# Patient Record
Sex: Female | Born: 1985 | Race: Black or African American | Hispanic: No | Marital: Single | Smoking: Former smoker
Health system: Southern US, Community
[De-identification: ages and names within clinical notes are randomized; demographics above are authoritative.]

## PROBLEM LIST (undated history)

## (undated) ENCOUNTER — Inpatient Hospital Stay (HOSPITAL_COMMUNITY): Payer: Self-pay

## (undated) DIAGNOSIS — I10 Essential (primary) hypertension: Secondary | ICD-10-CM

## (undated) DIAGNOSIS — N200 Calculus of kidney: Secondary | ICD-10-CM

## (undated) DIAGNOSIS — O4400 Placenta previa specified as without hemorrhage, unspecified trimester: Secondary | ICD-10-CM

---
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
## 2005-10-12 ENCOUNTER — Emergency Department (HOSPITAL_COMMUNITY): Admission: EM | Admit: 2005-10-12 | Discharge: 2005-10-12 | Payer: Self-pay | Admitting: Emergency Medicine

## 2006-02-02 ENCOUNTER — Ambulatory Visit (HOSPITAL_COMMUNITY): Admission: RE | Admit: 2006-02-02 | Discharge: 2006-02-02 | Payer: Self-pay | Admitting: Obstetrics & Gynecology

## 2006-02-21 ENCOUNTER — Ambulatory Visit (HOSPITAL_COMMUNITY): Admission: RE | Admit: 2006-02-21 | Discharge: 2006-02-21 | Payer: Self-pay | Admitting: Obstetrics & Gynecology

## 2006-03-19 ENCOUNTER — Emergency Department (HOSPITAL_COMMUNITY): Admission: EM | Admit: 2006-03-19 | Discharge: 2006-03-19 | Payer: Self-pay | Admitting: Emergency Medicine

## 2006-04-02 ENCOUNTER — Emergency Department (HOSPITAL_COMMUNITY): Admission: EM | Admit: 2006-04-02 | Discharge: 2006-04-02 | Payer: Self-pay | Admitting: Emergency Medicine

## 2006-07-10 ENCOUNTER — Ambulatory Visit: Payer: Self-pay | Admitting: *Deleted

## 2006-07-10 ENCOUNTER — Inpatient Hospital Stay (HOSPITAL_COMMUNITY): Admission: AD | Admit: 2006-07-10 | Discharge: 2006-07-10 | Payer: Self-pay | Admitting: Gynecology

## 2006-07-20 ENCOUNTER — Ambulatory Visit: Payer: Self-pay | Admitting: *Deleted

## 2006-07-20 ENCOUNTER — Inpatient Hospital Stay (HOSPITAL_COMMUNITY): Admission: RE | Admit: 2006-07-20 | Discharge: 2006-07-20 | Payer: Self-pay | Admitting: Obstetrics & Gynecology

## 2006-07-22 ENCOUNTER — Inpatient Hospital Stay (HOSPITAL_COMMUNITY): Admission: AD | Admit: 2006-07-22 | Discharge: 2006-07-22 | Payer: Self-pay | Admitting: Family Medicine

## 2006-07-22 ENCOUNTER — Ambulatory Visit: Payer: Self-pay | Admitting: Family Medicine

## 2006-07-23 ENCOUNTER — Inpatient Hospital Stay (HOSPITAL_COMMUNITY): Admission: AD | Admit: 2006-07-23 | Discharge: 2006-07-26 | Payer: Self-pay | Admitting: Family Medicine

## 2006-07-23 ENCOUNTER — Encounter (INDEPENDENT_AMBULATORY_CARE_PROVIDER_SITE_OTHER): Payer: Self-pay | Admitting: *Deleted

## 2006-07-23 ENCOUNTER — Ambulatory Visit: Payer: Self-pay | Admitting: Obstetrics and Gynecology

## 2006-12-27 ENCOUNTER — Emergency Department (HOSPITAL_COMMUNITY): Admission: EM | Admit: 2006-12-27 | Discharge: 2006-12-27 | Payer: Self-pay | Admitting: *Deleted

## 2007-11-20 ENCOUNTER — Emergency Department (HOSPITAL_COMMUNITY): Admission: EM | Admit: 2007-11-20 | Discharge: 2007-11-20 | Payer: Self-pay | Admitting: Emergency Medicine

## 2008-06-16 ENCOUNTER — Emergency Department (HOSPITAL_COMMUNITY): Admission: EM | Admit: 2008-06-16 | Discharge: 2008-06-16 | Payer: Self-pay | Admitting: Emergency Medicine

## 2008-07-22 ENCOUNTER — Emergency Department (HOSPITAL_COMMUNITY): Admission: EM | Admit: 2008-07-22 | Discharge: 2008-07-22 | Payer: Self-pay | Admitting: Family Medicine

## 2008-12-27 ENCOUNTER — Emergency Department (HOSPITAL_COMMUNITY): Admission: EM | Admit: 2008-12-27 | Discharge: 2008-12-27 | Payer: Self-pay | Admitting: Emergency Medicine

## 2009-03-26 ENCOUNTER — Emergency Department (HOSPITAL_COMMUNITY): Admission: EM | Admit: 2009-03-26 | Discharge: 2009-03-26 | Payer: Self-pay | Admitting: Emergency Medicine

## 2009-04-14 ENCOUNTER — Ambulatory Visit (HOSPITAL_COMMUNITY): Admission: RE | Admit: 2009-04-14 | Discharge: 2009-04-14 | Payer: Self-pay | Admitting: Obstetrics

## 2009-05-30 ENCOUNTER — Inpatient Hospital Stay (HOSPITAL_COMMUNITY): Admission: AD | Admit: 2009-05-30 | Discharge: 2009-05-31 | Payer: Self-pay | Admitting: Obstetrics

## 2009-05-31 ENCOUNTER — Ambulatory Visit: Payer: Self-pay | Admitting: Obstetrics and Gynecology

## 2009-05-31 ENCOUNTER — Inpatient Hospital Stay (HOSPITAL_COMMUNITY): Admission: AD | Admit: 2009-05-31 | Discharge: 2009-05-31 | Payer: Self-pay | Admitting: Obstetrics

## 2009-06-18 ENCOUNTER — Inpatient Hospital Stay (HOSPITAL_COMMUNITY): Admission: AD | Admit: 2009-06-18 | Discharge: 2009-06-18 | Payer: Self-pay | Admitting: Obstetrics

## 2009-07-15 ENCOUNTER — Ambulatory Visit (HOSPITAL_COMMUNITY): Admission: RE | Admit: 2009-07-15 | Discharge: 2009-07-15 | Payer: Self-pay | Admitting: Obstetrics

## 2009-08-11 ENCOUNTER — Inpatient Hospital Stay (HOSPITAL_COMMUNITY): Admission: AD | Admit: 2009-08-11 | Discharge: 2009-08-11 | Payer: Self-pay | Admitting: Obstetrics

## 2009-08-12 ENCOUNTER — Encounter (INDEPENDENT_AMBULATORY_CARE_PROVIDER_SITE_OTHER): Payer: Self-pay | Admitting: Obstetrics

## 2009-08-12 ENCOUNTER — Inpatient Hospital Stay (HOSPITAL_COMMUNITY): Admission: AD | Admit: 2009-08-12 | Discharge: 2009-08-14 | Payer: Self-pay | Admitting: Obstetrics

## 2009-09-29 ENCOUNTER — Emergency Department (HOSPITAL_COMMUNITY): Admission: EM | Admit: 2009-09-29 | Discharge: 2009-09-29 | Payer: Self-pay | Admitting: Family Medicine

## 2009-11-10 ENCOUNTER — Emergency Department (HOSPITAL_COMMUNITY): Admission: EM | Admit: 2009-11-10 | Discharge: 2009-11-10 | Payer: Self-pay | Admitting: Emergency Medicine

## 2010-04-25 DEATH — deceased

## 2010-07-13 LAB — CBC
HCT: 24.4 % — ABNORMAL LOW (ref 36.0–46.0)
Hemoglobin: 10 g/dL — ABNORMAL LOW (ref 12.0–15.0)
Hemoglobin: 8.1 g/dL — ABNORMAL LOW (ref 12.0–15.0)
MCHC: 33 g/dL (ref 30.0–36.0)
MCV: 83.7 fL (ref 78.0–100.0)
Platelets: 351 10*3/uL (ref 150–400)
RBC: 2.91 MIL/uL — ABNORMAL LOW (ref 3.87–5.11)
RBC: 3.77 MIL/uL — ABNORMAL LOW (ref 3.87–5.11)
RDW: 16.1 % — ABNORMAL HIGH (ref 11.5–15.5)
WBC: 10.3 10*3/uL (ref 4.0–10.5)

## 2010-07-14 LAB — URINALYSIS, ROUTINE W REFLEX MICROSCOPIC
Bilirubin Urine: NEGATIVE
Glucose, UA: 100 mg/dL — AB
Hgb urine dipstick: NEGATIVE
Ketones, ur: 15 mg/dL — AB
Ketones, ur: NEGATIVE mg/dL
Nitrite: NEGATIVE
Protein, ur: 30 mg/dL — AB
Protein, ur: NEGATIVE mg/dL
Specific Gravity, Urine: 1.02 (ref 1.005–1.030)

## 2010-07-14 LAB — URINE CULTURE: Colony Count: 60000

## 2010-07-14 LAB — URINE MICROSCOPIC-ADD ON

## 2010-07-14 LAB — WET PREP, GENITAL

## 2010-07-14 LAB — CBC
Hemoglobin: 9.7 g/dL — ABNORMAL LOW (ref 12.0–15.0)
MCHC: 32.2 g/dL (ref 30.0–36.0)
RBC: 3.34 MIL/uL — ABNORMAL LOW (ref 3.87–5.11)
WBC: 12.2 10*3/uL — ABNORMAL HIGH (ref 4.0–10.5)

## 2010-07-14 LAB — FETAL FIBRONECTIN: Fetal Fibronectin: NEGATIVE

## 2010-07-18 ENCOUNTER — Emergency Department (HOSPITAL_COMMUNITY)
Admission: EM | Admit: 2010-07-18 | Discharge: 2010-07-18 | Disposition: A | Discharge status: Home / Self Care | Payer: Self-pay | Attending: Emergency Medicine | Admitting: Emergency Medicine

## 2010-07-18 DIAGNOSIS — K029 Dental caries, unspecified: Secondary | ICD-10-CM | POA: Insufficient documentation

## 2010-07-18 DIAGNOSIS — K089 Disorder of teeth and supporting structures, unspecified: Secondary | ICD-10-CM | POA: Insufficient documentation

## 2010-07-29 IMAGING — US US OB FOLLOW-UP
1 series · 10 of 10 positions shown · non-contrast
Comparison: none

OBSTETRICAL ULTRASOUND:
 This ultrasound exam was performed in the [HOSPITAL] Ultrasound Department.  The OB US report was generated in the AS system, and faxed to the ordering physician.  This report is also available in [HOSPITAL]?s Access[employer] and in [REDACTED] PACS.

[Series 1: us ob follow-up · 0.21mm/px · 10 of 10 slices shown]
[im 1/10]
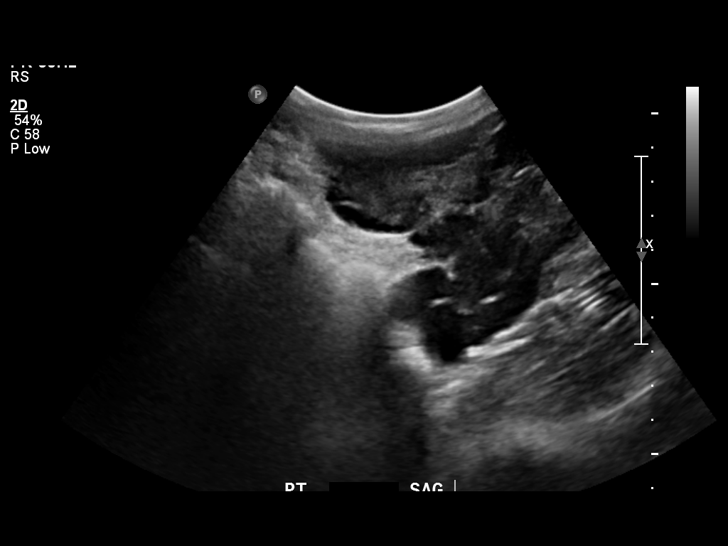
[im 2/10]
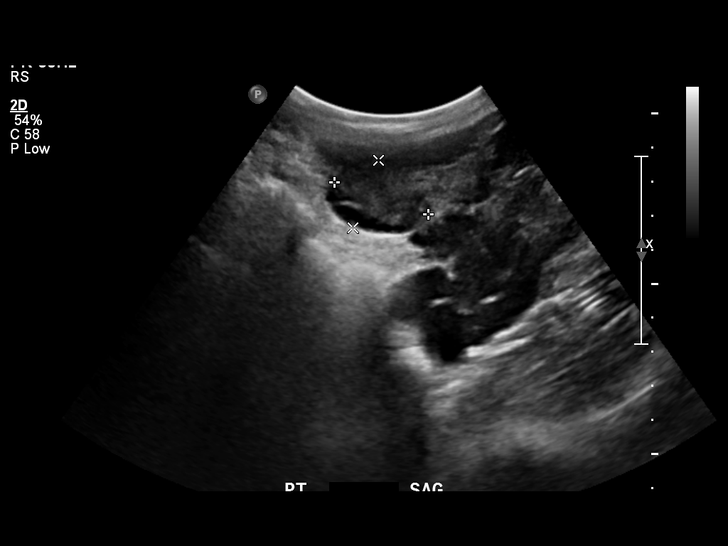
[im 3/10]
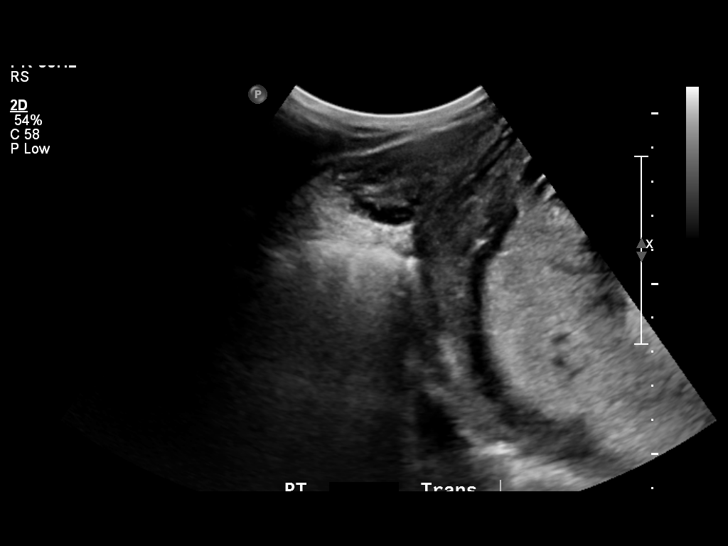
[im 4/10]
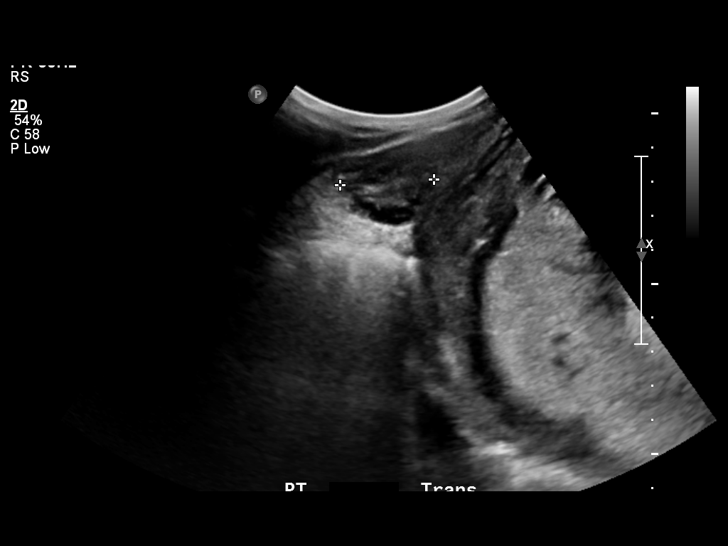
[im 5/10]
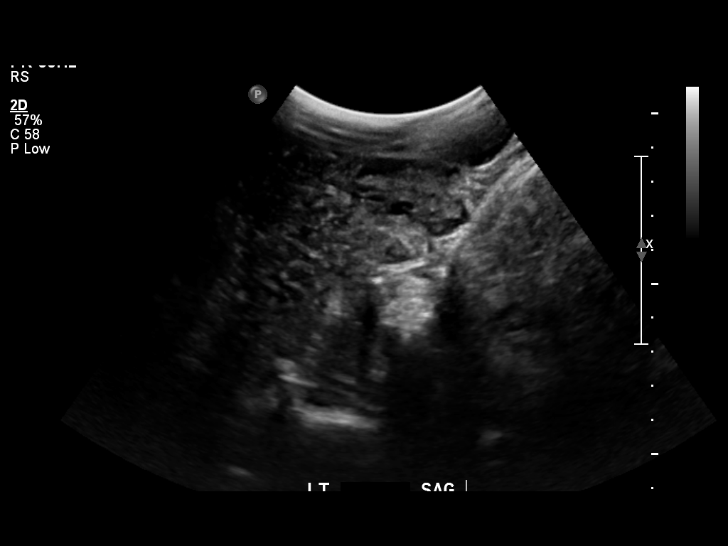
[im 6/10]
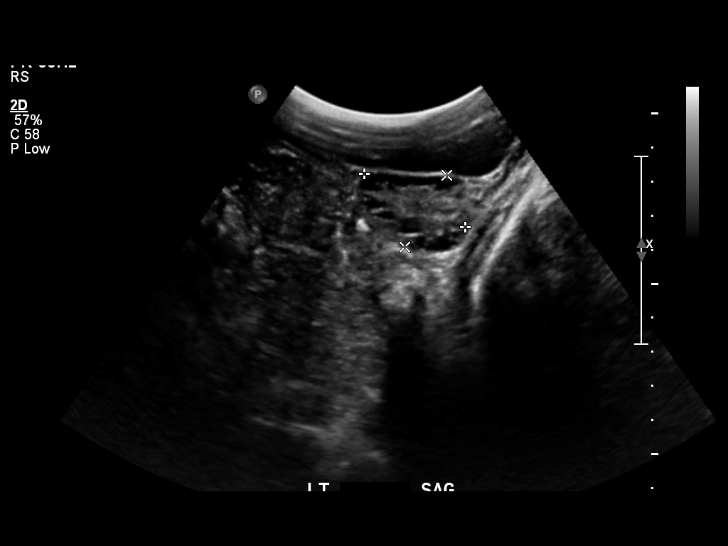
[im 7/10]
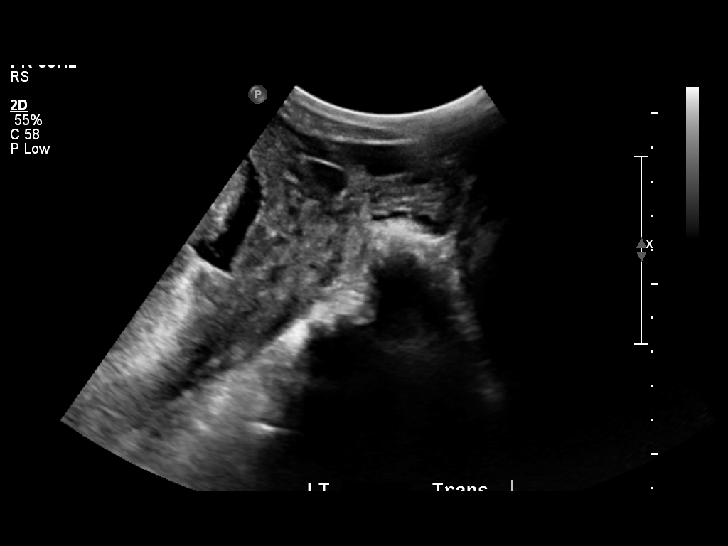
[im 8/10]
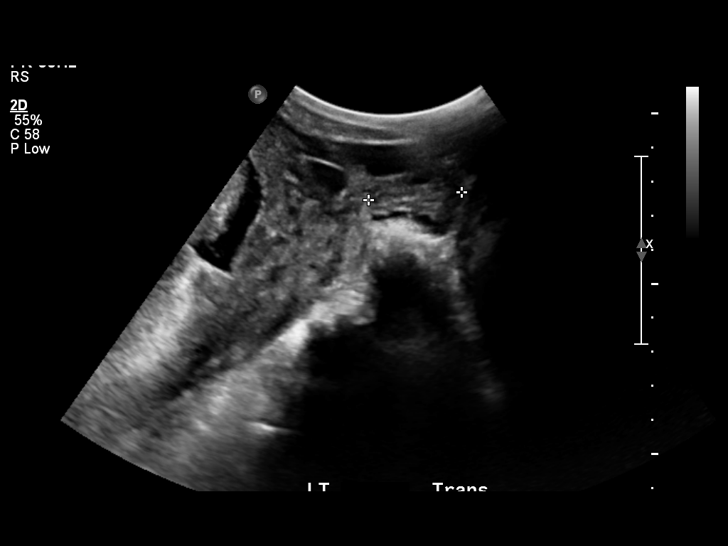
[im 9/10]
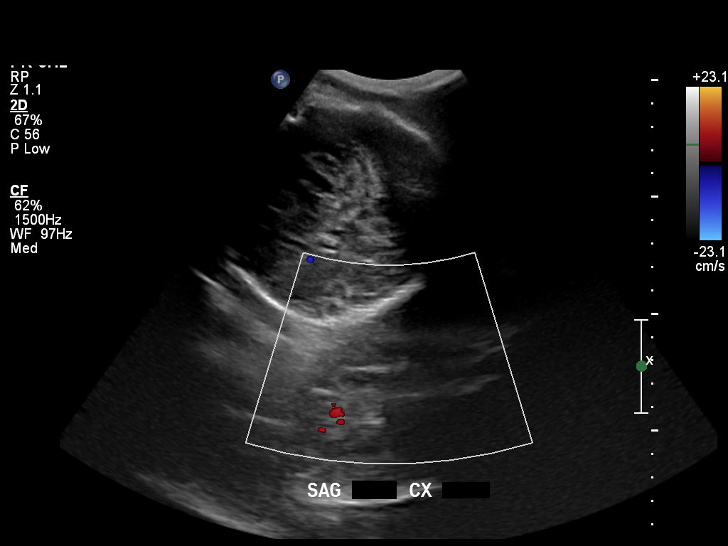
[im 10/10]
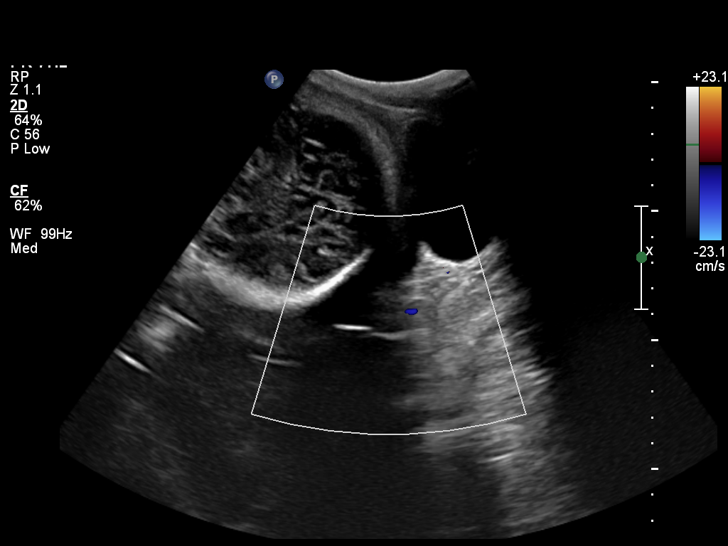

[10 of 10 positions shown; findings below may reference images not displayed]

IMPRESSION: See AS Obstetric US report.

## 2010-07-30 LAB — COMPREHENSIVE METABOLIC PANEL
ALT: 21 U/L (ref 0–35)
AST: 22 U/L (ref 0–37)
Alkaline Phosphatase: 71 U/L (ref 39–117)
Calcium: 9.2 mg/dL (ref 8.4–10.5)
GFR calc non Af Amer: >60 mL/min (ref >60)
Potassium: 4.1 mEq/L (ref 3.5–5.1)
Total Bilirubin: 0.2 mg/dL — ABNORMAL LOW (ref 0.3–1.2)
Total Protein: 7.2 g/dL (ref 6.0–8.3)

## 2010-07-30 LAB — CBC
Hemoglobin: 11.5 g/dL — ABNORMAL LOW (ref 12.0–15.0)
MCHC: 32.5 g/dL (ref 30.0–36.0)
Platelets: 330 10*3/uL (ref 150–400)
RBC: 4.08 MIL/uL (ref 3.87–5.11)
WBC: 9.9 10*3/uL (ref 4.0–10.5)

## 2010-07-30 LAB — URINALYSIS, ROUTINE W REFLEX MICROSCOPIC
Bilirubin Urine: NEGATIVE
Nitrite: NEGATIVE
Protein, ur: NEGATIVE mg/dL
Specific Gravity, Urine: 1.028 (ref 1.005–1.030)

## 2010-07-30 LAB — DIFFERENTIAL
Basophils Absolute: 0 10*3/uL (ref 0.0–0.1)
Eosinophils Absolute: 0.2 10*3/uL (ref 0.0–0.7)
Eosinophils Relative: 2 % (ref 0–5)
Monocytes Absolute: 0.7 10*3/uL (ref 0.1–1.0)

## 2010-07-30 LAB — WET PREP, GENITAL

## 2010-07-30 LAB — URINE CULTURE

## 2010-07-30 LAB — GC/CHLAMYDIA PROBE AMP, GENITAL
Chlamydia, DNA Probe: NEGATIVE
GC Probe Amp, Genital: NEGATIVE

## 2010-07-30 LAB — HCG, QUANTITATIVE, PREGNANCY: hCG, Beta Chain, Quant, S: 53400 m[IU]/mL — ABNORMAL HIGH (ref <5)

## 2010-08-10 LAB — POCT PREGNANCY, URINE: Preg Test, Ur: NEGATIVE

## 2010-09-10 NOTE — Discharge Summary (Signed)
NAME:  [name] Hendrix, [name] Hendrix             ACCOUNT NO.:  [id number]   MEDICAL RECORD NO.:  [id number]          PATIENT TYPE:  INP   LOCATION:  9106                          FACILITY:  [facility]   PHYSICIAN:  Ginger Carne, MD  DATE OF BIRTH:  05-Aug-1985   DATE OF ADMISSION:  07/23/2006  DATE OF DISCHARGE:  07/26/2006                               DISCHARGE SUMMARY   DISCHARGE DIAGNOSES:  1. Intrauterine pregnancy at 40.3 weeks with a low transverse Cesarean      section for nonreassuring fetal heart tracing. The patient was      dilated to 7 cm.  2. Polyhydramnios.  3. Group B streptococci positive.   PROCEDURES:  1. Low transverse Cesarean section.  2. Amnioinfusion for late decelerations.  3. Penicillin for GBS positive.   LABORATORY DATA:  Predelivery hemoglobin 12.5, postdelivery 10.3. RPR  nonreactive. Placenta pathology showed three-vessel mature placenta with  chorioamnionitis. GBS positive. O positive. Rubella immune. HIV  negative.   REASON FOR ADMISSION:  Onset of labor.   HOSPITAL COURSE:  A 25 year old G1 at 40.3 weeks presented in labor and  dilated to 7 cm but began having nonreassuring fetal heart tracing  despite oxygen, amnioinfusion and position changes. She subsequently  went for a low transverse Cesarean section that revealed a very floppy  infant female with Apgars of 4 at one minute and 9 at five minutes along  with hyper-spiraled cord. The patient was found to have polyhydramnios  prenatally. The placenta was sent for pathology and was normal except  for chorioamnionitis. Please see the OR note for details along with the  pathology report. Postoperative, the patient has done extremely well  without complaints, and her pain has been well controlled with pain  medications. She has been afebrile and ambulating and taking excellent  p.o. She is bottle feeding exclusively. Depo was given prior to  discharge. She was GBS positive and status post penicillin. O  positive.  Rubella immune. Staples will be removed by the Siri Cole nurse on April  4 or April 5. The patient was instructed to observe pelvic rest and no  heavy lifting x6 weeks. She was given a prescription for Percocet,  ibuprofen, prenatal  vitamins and Colace. The female baby was without complications and will  go home with the mother. She is to [facility] up [facility] the [facility] in  6 weeks. She was given precautions for returning to the MAU in the event  that she does develop a fever or foul-smelling discharge or increased  abdominal pain.     ______________________________  Lupita Raider, M.D.      Ginger Carne, MD  Electronically Signed    KS/MEDQ  D:  07/26/2006  T:  07/26/2006  Job:  [id number]

## 2010-09-10 NOTE — Op Note (Signed)
NAME:  [name] Hendrix, [name] Hendrix             ACCOUNT NO.:  [id number]   MEDICAL RECORD NO.:  [id number]          PATIENT TYPE:  INP   LOCATION:  9106                          FACILITY:  [facility]   PHYSICIAN:  Phil D. Okey Dupre, M.D.     DATE OF BIRTH:  10-22-1985   DATE OF PROCEDURE:  07/23/2006  DATE OF DISCHARGE:                               OPERATIVE REPORT   PREOPERATIVE DIAGNOSIS:  IUP at term, nonreassuring fetal heart  tracings, polyhydramnios.   POSTOPERATIVE DIAGNOSIS:  IUP at term, nonreassuring fetal heart  tracings, polyhydramnios.   PROCEDURE:  Primary low transverse cesarean section.   SURGEON:  Dr. Argentina Donovan   ASSISTANT:  Dr. Wilburt Finlay   ANESTHESIA:  General.   SPECIMEN:  Placenta to pathology.   ESTIMATED BLOOD LOSS:  800 mL.   COMPLICATIONS:  None.   FINDINGS:  Hyperspiral cord, viable female infant, Apgars 4 and 9 at 1  and 5 minutes respectively.  Cord pH 7.39.  Birth weight 7 pounds 0  ounces.   INDICATIONS FOR PROCEDURE:  This is a 25 year old gravida 1 para 0 who  presented in spontaneous onset of labor, progressed to 7 cm dilation,  and developed nonreassuring fetal heart tones. She was taken for primary  cesarean section.   PROCEDURE:  The patient was taken to the operating room where her  epidural anesthesia was found to be inadequate.  She was then put under  general anesthesia. She was prepped and draped in the normal sterile  fashion in the dorsal supine position with leftward tilt.  Pfannenstiel  skin incision was then made with scalpel and carried through to the  underlying layer of fascia.  The fascia was incised in the midline and  the incision extended laterally with the Mayo scissors.  Superior aspect  of the fascial incision was then grasped with Kocher clamps, elevated  and underlying rectus muscles dissected off bluntly.  Attention was then  turned to the inferior aspect of the incision which in a similar fashion  was grasped, tented up with  Kocher clamps and the rectus muscles  dissected off bluntly.  The rectus muscles were then separated in the  midline and the peritoneum identified, tented up and entered sharply  with the Metzenbaum scissors.  The peritoneal incision was then extended  superiorly and inferiorly with good visualization of the bladder.  The  bladder blade was then inserted and the vesicouterine peritoneum  identified, grasped with pickups, and entered sharply with the  Metzenbaum scissors.  This incision was then extended laterally and a  bladder flap created digitally.  The bladder blade was then reinserted  and the lower uterine segment incised in a transverse fashion with the  scalpel.  The uterine incision was then extended laterally. The bladder  blade was removed and the infant's head was found to be out of the  maternal pelvis, deflexed. Vacuum was placed with delivery of the  infant's head via one pull. The nose and mouth were suctioned and the  cord clamped and cut.  The infant was handed off to the waiting  pediatricians.  Cord  gases and cord blood was sent. The placenta was  then removed spontaneously.  The uterus cleared of all clots and debris.  The uterine incision was repaired with a 1-0 chromic in a running locked  fashion. This incision was inspected several times with excellent  hemostasis noted.  The gutters were cleared of all clots and the fascia  was reapproximated with 0 Vicryl in a running fashion.  The skin was  closed with staples.  The patient tolerated the procedure well.  Sponge,  lap and needle counts were correct x2.  The patient was taken to the  recovery room in stable condition.     ______________________________  Paticia Stack, MD    ______________________________  Javier Glazier Okey Dupre, M.D.    Alphia Kava  D:  07/23/2006  T:  07/23/2006  Job:  [id number]

## 2010-09-25 ENCOUNTER — Emergency Department (HOSPITAL_COMMUNITY)
Admission: EM | Admit: 2010-09-25 | Discharge: 2010-09-25 | Disposition: A | Discharge status: Home / Self Care | Payer: Self-pay | Attending: Emergency Medicine | Admitting: Emergency Medicine

## 2010-09-25 DIAGNOSIS — R059 Cough, unspecified: Secondary | ICD-10-CM | POA: Insufficient documentation

## 2010-09-25 DIAGNOSIS — R05 Cough: Secondary | ICD-10-CM | POA: Insufficient documentation

## 2010-09-25 DIAGNOSIS — J40 Bronchitis, not specified as acute or chronic: Secondary | ICD-10-CM | POA: Insufficient documentation

## 2010-12-16 ENCOUNTER — Emergency Department (HOSPITAL_COMMUNITY)
Admission: EM | Admit: 2010-12-16 | Discharge: 2010-12-16 | Disposition: A | Discharge status: Home / Self Care | Payer: Self-pay | Attending: Emergency Medicine | Admitting: Emergency Medicine

## 2010-12-16 DIAGNOSIS — Z202 Contact with and (suspected) exposure to infections with a predominantly sexual mode of transmission: Secondary | ICD-10-CM | POA: Insufficient documentation

## 2010-12-16 LAB — URINE MICROSCOPIC-ADD ON

## 2010-12-16 LAB — URINALYSIS, ROUTINE W REFLEX MICROSCOPIC
Hgb urine dipstick: NEGATIVE
Ketones, ur: NEGATIVE mg/dL
Protein, ur: NEGATIVE mg/dL
Urobilinogen, UA: 1 mg/dL (ref 0.0–1.0)

## 2010-12-20 LAB — URINE CULTURE
Colony Count: >=100000
Culture  Setup Time: 201208240723

## 2011-01-17 ENCOUNTER — Emergency Department (HOSPITAL_COMMUNITY): Payer: Self-pay

## 2011-01-17 ENCOUNTER — Emergency Department (HOSPITAL_COMMUNITY)
Admission: EM | Admit: 2011-01-17 | Discharge: 2011-01-18 | Disposition: A | Discharge status: Home / Self Care | Payer: Self-pay | Attending: Emergency Medicine | Admitting: Emergency Medicine

## 2011-01-17 DIAGNOSIS — R630 Anorexia: Secondary | ICD-10-CM | POA: Insufficient documentation

## 2011-01-17 DIAGNOSIS — N390 Urinary tract infection, site not specified: Secondary | ICD-10-CM | POA: Insufficient documentation

## 2011-01-17 DIAGNOSIS — F17200 Nicotine dependence, unspecified, uncomplicated: Secondary | ICD-10-CM | POA: Insufficient documentation

## 2011-01-17 DIAGNOSIS — R509 Fever, unspecified: Secondary | ICD-10-CM | POA: Insufficient documentation

## 2011-01-17 DIAGNOSIS — IMO0001 Reserved for inherently not codable concepts without codable children: Secondary | ICD-10-CM | POA: Insufficient documentation

## 2011-01-17 LAB — URINALYSIS, ROUTINE W REFLEX MICROSCOPIC
Glucose, UA: NEGATIVE mg/dL
Ketones, ur: 15 mg/dL — AB
Nitrite: POSITIVE — AB
Protein, ur: NEGATIVE mg/dL
pH: 5.5 (ref 5.0–8.0)

## 2011-01-17 LAB — DIFFERENTIAL
Eosinophils Absolute: 0.1 10*3/uL (ref 0.0–0.7)
Eosinophils Relative: 0 % (ref 0–5)
Lymphocytes Relative: 13 % (ref 12–46)
Lymphs Abs: 2.2 10*3/uL (ref 0.7–4.0)
Monocytes Absolute: 2 10*3/uL — ABNORMAL HIGH (ref 0.1–1.0)
Monocytes Relative: 12 % (ref 3–12)

## 2011-01-17 LAB — CBC
HCT: 34.5 % — ABNORMAL LOW (ref 36.0–46.0)
MCH: 31.6 pg (ref 26.0–34.0)
MCHC: 34.8 g/dL (ref 30.0–36.0)
MCV: 90.8 fL (ref 78.0–100.0)
Platelets: 237 10*3/uL (ref 150–400)
RDW: 12.7 % (ref 11.5–15.5)

## 2011-01-17 LAB — COMPREHENSIVE METABOLIC PANEL
AST: 13 U/L (ref 0–37)
Albumin: 3.8 g/dL (ref 3.5–5.2)
BUN: 9 mg/dL (ref 6–23)
Calcium: 9.6 mg/dL (ref 8.4–10.5)
Chloride: 96 mEq/L (ref 96–112)
Creatinine, Ser: 0.92 mg/dL (ref 0.50–1.10)
Total Bilirubin: 0.4 mg/dL (ref 0.3–1.2)

## 2011-01-17 LAB — URINE MICROSCOPIC-ADD ON

## 2011-01-17 LAB — POCT PREGNANCY, URINE: Preg Test, Ur: NEGATIVE

## 2011-01-18 LAB — URINE MICROSCOPIC-ADD ON

## 2011-01-18 LAB — URINALYSIS, ROUTINE W REFLEX MICROSCOPIC
Bilirubin Urine: NEGATIVE
Glucose, UA: NEGATIVE mg/dL
Specific Gravity, Urine: 1.014 (ref 1.005–1.030)

## 2011-01-19 LAB — URINE CULTURE

## 2011-02-04 LAB — I-STAT 8, (EC8 V) (CONVERTED LAB)
BUN: 9
Chloride: 107
Glucose, Bld: 87
HCT: 46
Hemoglobin: 15.6 — ABNORMAL HIGH
Operator id: 277751
Potassium: 3.9
Sodium: 140

## 2011-02-04 LAB — CBC
HCT: 41.9
Hemoglobin: 14.3
MCHC: 34
MCV: 91.7
RBC: 4.57
WBC: 6.7

## 2011-02-04 LAB — DIFFERENTIAL
Eosinophils Absolute: 0.2
Eosinophils Relative: 3
Lymphs Abs: 3.3
Monocytes Absolute: 0.4
Monocytes Relative: 7

## 2011-02-04 LAB — URINALYSIS, ROUTINE W REFLEX MICROSCOPIC
Nitrite: NEGATIVE
Protein, ur: 30 — AB
Specific Gravity, Urine: 1.026
Urobilinogen, UA: 1

## 2011-02-04 LAB — URINE MICROSCOPIC-ADD ON

## 2011-03-08 ENCOUNTER — Encounter: Payer: Self-pay | Admitting: *Deleted

## 2011-03-08 ENCOUNTER — Emergency Department (HOSPITAL_COMMUNITY)
Admission: EM | Admit: 2011-03-08 | Discharge: 2011-03-08 | Disposition: A | Discharge status: Home / Self Care | Payer: Self-pay | Attending: Emergency Medicine | Admitting: Emergency Medicine

## 2011-03-08 DIAGNOSIS — R112 Nausea with vomiting, unspecified: Secondary | ICD-10-CM | POA: Insufficient documentation

## 2011-03-08 DIAGNOSIS — R1031 Right lower quadrant pain: Secondary | ICD-10-CM | POA: Insufficient documentation

## 2011-03-08 DIAGNOSIS — N10 Acute tubulo-interstitial nephritis: Secondary | ICD-10-CM | POA: Insufficient documentation

## 2011-03-08 HISTORY — DX: Calculus of kidney: N20.0

## 2011-03-08 LAB — WET PREP, GENITAL: Trich, Wet Prep: NONE SEEN

## 2011-03-08 LAB — PREGNANCY, URINE: Preg Test, Ur: NEGATIVE

## 2011-03-08 LAB — URINALYSIS, ROUTINE W REFLEX MICROSCOPIC
Bilirubin Urine: NEGATIVE
Glucose, UA: NEGATIVE mg/dL
Nitrite: POSITIVE — AB
Specific Gravity, Urine: 1.014 (ref 1.005–1.030)
pH: 6 (ref 5.0–8.0)

## 2011-03-08 LAB — URINE MICROSCOPIC-ADD ON

## 2011-03-08 MED ORDER — KETOROLAC TROMETHAMINE 30 MG/ML IJ SOLN
30.0000 mg | Freq: Once | INTRAMUSCULAR | Status: AC
  Administered 2011-03-08: 30 mg via INTRAMUSCULAR
  Filled 2011-03-08: qty 1

## 2011-03-08 MED ORDER — CIPROFLOXACIN HCL 500 MG PO TABS: 500.0000 mg | ORAL_TABLET | Freq: Two times a day (BID) | ORAL | Status: AC

## 2011-03-08 MED ORDER — CIPROFLOXACIN HCL 500 MG PO TABS
500.0000 mg | ORAL_TABLET | Freq: Once | ORAL | Status: AC
  Administered 2011-03-08: 500 mg via ORAL
  Filled 2011-03-08: qty 1

## 2011-03-08 MED ORDER — ONDANSETRON 8 MG PO TBDP
8.0000 mg | ORAL_TABLET | Freq: Once | ORAL | Status: AC
  Administered 2011-03-08: 8 mg via ORAL
  Filled 2011-03-08: qty 1

## 2011-03-08 NOTE — ED Provider Notes (Signed)
History     CSN: [id number] Arrival date & time: 03/08/2011  1:38 AM   First MD Initiated Contact with Patient 03/08/11 [phone]      Chief Complaint  Patient presents with  . Abdominal Pain    (Consider location/radiation/quality/duration/timing/severity/associated sxs/prior treatment) HPI Comments: Patient presents complaining of right lower quadrant abdominal pain that began at approximately midnight tonight.  Patient has had one episode of associated nausea and vomiting.  She states she's otherwise had normal bowel movements.  No dysuria or hematuria noted.  Patient states her last normal menstrual period was October 19.  She then had a second period October 31 which was unusual for her.  She is sexually active without protection with her partner.  She denies any vaginal discharge.  She does not have any current vaginal bleeding.  No prior abdominal surgeries.  No prior ectopic pregnancies.  She has not had similar pain.  No prior history for ovarian cysts.  Patient is a 25 y.o. female presenting with abdominal pain. The history is provided by the patient. No language interpreter was used.  Abdominal Pain The primary symptoms of the illness include abdominal pain, nausea and vomiting. The primary symptoms of the illness do not include fever, fatigue, shortness of breath, diarrhea, hematemesis, hematochezia, dysuria, vaginal discharge or vaginal bleeding. The current episode started 6 to 12 hours ago. The onset of the illness was gradual. The problem has not changed since onset. Symptoms associated with the illness do not include chills or back pain.    Past Medical History  Diagnosis Date  . Kidney stones     History reviewed. No pertinent past surgical history.  History reviewed. No pertinent family history.  History  Substance Use Topics  . Smoking status: Not on file  . Smokeless tobacco: Not on file  . Alcohol Use:     OB History    Grav Para Term Preterm Abortions TAB SAB  Ect Mult Living                  Review of Systems  Constitutional: Negative.  Negative for fever, chills and fatigue.  HENT: Negative.   Eyes: Negative.  Negative for discharge and redness.  Respiratory: Negative.  Negative for cough and shortness of breath.   Cardiovascular: Negative.  Negative for chest pain.  Gastrointestinal: Positive for nausea, vomiting and abdominal pain. Negative for diarrhea, hematochezia and hematemesis.  Genitourinary: Positive for menstrual problem. Negative for dysuria, vaginal bleeding and vaginal discharge.  Musculoskeletal: Negative.  Negative for back pain.  Skin: Negative.  Negative for color change and rash.  Neurological: Negative.  Negative for syncope and headaches.  Hematological: Negative.  Negative for adenopathy.  Psychiatric/Behavioral: Negative.  Negative for confusion.  All other systems reviewed and are negative.    Allergies  Review of patient's allergies indicates no known allergies.  Home Medications  No current outpatient prescriptions on file.  BP 112/67  Pulse 65  Temp(Src) 97.9 F (36.6 C) (Oral)  Resp 18  SpO2 100%  Physical Exam  Constitutional: She is oriented to person, place, and time. She appears well-developed and well-nourished.  Non-toxic appearance. She does not have a sickly appearance.  HENT:  Head: Normocephalic and atraumatic.  Eyes: Conjunctivae, EOM and lids are normal. Pupils are equal, round, and reactive to light. No scleral icterus.  Neck: Trachea normal and normal range of motion. Neck supple.  Cardiovascular: Normal rate, regular rhythm and normal heart sounds.  Exam reveals no gallop and no  friction rub.   No murmur heard. Pulmonary/Chest: Effort normal and breath sounds normal. No respiratory distress. She has no wheezes. She has no rales. She exhibits no tenderness.  Abdominal: Soft. Normal appearance. There is no tenderness. There is CVA tenderness. There is no rebound and no guarding.        Right CVA tenderness.  Genitourinary: Vagina normal. There is no rash, tenderness or lesion on the right labia. There is no rash, tenderness or lesion on the left labia. No erythema, tenderness or bleeding around the vagina. No foreign body around the vagina. No signs of injury around the vagina. No vaginal discharge found.       Examination chaperoned by Verlon Au, RN.  Patient with normal exam.  No cervical motion tenderness.  No vaginal discharge.  No bleeding.  No lesions.  No tenderness on exam.  Musculoskeletal: Normal range of motion.  Neurological: She is alert and oriented to person, place, and time. She has normal strength.  Skin: Skin is warm, dry and intact. No rash noted.  Psychiatric: She has a normal mood and affect. Her behavior is normal. Judgment and thought content normal.    ED Course  Procedures (including critical care time)  Results for orders placed during the hospital encounter of 03/08/11  URINALYSIS, ROUTINE W REFLEX MICROSCOPIC      Component Value Range   Color, Urine YELLOW  YELLOW    Appearance CLOUDY (*) CLEAR    Specific Gravity, Urine 1.014  1.005 - 1.030    pH 6.0  5.0 - 8.0    Glucose, UA NEGATIVE  NEGATIVE (mg/dL)   Hgb urine dipstick LARGE (*) NEGATIVE    Bilirubin Urine NEGATIVE  NEGATIVE    Ketones, ur NEGATIVE  NEGATIVE (mg/dL)   Protein, ur 30 (*) NEGATIVE (mg/dL)   Urobilinogen, UA 1.0  0.0 - 1.0 (mg/dL)   Nitrite POSITIVE (*) NEGATIVE    Leukocytes, UA SMALL (*) NEGATIVE   URINE MICROSCOPIC-ADD ON      Component Value Range   Squamous Epithelial / LPF FEW (*) RARE    WBC, UA 21-50  <3 (WBC/hpf)   RBC / HPF TOO NUMEROUS TO COUNT  <3 (RBC/hpf)   Bacteria, UA MANY (*) RARE   PREGNANCY, URINE      Component Value Range   Preg Test, Ur NEGATIVE     No results found.    MDM  Patient with likely pyelonephritis given her presentation of right lower abdominal pain and radiation to her right flank on exam.  She has a urinalysis that is  consistent with UTI.  She is not pregnant at this time.  She shows no physical exam signs consistent with cervicitis or PID.  Patient will be discharged with ciprofloxacin for one week and has been instructed to return if she is unable to keep her antibiotics down or is worsening symptoms and understands this at time of discharge.        Nat Christen, MD 03/08/11 [phone]

## 2011-03-08 NOTE — ED Notes (Signed)
Pt in c/o RLQ abd pain x30 min with no n/v

## 2011-03-08 NOTE — ED Notes (Signed)
Pt informed of need for pelvic exam, material needed for pelvic is set up ad ready

## 2011-03-08 NOTE — ED Notes (Signed)
Pt c/o RLQ pain radiating to R lower back and vomiting starting 0030 this am, vomit x2, pain w/gas, pt reports pain started after having sex w/her fiance. Hx of kidney stones, denies hx of ovarian cysts. Reports vomit x2.

## 2011-03-08 NOTE — ED Notes (Signed)
EDP [facility] informed pt is ready for pelvic

## 2011-03-09 LAB — GC/CHLAMYDIA PROBE AMP, GENITAL: Chlamydia, DNA Probe: NEGATIVE

## 2012-02-28 ENCOUNTER — Emergency Department (HOSPITAL_COMMUNITY)
Admission: EM | Admit: 2012-02-28 | Discharge: 2012-02-28 | Disposition: A | Discharge status: Home / Self Care | Payer: Self-pay | Attending: Emergency Medicine | Admitting: Emergency Medicine

## 2012-02-28 ENCOUNTER — Encounter (HOSPITAL_COMMUNITY): Payer: Self-pay

## 2012-02-28 DIAGNOSIS — K089 Disorder of teeth and supporting structures, unspecified: Secondary | ICD-10-CM | POA: Insufficient documentation

## 2012-02-28 DIAGNOSIS — Z87442 Personal history of urinary calculi: Secondary | ICD-10-CM | POA: Insufficient documentation

## 2012-02-28 DIAGNOSIS — F17200 Nicotine dependence, unspecified, uncomplicated: Secondary | ICD-10-CM | POA: Insufficient documentation

## 2012-02-28 DIAGNOSIS — K0889 Other specified disorders of teeth and supporting structures: Secondary | ICD-10-CM

## 2012-02-28 MED ORDER — PENICILLIN V POTASSIUM 500 MG PO TABS: 500.0000 mg | ORAL_TABLET | Freq: Three times a day (TID) | ORAL | Status: DC

## 2012-02-28 MED ORDER — OXYCODONE-ACETAMINOPHEN 5-325 MG PO TABS: 2.0000 | ORAL_TABLET | ORAL | Status: DC | PRN

## 2012-02-28 MED ORDER — OXYCODONE-ACETAMINOPHEN 5-325 MG PO TABS: 2.0000 | ORAL_TABLET | Freq: Once | ORAL | Status: DC

## 2012-02-28 NOTE — ED Provider Notes (Signed)
History     CSN: [id number]  Arrival date & time 02/28/12  1031   First MD Initiated Contact with Patient 02/28/12 1125      Chief Complaint  Patient presents with  . Dental Pain    (Consider location/radiation/quality/duration/timing/severity/associated sxs/prior treatment) HPI Comments: The patient is a 26 year old otherwise healthy female who presents with dental pain that started gradually one week ago. The dental pain is severe, constant and progressively worsening. The pain is aching and located in her upper left jaw. The pain does not radiate. Eating makes the pain worse. Nothing makes the pain better. The patient has not tried anything for pain. No associated symptoms. Patient denies headache, neck pain/stiffness, fever, NVD, edema, sore throat, throat swelling, wheezing, SOB, chest pain, abdominal pain.     Patient is a 26 y.o. female presenting with tooth pain.  Dental Pain   Past Medical History  Diagnosis Date  . Kidney stones     History reviewed. No pertinent past surgical history.  No family history on file.  History  Substance Use Topics  . Smoking status: Current Every Day Smoker  . Smokeless tobacco: Not on file  . Alcohol Use: No    OB History    Grav Para Term Preterm Abortions TAB SAB Ect Mult Living                  Review of Systems  HENT: Positive for dental problem.   All other systems reviewed and are negative.    Allergies  Review of patient's allergies indicates no known allergies.  Home Medications   Current Outpatient Rx  Name  Route  Sig  Dispense  Refill  . IBUPROFEN 200 MG PO TABS   Oral   Take 400 mg by mouth every 6 (six) hours as needed. For pain         . ADULT MULTIVITAMIN W/MINERALS CH   Oral   Take 1 tablet by mouth daily.         . OXYCODONE-ACETAMINOPHEN 5-325 MG PO TABS   Oral   Take 2 tablets by mouth every 4 (four) hours as needed for pain.   10 tablet   0   . PENICILLIN V POTASSIUM 500 MG PO  TABS   Oral   Take 1 tablet (500 mg total) by mouth 3 (three) times daily.   30 tablet   0     BP 115/71  Pulse 84  Temp 97.2 F (36.2 C) (Oral)  Resp 18  SpO2 100%  Physical Exam  Nursing note and vitals reviewed. Constitutional: She is oriented to person, place, and time. She appears well-developed and well-nourished. No distress.  HENT:  Head: Normocephalic and atraumatic.  Nose: Nose normal.  Mouth/Throat: Oropharynx is clear and moist. No oropharyngeal exudate.         Mild generalized left side facial swelling and tenderness to palpation most notable at zygomatic process. Poor dentition with multiple cracked teeth and tenderness to percussion at teeth marked on picture.   Eyes: Conjunctivae normal and EOM are normal. Pupils are equal, round, and reactive to light. No scleral icterus.  Neck: Normal range of motion. Neck supple.  Cardiovascular: Normal rate and regular rhythm.  Exam reveals no gallop and no friction rub.   No murmur heard. Pulmonary/Chest: Effort normal and breath sounds normal. She has no wheezes. She has no rales. She exhibits no tenderness.  Abdominal: Soft. She exhibits no distension.  Musculoskeletal: Normal range of motion.  Neurological: She is alert and oriented to person, place, and time. Coordination normal.       Speech is goal-oriented. Moves limbs without ataxia.   Skin: Skin is warm and dry. She is not diaphoretic.  Psychiatric: She has a normal mood and affect. Her behavior is normal.    ED Course  Procedures (including critical care time)  Labs Reviewed - No data to display No results found.   1. Pain, dental       MDM  11:42 AM Patient will have Percocet here for pain. Patient will be discharged with 10 day course of Pen VK and Percocet. Patient instructed to follow up with the dentist recommended within 24 hours to guarantee a visit. Patient should return with worsening or concerning symptoms.         Emilia Beck, PA-C 02/28/12 1148

## 2012-02-28 NOTE — ED Notes (Signed)
Pt has been having tooth pain with associated left eye and left lip pain.  Pt also reporting broken tooth on upper left side.  Pt reports pain when driving and laying flat.

## 2012-03-02 NOTE — ED Provider Notes (Signed)
Medical screening examination/treatment/procedure(s) were performed by non-physician practitioner and as supervising physician I was immediately available for consultation/collaboration.  Tobin Chad, MD 03/02/12 2312

## 2012-03-05 ENCOUNTER — Emergency Department (HOSPITAL_COMMUNITY)
Admission: EM | Admit: 2012-03-05 | Discharge: 2012-03-05 | Disposition: A | Discharge status: Home / Self Care | Payer: Self-pay | Attending: Emergency Medicine | Admitting: Emergency Medicine

## 2012-03-05 ENCOUNTER — Emergency Department (HOSPITAL_COMMUNITY): Payer: Self-pay

## 2012-03-05 ENCOUNTER — Encounter (HOSPITAL_COMMUNITY): Payer: Self-pay | Admitting: Family Medicine

## 2012-03-05 DIAGNOSIS — Z87442 Personal history of urinary calculi: Secondary | ICD-10-CM | POA: Insufficient documentation

## 2012-03-05 DIAGNOSIS — F17200 Nicotine dependence, unspecified, uncomplicated: Secondary | ICD-10-CM | POA: Insufficient documentation

## 2012-03-05 DIAGNOSIS — R51 Headache: Secondary | ICD-10-CM | POA: Insufficient documentation

## 2012-03-05 DIAGNOSIS — R42 Dizziness and giddiness: Secondary | ICD-10-CM | POA: Insufficient documentation

## 2012-03-05 DIAGNOSIS — R110 Nausea: Secondary | ICD-10-CM | POA: Insufficient documentation

## 2012-03-05 DIAGNOSIS — Z79899 Other long term (current) drug therapy: Secondary | ICD-10-CM | POA: Insufficient documentation

## 2012-03-05 MED ORDER — METOCLOPRAMIDE HCL 5 MG/ML IJ SOLN
10.0000 mg | Freq: Once | INTRAMUSCULAR | Status: AC
  Administered 2012-03-05: 10 mg via INTRAVENOUS
  Filled 2012-03-05: qty 2

## 2012-03-05 MED ORDER — SODIUM CHLORIDE 0.9 % IV BOLUS (SEPSIS)
1000.0000 mL | Freq: Once | INTRAVENOUS | Status: AC
  Administered 2012-03-05: 1000 mL via INTRAVENOUS

## 2012-03-05 MED ORDER — DIPHENHYDRAMINE HCL 50 MG/ML IJ SOLN
25.0000 mg | Freq: Once | INTRAMUSCULAR | Status: AC
  Administered 2012-03-05: 25 mg via INTRAVENOUS
  Filled 2012-03-05: qty 1

## 2012-03-05 MED ORDER — DEXAMETHASONE SODIUM PHOSPHATE 10 MG/ML IJ SOLN
10.0000 mg | Freq: Once | INTRAMUSCULAR | Status: AC
  Administered 2012-03-05: 10 mg via INTRAVENOUS
  Filled 2012-03-05: qty 1

## 2012-03-05 NOTE — ED Notes (Signed)
Patient resting with NAD  Family at bedside

## 2012-03-05 NOTE — ED Notes (Signed)
Returned from CT.

## 2012-03-05 NOTE — ED Notes (Addendum)
Patient states she started with a toothache x 2 days ago and was seen in the ED and now patient states she started with headaches several days ago and now the headache is worse and she can not sleep at night and pain radiates to left side of her face and left eye. Patient states that when she gets upset she gets severe headache and have family hx of aneurism. Patient states she is light sensitive.

## 2012-03-05 NOTE — ED Notes (Signed)
Per pt 1 week of left sided head pain and eye soreness. sts the light hurts her eyes and they are sensitive.

## 2012-03-05 NOTE — ED Notes (Signed)
Patient IV complete and Saline locked. Patient resting and continues to have headache. Family at bedside.

## 2012-03-05 NOTE — ED Provider Notes (Signed)
History     CSN: [id number]  Arrival date & time 03/05/12  1215   First MD Initiated Contact with Patient 03/05/12 1430      Chief Complaint  Patient presents with  . Headache    (Consider location/radiation/quality/duration/timing/severity/associated sxs/prior treatment) HPI Comments: Patient presents today with a chief complaint of headache.  Headache located in the left temporal area.  She describes the headache as a throbbing pain.  Headache has been intermittent over the past week.  She reports that she has never had a headache like this before.  No prior history of migraines.  Headache is associated with some photophobia.  She also has had some numbness and tingling of both of her hands for the past week.  She has had generalized weakness, but no focal weakness.  She denies fever, but has had chills.  No neck pain or stiffness.  She also states that she has felt unsteady on her feet for the past week.  She has taken Ibuprofen for the pain, but does not feel that it is helping.  Patient is a 26 y.o. female presenting with headaches. The history is provided by the patient.  Headache  The problem has been gradually worsening. The headache is associated with nothing. The pain does not radiate. Associated symptoms include nausea. Pertinent negatives include no fever, no near-syncope, no syncope and no vomiting.    Past Medical History  Diagnosis Date  . Kidney stones     History reviewed. No pertinent past surgical history.  History reviewed. No pertinent family history.  History  Substance Use Topics  . Smoking status: Current Every Day Smoker  . Smokeless tobacco: Not on file  . Alcohol Use: No    OB History    Grav Para Term Preterm Abortions TAB SAB Ect Mult Living                  Review of Systems  Constitutional: Positive for chills. Negative for fever.  Eyes: Positive for photophobia.  Cardiovascular: Negative for syncope and near-syncope.  Gastrointestinal:  Positive for nausea. Negative for vomiting.  Neurological: Positive for dizziness, weakness, numbness and headaches. Negative for syncope.  Psychiatric/Behavioral: Negative for confusion.    Allergies  Review of patient's allergies indicates no known allergies.  Home Medications   Current Outpatient Rx  Name  Route  Sig  Dispense  Refill  . ADULT MULTIVITAMIN W/MINERALS CH   Oral   Take 1 tablet by mouth daily.         . OXYCODONE-ACETAMINOPHEN 5-325 MG PO TABS   Oral   Take 2 tablets by mouth every 4 (four) hours as needed for pain.   10 tablet   0   . PENICILLIN V POTASSIUM 500 MG PO TABS   Oral   Take 500 mg by mouth 3 (three) times daily. For 10 days Started 11/5           BP 108/78  Pulse 87  Temp 98 F (36.7 C)  Resp 18  SpO2 99%  LMP 02/28/2012  Physical Exam  Nursing note and vitals reviewed. Constitutional: She appears well-developed and well-nourished.       Uncomfortable appearing  HENT:  Head: Normocephalic and atraumatic.  Mouth/Throat: Oropharynx is clear and moist.  Eyes: EOM are normal. Pupils are equal, round, and reactive to light. Right eye exhibits no discharge. Left eye exhibits no discharge. Right conjunctiva is not injected. Left conjunctiva is not injected.  Neck: Normal range of motion. Neck  supple.  Cardiovascular: Normal rate, regular rhythm and normal heart sounds.   Pulmonary/Chest: Effort normal and breath sounds normal.  Musculoskeletal: Normal range of motion.  Neurological: She is alert. She has normal strength. No cranial nerve deficit or sensory deficit. Coordination and gait normal.  Skin: Skin is warm and dry. No rash noted. She is not diaphoretic.  Psychiatric: She has a normal mood and affect.    ED Course  Procedures (including critical care time)  Labs Reviewed - No data to display Ct Head Wo Contrast  03/05/2012  *RADIOLOGY REPORT*  Clinical Data: Headache.  Toothache.  CT HEAD WITHOUT CONTRAST  Technique:   Contiguous axial images were obtained from the base of the skull through the vertex without contrast.  Comparison: 05/30/2009 CT head  Findings: There is no evidence for acute infarction, intracranial hemorrhage, mass lesion, hydrocephalus, or extra-axial fluid. There is no atrophy or white matter disease.  The calvarium is intact.  The visualized paranasal sinuses are clear.  There is no change from priors.  IMPRESSION: Normal CT head.   Original Report Authenticated By: Davonna Belling, M.D.      No diagnosis found.  5:08 PM Reassessed patient.  She reports that her headache has improved at this time.  MDM  Patient presents today with a headache.  No prior history of Migraine Headaches.  She states that she has never had a headache like this before.  Migraine cocktail ordered and Head CT also ordered due to the fact that the patient has never had headache like this before.  Head CT negative.  Symptoms improved after given Migraine Cocktail.  Patient afebrile.  Normal neurological exam.  Therefore, patient discharged home with PCP follow up.        Pascal Lux [place], PA-C 03/05/12 2315

## 2012-03-05 NOTE — ED Notes (Signed)
Patient transported to CT 

## 2012-03-07 NOTE — ED Provider Notes (Signed)
Medical screening examination/treatment/procedure(s) were performed by non-physician practitioner and as supervising physician I was immediately available for consultation/collaboration.   Kaiyu Mirabal, MD 03/07/12 2027 

## 2012-04-20 ENCOUNTER — Encounter (HOSPITAL_COMMUNITY): Payer: Self-pay | Admitting: *Deleted

## 2012-04-20 ENCOUNTER — Emergency Department (HOSPITAL_COMMUNITY)
Admission: EM | Admit: 2012-04-20 | Discharge: 2012-04-20 | Disposition: A | Discharge status: Home / Self Care | Payer: Self-pay | Attending: Emergency Medicine | Admitting: Emergency Medicine

## 2012-04-20 DIAGNOSIS — Z87442 Personal history of urinary calculi: Secondary | ICD-10-CM | POA: Insufficient documentation

## 2012-04-20 DIAGNOSIS — N760 Acute vaginitis: Secondary | ICD-10-CM | POA: Insufficient documentation

## 2012-04-20 DIAGNOSIS — Z202 Contact with and (suspected) exposure to infections with a predominantly sexual mode of transmission: Secondary | ICD-10-CM | POA: Insufficient documentation

## 2012-04-20 DIAGNOSIS — F17200 Nicotine dependence, unspecified, uncomplicated: Secondary | ICD-10-CM | POA: Insufficient documentation

## 2012-04-20 DIAGNOSIS — Z3202 Encounter for pregnancy test, result negative: Secondary | ICD-10-CM | POA: Insufficient documentation

## 2012-04-20 LAB — WET PREP, GENITAL: Yeast Wet Prep HPF POC: NONE SEEN

## 2012-04-20 LAB — COMPREHENSIVE METABOLIC PANEL WITH GFR
ALT: 16 U/L (ref 0–35)
AST: 18 U/L (ref 0–37)
Albumin: 3.6 g/dL (ref 3.5–5.2)
Alkaline Phosphatase: 103 U/L (ref 39–117)
BUN: 11 mg/dL (ref 6–23)
CO2: 24 meq/L (ref 19–32)
Calcium: 9.6 mg/dL (ref 8.4–10.5)
Chloride: 104 meq/L (ref 96–112)
Creatinine, Ser: 0.92 mg/dL (ref 0.50–1.10)
GFR calc Af Amer: >90 mL/min (ref >90)
GFR calc non Af Amer: 85 mL/min — ABNORMAL LOW (ref >90)
Glucose, Bld: 83 mg/dL (ref 70–99)
Potassium: 3.4 meq/L — ABNORMAL LOW (ref 3.5–5.1)
Sodium: 139 meq/L (ref 135–145)
Total Bilirubin: 0.1 mg/dL — ABNORMAL LOW (ref 0.3–1.2)
Total Protein: 7.3 g/dL (ref 6.0–8.3)

## 2012-04-20 LAB — CBC WITH DIFFERENTIAL/PLATELET
Basophils Absolute: 0 10*3/uL (ref 0.0–0.1)
Lymphocytes Relative: 33 % (ref 12–46)
Neutro Abs: 3.6 10*3/uL (ref 1.7–7.7)
Neutrophils Relative %: 55 % (ref 43–77)
Platelets: 382 10*3/uL (ref 150–400)
RDW: 14.4 % (ref 11.5–15.5)
WBC: 6.5 10*3/uL (ref 4.0–10.5)

## 2012-04-20 LAB — URINALYSIS, ROUTINE W REFLEX MICROSCOPIC
Bilirubin Urine: NEGATIVE
Glucose, UA: NEGATIVE mg/dL
Hgb urine dipstick: NEGATIVE
Ketones, ur: NEGATIVE mg/dL
Leukocytes, UA: NEGATIVE
Nitrite: NEGATIVE
Protein, ur: NEGATIVE mg/dL
Specific Gravity, Urine: 1.029 (ref 1.005–1.030)
Urobilinogen, UA: 0.2 mg/dL (ref 0.0–1.0)
pH: 7.5 (ref 5.0–8.0)

## 2012-04-20 LAB — PREGNANCY, URINE: Preg Test, Ur: NEGATIVE

## 2012-04-20 MED ORDER — CEFTRIAXONE SODIUM 250 MG IJ SOLR
250.0000 mg | Freq: Once | INTRAMUSCULAR | Status: AC
  Administered 2012-04-20: 250 mg via INTRAMUSCULAR
  Filled 2012-04-20: qty 250

## 2012-04-20 MED ORDER — METRONIDAZOLE 0.75 % VA GEL: 1.0000 | Freq: Two times a day (BID) | VAGINAL | Status: DC

## 2012-04-20 MED ORDER — AZITHROMYCIN 250 MG PO TABS
1000.0000 mg | ORAL_TABLET | Freq: Once | ORAL | Status: AC
Start: 2012-04-20 — End: 2012-04-20
  Administered 2012-04-20: 1000 mg via ORAL
  Filled 2012-04-20: qty 4

## 2012-04-20 NOTE — ED Provider Notes (Signed)
History     CSN: [id number]  Arrival date & time 04/20/12  1507   First MD Initiated Contact with Patient 04/20/12 1940      Chief Complaint  Patient presents with  . Exposure to STD    (Consider location/radiation/quality/duration/timing/severity/associated sxs/prior treatment) Patient is a 26 y.o. female presenting with STD exposure. The history is provided by the patient.  Exposure to STD This is a new problem. The current episode started 1 to 4 weeks ago. Pertinent negatives include no abdominal pain, fever or nausea. Associated symptoms comments: Vaginal discharge after a known exposure to STD last week. Partner reports he was treated for gonorrhea. No fever, abdominal pain, dysuria..    Past Medical History  Diagnosis Date  . Kidney stones     History reviewed. No pertinent past surgical history.  No family history on file.  History  Substance Use Topics  . Smoking status: Current Every Day Smoker  . Smokeless tobacco: Not on file  . Alcohol Use: No    OB History    Grav Para Term Preterm Abortions TAB SAB Ect Mult Living                  Review of Systems  Constitutional: Negative for fever and appetite change.  Gastrointestinal: Negative for nausea and abdominal pain.  Genitourinary: Positive for vaginal discharge. Negative for dysuria.    Allergies  Review of patient's allergies indicates no known allergies.  Home Medications  No current outpatient prescriptions on file.  BP 109/73  Pulse 103  Temp 98.1 F (36.7 C) (Oral)  Resp 20  SpO2 99%  LMP 04/05/2012  Physical Exam  Constitutional: She is oriented to person, place, and time. She appears well-developed and well-nourished.  HENT:  Head: Normocephalic.  Neck: Normal range of motion. Neck supple.  Pulmonary/Chest: Effort normal.  Abdominal: Soft. Bowel sounds are normal. There is no tenderness. There is no rebound and no guarding.  Genitourinary: Vagina normal and uterus normal. No  vaginal discharge found.       No adnexal mass or tenderness. No CMT or uterine enlargement.  Musculoskeletal: Normal range of motion.  Neurological: She is alert and oriented to person, place, and time.  Skin: Skin is warm and dry. No rash noted.  Psychiatric: She has a normal mood and affect.    ED Course  Procedures (including critical care time)  Labs Reviewed  URINALYSIS, ROUTINE W REFLEX MICROSCOPIC - Abnormal; Notable for the following:    APPearance CLOUDY (*)     All other components within normal limits  CBC WITH DIFFERENTIAL - Abnormal; Notable for the following:    RBC 3.75 (*)     Hemoglobin 10.6 (*)     HCT 33.4 (*)     All other components within normal limits  COMPREHENSIVE METABOLIC PANEL - Abnormal; Notable for the following:    Potassium 3.4 (*)     Total Bilirubin 0.1 (*)     GFR calc non Af Amer 85 (*)     All other components within normal limits  WET PREP, GENITAL - Abnormal; Notable for the following:    Clue Cells Wet Prep HPF POC MODERATE (*)     WBC, Wet Prep HPF POC MODERATE (*)     All other components within normal limits  PREGNANCY, URINE  GC/CHLAMYDIA PROBE AMP   Results for orders placed during the hospital encounter of 04/20/12  URINALYSIS, ROUTINE W REFLEX MICROSCOPIC      Component Value  Range   Color, Urine YELLOW  YELLOW   APPearance CLOUDY (*) CLEAR   Specific Gravity, Urine 1.029  1.005 - 1.030   pH 7.5  5.0 - 8.0   Glucose, UA NEGATIVE  NEGATIVE mg/dL   Hgb urine dipstick NEGATIVE  NEGATIVE   Bilirubin Urine NEGATIVE  NEGATIVE   Ketones, ur NEGATIVE  NEGATIVE mg/dL   Protein, ur NEGATIVE  NEGATIVE mg/dL   Urobilinogen, UA 0.2  0.0 - 1.0 mg/dL   Nitrite NEGATIVE  NEGATIVE   Leukocytes, UA NEGATIVE  NEGATIVE  PREGNANCY, URINE      Component Value Range   Preg Test, Ur NEGATIVE  NEGATIVE  CBC WITH DIFFERENTIAL      Component Value Range   WBC 6.5  4.0 - 10.5 K/uL   RBC 3.75 (*) 3.87 - 5.11 MIL/uL   Hemoglobin 10.6 (*) 12.0 -  15.0 g/dL   HCT 47.8 (*) 29.5 - 62.1 %   MCV 89.1  78.0 - 100.0 fL   MCH 28.3  26.0 - 34.0 pg   MCHC 31.7  30.0 - 36.0 g/dL   RDW 30.8  65.7 - 84.6 %   Platelets 382  150 - 400 K/uL   Neutrophils Relative 55  43 - 77 %   Neutro Abs 3.6  1.7 - 7.7 K/uL   Lymphocytes Relative 33  12 - 46 %   Lymphs Abs 2.1  0.7 - 4.0 K/uL   Monocytes Relative 8  3 - 12 %   Monocytes Absolute 0.5  0.1 - 1.0 K/uL   Eosinophils Relative 4  0 - 5 %   Eosinophils Absolute 0.2  0.0 - 0.7 K/uL   Basophils Relative 1  0 - 1 %   Basophils Absolute 0.0  0.0 - 0.1 K/uL  COMPREHENSIVE METABOLIC PANEL      Component Value Range   Sodium 139  135 - 145 mEq/L   Potassium 3.4 (*) 3.5 - 5.1 mEq/L   Chloride 104  96 - 112 mEq/L   CO2 24  19 - 32 mEq/L   Glucose, Bld 83  70 - 99 mg/dL   BUN 11  6 - 23 mg/dL   Creatinine, Ser 9.62  0.50 - 1.10 mg/dL   Calcium 9.6  8.4 - 95.2 mg/dL   Total Protein 7.3  6.0 - 8.3 g/dL   Albumin 3.6  3.5 - 5.2 g/dL   AST 18  0 - 37 U/L   ALT 16  0 - 35 U/L   Alkaline Phosphatase 103  39 - 117 U/L   Total Bilirubin 0.1 (*) 0.3 - 1.2 mg/dL   GFR calc non Af Amer 85 (*) >90 mL/min   GFR calc Af Amer >90  >90 mL/min  WET PREP, GENITAL      Component Value Range   Yeast Wet Prep HPF POC NONE SEEN  NONE SEEN   Trich, Wet Prep NONE SEEN  NONE SEEN   Clue Cells Wet Prep HPF POC MODERATE (*) NONE SEEN   WBC, Wet Prep HPF POC MODERATE (*) NONE SEEN    No results found.   No diagnosis found.  1. Exposure to STD 2. Bacterial Vaginitis  MDM  Patient treated based on exposure to STD with Rocephin and Zithromax.         Rodena Medin, PA-C 04/20/12 2115

## 2012-04-20 NOTE — ED Notes (Signed)
MD at bedside. 

## 2012-04-20 NOTE — ED Notes (Signed)
Vaginal discharge for 2-3  Weeks.  Her sexual partner was treated for gc and chl lmp dec 12th

## 2012-04-22 LAB — GC/CHLAMYDIA PROBE AMP: CT Probe RNA: NEGATIVE

## 2012-04-23 NOTE — ED Notes (Addendum)
+   gonorrhea Patient treated with Rocephin and zithromax-[employer] letter faxed

## 2012-04-24 NOTE — ED Provider Notes (Signed)
Medical screening examination/treatment/procedure(s) were performed by non-physician practitioner and as supervising physician I was immediately available for consultation/collaboration.  Raeford Razor, MD 04/24/12 1400

## 2012-04-28 ENCOUNTER — Telehealth (HOSPITAL_COMMUNITY): Payer: Self-pay | Admitting: Emergency Medicine

## 2012-05-04 ENCOUNTER — Telehealth (HOSPITAL_COMMUNITY): Payer: Self-pay | Admitting: Emergency Medicine

## 2012-05-04 NOTE — ED Notes (Signed)
Patient returned call after receiving letter. ID verified x three. Notified of + gonorrhea result. STD instructions provided.

## 2012-06-03 ENCOUNTER — Telehealth (HOSPITAL_COMMUNITY): Payer: Self-pay | Admitting: Emergency Medicine

## 2012-06-03 NOTE — ED Notes (Signed)
No response to letter sent after 30 days. Chart sent to Medical Records. °

## 2012-07-06 ENCOUNTER — Emergency Department (HOSPITAL_COMMUNITY)
Admission: EM | Admit: 2012-07-06 | Discharge: 2012-07-06 | Disposition: A | Discharge status: Home / Self Care | Payer: Self-pay | Attending: Emergency Medicine | Admitting: Emergency Medicine

## 2012-07-06 ENCOUNTER — Encounter (HOSPITAL_COMMUNITY): Payer: Self-pay | Admitting: Emergency Medicine

## 2012-07-06 DIAGNOSIS — Z87442 Personal history of urinary calculi: Secondary | ICD-10-CM | POA: Insufficient documentation

## 2012-07-06 DIAGNOSIS — R5383 Other fatigue: Secondary | ICD-10-CM | POA: Insufficient documentation

## 2012-07-06 DIAGNOSIS — F17200 Nicotine dependence, unspecified, uncomplicated: Secondary | ICD-10-CM | POA: Insufficient documentation

## 2012-07-06 DIAGNOSIS — R5381 Other malaise: Secondary | ICD-10-CM | POA: Insufficient documentation

## 2012-07-06 DIAGNOSIS — L659 Nonscarring hair loss, unspecified: Secondary | ICD-10-CM | POA: Insufficient documentation

## 2012-07-06 LAB — POCT I-STAT, CHEM 8
BUN: 14 mg/dL (ref 6–23)
Calcium, Ion: 1.26 mmol/L — ABNORMAL HIGH (ref 1.12–1.23)
Creatinine, Ser: 1.1 mg/dL (ref 0.50–1.10)
Hemoglobin: 12.6 g/dL (ref 12.0–15.0)
TCO2: 25 mmol/L (ref 0–100)

## 2012-07-06 NOTE — ED Notes (Signed)
Pt reports hair falling out for a few weeks, worse over the past few days. Pt also reports extreme fatigue.

## 2012-07-06 NOTE — ED Provider Notes (Signed)
History     CSN: [id number]  Arrival date & time 07/06/12  1348   First MD Initiated Contact with Patient 07/06/12 1432      Chief Complaint  Patient presents with  . Alopecia  . Fatigue    HPI Pt reports hair falling out for a few weeks, worse over the past few days. Pt also reports extreme fatigue.  Past Medical History  Diagnosis Date  . Kidney stones     History reviewed. No pertinent past surgical history.  History reviewed. No pertinent family history.  History  Substance Use Topics  . Smoking status: Current Every Day Smoker  . Smokeless tobacco: Not on file  . Alcohol Use: No    OB History   Grav Para Term Preterm Abortions TAB SAB Ect Mult Living                  Review of Systems  All other systems reviewed and are negative.    Allergies  Review of patient's allergies indicates no known allergies.  Home Medications  No current outpatient prescriptions on file.  BP 100/59  Pulse 71  Temp(Src) 99.3 F (37.4 C) (Oral)  Resp 18  SpO2 100%  LMP 06/06/2012  Physical Exam  Nursing note and vitals reviewed. Constitutional: She is oriented to person, place, and time. She appears well-developed and well-nourished. No distress.  HENT:  Head: Normocephalic and atraumatic.  No bald or bare spots noted on scalp.  No evidence of lice or other derm problems.  Eyes: Pupils are equal, round, and reactive to light.  Neck: Normal range of motion. No tracheal deviation present.  Cardiovascular: Normal rate and intact distal pulses.   Pulmonary/Chest: No respiratory distress.  Abdominal: Normal appearance. She exhibits no distension. There is no tenderness. There is no rebound.  Musculoskeletal: Normal range of motion.  Lymphadenopathy:    She has no cervical adenopathy.  Neurological: She is alert and oriented to person, place, and time. No cranial nerve deficit. Coordination normal.  Skin: Skin is warm and dry. No rash noted.  Psychiatric: She has a  normal mood and affect. Her behavior is normal.    ED Course  Procedures (including critical care time)  Labs Reviewed  POCT I-STAT, CHEM 8 - Abnormal; Notable for the following:    Calcium, Ion 1.26 (*)    All other components within normal limits  RPR   No results found.   1. Alopecia       MDM         Nelia Shi, MD 07/06/12 [phone]

## 2012-07-12 ENCOUNTER — Telehealth (HOSPITAL_COMMUNITY): Payer: Self-pay | Admitting: Emergency Medicine

## 2012-07-12 NOTE — ED Notes (Signed)
Returned pt call.  ID verified x 2.  Pt informed RPR non reactive.

## 2012-09-02 ENCOUNTER — Encounter (HOSPITAL_COMMUNITY): Payer: Self-pay | Admitting: *Deleted

## 2012-09-02 ENCOUNTER — Emergency Department (HOSPITAL_COMMUNITY)
Admission: EM | Admit: 2012-09-02 | Discharge: 2012-09-02 | Disposition: A | Discharge status: Home / Self Care | Payer: Self-pay | Attending: Emergency Medicine | Admitting: Emergency Medicine

## 2012-09-02 ENCOUNTER — Emergency Department (HOSPITAL_COMMUNITY): Payer: Self-pay

## 2012-09-02 DIAGNOSIS — N12 Tubulo-interstitial nephritis, not specified as acute or chronic: Secondary | ICD-10-CM | POA: Insufficient documentation

## 2012-09-02 DIAGNOSIS — IMO0001 Reserved for inherently not codable concepts without codable children: Secondary | ICD-10-CM | POA: Insufficient documentation

## 2012-09-02 DIAGNOSIS — R5381 Other malaise: Secondary | ICD-10-CM | POA: Insufficient documentation

## 2012-09-02 DIAGNOSIS — K5900 Constipation, unspecified: Secondary | ICD-10-CM | POA: Insufficient documentation

## 2012-09-02 DIAGNOSIS — R509 Fever, unspecified: Secondary | ICD-10-CM | POA: Insufficient documentation

## 2012-09-02 DIAGNOSIS — Z3202 Encounter for pregnancy test, result negative: Secondary | ICD-10-CM | POA: Insufficient documentation

## 2012-09-02 DIAGNOSIS — M549 Dorsalgia, unspecified: Secondary | ICD-10-CM | POA: Insufficient documentation

## 2012-09-02 DIAGNOSIS — Z87442 Personal history of urinary calculi: Secondary | ICD-10-CM | POA: Insufficient documentation

## 2012-09-02 DIAGNOSIS — R112 Nausea with vomiting, unspecified: Secondary | ICD-10-CM | POA: Insufficient documentation

## 2012-09-02 DIAGNOSIS — F17200 Nicotine dependence, unspecified, uncomplicated: Secondary | ICD-10-CM | POA: Insufficient documentation

## 2012-09-02 DIAGNOSIS — R51 Headache: Secondary | ICD-10-CM | POA: Insufficient documentation

## 2012-09-02 DIAGNOSIS — R000 Tachycardia, unspecified: Secondary | ICD-10-CM | POA: Insufficient documentation

## 2012-09-02 LAB — URINALYSIS, ROUTINE W REFLEX MICROSCOPIC
Bilirubin Urine: NEGATIVE
Ketones, ur: NEGATIVE mg/dL
Nitrite: POSITIVE — AB
Protein, ur: NEGATIVE mg/dL
pH: 8 (ref 5.0–8.0)

## 2012-09-02 LAB — CBC WITH DIFFERENTIAL/PLATELET
Basophils Absolute: 0 10*3/uL (ref 0.0–0.1)
Basophils Relative: 0 % (ref 0–1)
Eosinophils Absolute: 0 10*3/uL (ref 0.0–0.7)
Eosinophils Relative: 0 % (ref 0–5)
Lymphs Abs: 1.1 10*3/uL (ref 0.7–4.0)
MCH: 28.3 pg (ref 26.0–34.0)
MCHC: 33 g/dL (ref 30.0–36.0)
MCV: 85.6 fL (ref 78.0–100.0)
Neutrophils Relative %: 76 % (ref 43–77)
Platelets: 328 10*3/uL (ref 150–400)
RDW: 14.8 % (ref 11.5–15.5)

## 2012-09-02 LAB — LIPASE, BLOOD: Lipase: 15 U/L (ref 11–59)

## 2012-09-02 LAB — URINE MICROSCOPIC-ADD ON

## 2012-09-02 MED ORDER — MORPHINE SULFATE 4 MG/ML IJ SOLN: 8.0000 mg | Freq: Once | INTRAMUSCULAR | Status: DC

## 2012-09-02 MED ORDER — ONDANSETRON HCL 4 MG PO TABS: 4.0000 mg | ORAL_TABLET | Freq: Three times a day (TID) | ORAL | Status: DC | PRN

## 2012-09-02 MED ORDER — CEPHALEXIN 500 MG PO CAPS: 500.0000 mg | ORAL_CAPSULE | Freq: Four times a day (QID) | ORAL | Status: DC

## 2012-09-02 MED ORDER — ONDANSETRON 4 MG PO TBDP
4.0000 mg | ORAL_TABLET | Freq: Once | ORAL | Status: AC
  Administered 2012-09-02: 4 mg via ORAL
  Filled 2012-09-02: qty 1

## 2012-09-02 MED ORDER — CEFTRIAXONE SODIUM 1 G IJ SOLR: 1.0000 g | Freq: Once | INTRAMUSCULAR | Status: DC

## 2012-09-02 MED ORDER — SODIUM CHLORIDE 0.9 % IV BOLUS (SEPSIS)
1000.0000 mL | INTRAVENOUS | Status: AC
  Administered 2012-09-02: 1000 mL via INTRAVENOUS

## 2012-09-02 MED ORDER — DEXTROSE 5 % IV SOLN
1.0000 g | Freq: Once | INTRAVENOUS | Status: AC
  Administered 2012-09-02: 1 g via INTRAVENOUS
  Filled 2012-09-02: qty 10

## 2012-09-02 MED ORDER — HYDROMORPHONE HCL PF 2 MG/ML IJ SOLN: 2.0000 mg | Freq: Once | INTRAMUSCULAR | Status: DC

## 2012-09-02 MED ORDER — ACETAMINOPHEN 500 MG PO TABS
1000.0000 mg | ORAL_TABLET | Freq: Once | ORAL | Status: AC
  Administered 2012-09-02: 1000 mg via ORAL
  Filled 2012-09-02: qty 1

## 2012-09-02 NOTE — ED Notes (Signed)
IV RN at bedside 

## 2012-09-02 NOTE — ED Notes (Signed)
Pt is here with fever, vomiting, constipation, body aches, and generalized abdominal pain for the last 2 days.  LMP:  4.24.14.  No urinary s/s.  No vaginal discharge or bleeding

## 2012-09-02 NOTE — ED Provider Notes (Signed)
History     CSN: [id number]  Arrival date & time 09/02/12  0911   First MD Initiated Contact with Patient 09/02/12 1114      Chief Complaint  Patient presents with  . Emesis  . Fever  . Abdominal Pain    (Consider location/radiation/quality/duration/timing/severity/associated sxs/prior treatment) Patient is a 27 y.o. female presenting with vomiting, fever, and abdominal pain.  Emesis Associated symptoms: abdominal pain, chills, headaches and myalgias   Fever Associated symptoms: chills, headaches, myalgias and vomiting   Associated symptoms: no chest pain, no cough, no dysuria and no rash   Abdominal Pain Associated symptoms: chills, constipation, fatigue, fever and vomiting   Associated symptoms: no chest pain, no cough, no dysuria, no hematuria, no shortness of breath, no vaginal bleeding and no vaginal discharge    27 year old female presents the emergency department with multiple complaints.  Is a past medical history of kidney stones.  She's had 4 days of constipation without bowel movement.  2 days ago patient was awoken from sleep with severe right flank and abdominal pain.  Patient states that she laid still in her bed and tried to massage the area for relief.  She states that the pain was intermittent colicky and severe.  Her mother gave her Excedrin Migraine which did help with the pain however her pain returned yesterday.  This morning the patient awoke with nausea, vomiting, generalized malaise and body aches.  Temperature was mildly elevated.  Patient states that her pain feels similar to her previous kidney stone.  She denies any urinary symptoms.  She denies any vaginal symptoms.  Patient denies any bilious or bloody emesis.  First day of last menstrual period was 08/16/2012.  Past Medical History  Diagnosis Date  . Kidney stones     No past surgical history on file.  No family history on file.  History  Substance Use Topics  . Smoking status: Current Every Day  Smoker  . Smokeless tobacco: Not on file  . Alcohol Use: No    OB History   Grav Para Term Preterm Abortions TAB SAB Ect Mult Living                  Review of Systems  Constitutional: Positive for fever, chills, activity change, appetite change and fatigue.  HENT: Negative for neck stiffness.   Eyes: Negative for photophobia.  Respiratory: Negative for cough and shortness of breath.   Cardiovascular: Negative for chest pain.  Gastrointestinal: Positive for vomiting, abdominal pain and constipation.  Genitourinary: Positive for flank pain. Negative for dysuria, urgency, hematuria, vaginal bleeding, vaginal discharge and vaginal pain.  Musculoskeletal: Positive for myalgias and back pain.  Skin: Negative for rash.  Neurological: Positive for weakness and headaches.    Allergies  Review of patient's allergies indicates no known allergies.  Home Medications  No current outpatient prescriptions on file.  BP 107/63  Pulse 101  Temp(Src) 99.6 F (37.6 C) (Oral)  Resp 18  SpO2 99%  LMP 08/16/2012  Physical Exam  Nursing note and vitals reviewed. Constitutional: She is oriented to person, place, and time. She appears well-developed and well-nourished. No distress.  Tactile fever.  Appears ill and uncomfortable.  Glassy eyed.  HENT:  Head: Normocephalic and atraumatic.  Eyes: Conjunctivae are normal. No scleral icterus.  Neck: Normal range of motion.  Cardiovascular: Regular rhythm and normal heart sounds.  Exam reveals no gallop and no friction rub.   No murmur heard. Mildly tachycardic  Pulmonary/Chest: Effort normal and  breath sounds normal. No respiratory distress.  Abdominal: Soft. Bowel sounds are normal. She exhibits no distension and no mass. There is tenderness. There is no guarding.  Positive CVA tenderness bilaterally worse on the right side.  Suprapubic tenderness  Musculoskeletal: She exhibits tenderness. She exhibits no edema.  Neurological: She is alert and  oriented to person, place, and time.  Skin: Skin is warm and dry. She is not diaphoretic.  Psychiatric: She has a normal mood and affect. Her behavior is normal.    ED Course  Procedures (including critical care time)  Labs Reviewed  CBC WITH DIFFERENTIAL - Abnormal; Notable for the following:    RBC 3.68 (*)    Hemoglobin 10.4 (*)    HCT 31.5 (*)    Lymphocytes Relative 11 (*)    Monocytes Absolute 1.2 (*)    All other components within normal limits  URINALYSIS, ROUTINE W REFLEX MICROSCOPIC - Abnormal; Notable for the following:    APPearance CLOUDY (*)    Nitrite POSITIVE (*)    Leukocytes, UA MODERATE (*)    All other components within normal limits  URINE MICROSCOPIC-ADD ON - Abnormal; Notable for the following:    Squamous Epithelial / LPF FEW (*)    Bacteria, UA MANY (*)    All other components within normal limits  URINE CULTURE  LIPASE, BLOOD  POCT PREGNANCY, URINE   Ct Abdomen Pelvis Wo Contrast  09/02/2012  *RADIOLOGY REPORT*  Clinical Data: Fever and emesis.  Abdominal pain.  Acute onset of flank pain.  CT ABDOMEN AND PELVIS WITHOUT CONTRAST  Technique:  Multidetector CT imaging of the abdomen and pelvis was performed following the standard protocol without intravenous contrast.  Comparison: None.  Findings: The lung bases are clear without focal nodule, mass, or airspace disease.  The heart size is normal.  No significant pleural or pericardial effusion is present.  The liver and spleen are within normal limits.  The stomach, duodenum, and pancreas are within normal limits.  The common bile duct and gallbladder are normal.  The adrenal glands and kidneys are within normal limits.  An extrarenal pelvis is present on the right.  The distal ureter is unremarkable.  There is no evidence for obstruction.  The rectosigmoid colon is within normal limits.  The remainder the colon is unremarkable.  The appendix is visualized and normal. Small bowel is within normal limits.  The  uterus and adnexa are normal for age.  A small amount of free fluid is likely physiologic.  The bone windows are unremarkable.  IMPRESSION:  1.  No acute or focal abnormality to explain the patient's symptoms. 2.  No evidence for nephrolithiasis or obstruction. 3.  A small amount of free fluid in the pelvis is likely physiologic.   Original Report Authenticated By: Marin Roberts, M.D.      1. Pyelocystitis       MDM  11:58 AM Filed Vitals:   09/02/12 0926  BP: 107/63  Pulse: 101  Temp: 99.6 F (37.6 C)  Resp: 18   Patient with history of kidney stone.  Her history is suggestive of recurrent urolithiasis and I suspect that this has developed into pyelo-nephritis.  Urine is positive for infection.  It is tachycardic with mildly elevated temperature.  Negative pregnancy test I do not suspect vaginal infection or intra-abdominal etiology.  We'll treat the patient with fluids and analgesia.  CT abdomen without contrast is pending.  I started IV antibiotics.    12:51 PM Patient with  negative CT for nephrolithiasis. I do suspect pyelo. Unable to obtain IV access and IV team has been paged.     2:50 PM  Patient is eating and tolerating PO fluids. She states that she is feeling much better after the tylenol. SHE DOES become dizzy when sitting up. Will give another liter of fluids.  4:39 PM Filed Vitals:   09/02/12 1455 09/02/12 1626 09/02/12 1627 09/02/12 1628  BP: 95/57 103/66 103/61 105/69  Pulse: 90 95 86 101  Temp: 98.5 F (36.9 C)     TempSrc: Oral     Resp: 20     SpO2: 100%       Patient negative orthostatic VS after second bolus. She has minimal pain and some mild nausea but ate 2 packs of crackers and was able to hold down 16 oz of fluid. I will d/c patient homw with keflex and zofran. Tylenol for pain. Return precautions discussed.    Arthor Captain, PA-C 09/02/12 1642

## 2012-09-02 NOTE — ED Notes (Signed)
IV attempted x3 by two RNs, unsuccessful, IV team paged.

## 2012-09-03 LAB — URINE CULTURE: Colony Count: >=100000

## 2012-09-17 NOTE — ED Provider Notes (Signed)
Medical screening examination/treatment/procedure(s) were performed by non-physician practitioner and as supervising physician I was immediately available for consultation/collaboration.   Nickey Kloepfer M Sujay Grundman, MD 09/17/12 1727 

## 2012-11-13 ENCOUNTER — Emergency Department (INDEPENDENT_AMBULATORY_CARE_PROVIDER_SITE_OTHER)
Admission: EM | Admit: 2012-11-13 | Discharge: 2012-11-13 | Disposition: A | Discharge status: Home / Self Care | Payer: Medicaid Other | Source: Home / Self Care | Attending: Family Medicine | Admitting: Family Medicine

## 2012-11-13 ENCOUNTER — Encounter (HOSPITAL_COMMUNITY): Payer: Self-pay | Admitting: *Deleted

## 2012-11-13 DIAGNOSIS — L0291 Cutaneous abscess, unspecified: Secondary | ICD-10-CM

## 2012-11-13 DIAGNOSIS — L0390 Cellulitis, unspecified: Secondary | ICD-10-CM

## 2012-11-13 MED ORDER — CEPHALEXIN 500 MG PO CAPS: 500.0000 mg | ORAL_CAPSULE | Freq: Four times a day (QID) | ORAL | Status: DC

## 2012-11-13 NOTE — ED Notes (Addendum)
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  Started itching.  Did not see a spider.  Has 1 "red raised area to R thigh with pustule in middle.  She squeezed it today and got pus out.

## 2012-11-13 NOTE — ED Provider Notes (Signed)
History    CSN: [id number] Arrival date & time 11/13/12  2725  First MD Initiated Contact with Patient 11/13/12 2025     Chief Complaint  Patient presents with  . Insect Bite   (Consider location/radiation/quality/duration/timing/severity/associated sxs/prior Treatment)  HPI Presenting today for insect bite yesterday afternoon at work. Irritated and itchy. Started to swell and become painful. No affecting ability to walk. Tylenol w/o benefit. Located on R thigh. Not sure what bit her.    Past Medical History  Diagnosis Date  . Kidney stones    History reviewed. No pertinent past surgical history. History reviewed. No pertinent family history. History  Substance Use Topics  . Smoking status: Current Every Day Smoker -- 0.80 packs/day    Types: Cigarettes  . Smokeless tobacco: Not on file  . Alcohol Use: No   OB History   Grav Para Term Preterm Abortions TAB SAB Ect Mult Living                 Review of Systems  Constitutional: Negative for fever and fatigue.  Respiratory: Negative for shortness of breath.   Cardiovascular: Negative for chest pain and palpitations.  Gastrointestinal: Negative for abdominal pain and abdominal distention.  Musculoskeletal: Negative for myalgias, back pain, arthralgias and gait problem.  Skin: Positive for wound. Negative for color change and rash.  Neurological: Negative for dizziness, seizures, light-headedness, numbness and headaches.  All other systems reviewed and are negative.    Allergies  Review of patient's allergies indicates no known allergies.  Home Medications   Current Outpatient Rx  Name  Route  Sig  Dispense  Refill  . ondansetron (ZOFRAN) 4 MG tablet   Oral   Take 1 tablet (4 mg total) by mouth every 8 (eight) hours as needed for nausea.   10 tablet   0   . cephALEXin (KEFLEX) 500 MG capsule   Oral   Take 1 capsule (500 mg total) by mouth 4 (four) times daily.   40 capsule   0    BP 120/77  Pulse 80   Temp(Src) 99 F (37.2 C) (Oral)  Resp 14  SpO2 100%  LMP 10/06/2012 Physical Exam  Constitutional: She is oriented to person, place, and time. She appears well-developed and well-nourished. No distress.  HENT:  Head: Normocephalic and atraumatic.  Eyes: Pupils are equal, round, and reactive to light.  Neck: Normal range of motion.  Cardiovascular: Normal rate.   Pulmonary/Chest: Effort normal.  Abdominal: Soft.  Musculoskeletal: Normal range of motion. She exhibits no edema and no tenderness.  Neurological: She is alert and oriented to person, place, and time.  Skin: Skin is warm.  2x2cm area of erythema and induration of the R mid thigh. ttp w/ possible small fluctuance below the surface of the skin    ED Course  Procedures (including critical care time) Labs Reviewed - No data to display No results found. No diagnosis found.  MDM  27yo AAF w/ small abcess and surounding cellulitis. No VS instability.  - abscess drained adn starting on Keflex - skin marked - culture sent - precautions given and red flags discussed All questions answered  Shelly Flatten, MD Family Medicine PGY-3 11/13/2012, 9:14 PM   Incision and Drainage Procedure Note  Pre-operative Diagnosis: abscess and cellulitis  Post-operative Diagnosis: normal  Indications: abscess and painful cellulitis  Anesthesia: ethyl chloride spray  Procedure Details  The procedure, risks and complications have been discussed in detail (including, but not limited to airway compromise,  infection, bleeding) with the patient, and the patient has signed consent to the procedure.  The skin was sterilely prepped and draped over the affected area in the usual fashion. After adequate local anesthesia, I&D with a #11 blade was performed on the right mid thigh. Purulent drainage: minimal expressed The patient was observed until stable.  EBL: 1 cc's  Condition: Tolerated procedure well and  Stable   Complications: none.     Ozella Rocks, MD 11/13/12 2116

## 2012-11-14 NOTE — ED Provider Notes (Signed)
Medical screening examination/treatment/procedure(s) were performed by a resident physician and as supervising physician I was immediately available for consultation/collaboration.  Leslee Home, M.D.   Reuben Likes, MD 11/14/12 [phone]

## 2012-11-17 LAB — CULTURE, ROUTINE-ABSCESS: Gram Stain: NONE SEEN

## 2012-11-19 ENCOUNTER — Emergency Department (HOSPITAL_COMMUNITY)
Admission: EM | Admit: 2012-11-19 | Discharge: 2012-11-19 | Disposition: A | Discharge status: Home / Self Care | Payer: Medicaid Other | Attending: Emergency Medicine | Admitting: Emergency Medicine

## 2012-11-19 ENCOUNTER — Emergency Department (HOSPITAL_COMMUNITY): Payer: Medicaid Other

## 2012-11-19 ENCOUNTER — Telehealth (HOSPITAL_COMMUNITY): Payer: Self-pay | Admitting: *Deleted

## 2012-11-19 ENCOUNTER — Encounter (HOSPITAL_COMMUNITY): Payer: Self-pay | Admitting: Emergency Medicine

## 2012-11-19 DIAGNOSIS — I10 Essential (primary) hypertension: Secondary | ICD-10-CM | POA: Insufficient documentation

## 2012-11-19 DIAGNOSIS — F17200 Nicotine dependence, unspecified, uncomplicated: Secondary | ICD-10-CM | POA: Insufficient documentation

## 2012-11-19 DIAGNOSIS — Z87442 Personal history of urinary calculi: Secondary | ICD-10-CM | POA: Insufficient documentation

## 2012-11-19 DIAGNOSIS — Z3490 Encounter for supervision of normal pregnancy, unspecified, unspecified trimester: Secondary | ICD-10-CM

## 2012-11-19 DIAGNOSIS — Z3201 Encounter for pregnancy test, result positive: Secondary | ICD-10-CM | POA: Insufficient documentation

## 2012-11-19 DIAGNOSIS — N760 Acute vaginitis: Secondary | ICD-10-CM | POA: Insufficient documentation

## 2012-11-19 DIAGNOSIS — B9689 Other specified bacterial agents as the cause of diseases classified elsewhere: Secondary | ICD-10-CM

## 2012-11-19 HISTORY — DX: Essential (primary) hypertension: I10

## 2012-11-19 LAB — BASIC METABOLIC PANEL
Calcium: 8.9 mg/dL (ref 8.4–10.5)
GFR calc non Af Amer: >90 mL/min (ref >90)
Glucose, Bld: 88 mg/dL (ref 70–99)
Sodium: 136 mEq/L (ref 135–145)

## 2012-11-19 LAB — CBC WITH DIFFERENTIAL/PLATELET
Basophils Absolute: 0 10*3/uL (ref 0.0–0.1)
Basophils Relative: 0 % (ref 0–1)
Eosinophils Absolute: 0.2 10*3/uL (ref 0.0–0.7)
Eosinophils Relative: 3 % (ref 0–5)
Lymphs Abs: 2.6 10*3/uL (ref 0.7–4.0)
MCH: 28.9 pg (ref 26.0–34.0)
MCHC: 32.7 g/dL (ref 30.0–36.0)
MCV: 88.4 fL (ref 78.0–100.0)
Platelets: 316 10*3/uL (ref 150–400)
RDW: 14.8 % (ref 11.5–15.5)

## 2012-11-19 LAB — HEPATIC FUNCTION PANEL
Albumin: 3.5 g/dL (ref 3.5–5.2)
Alkaline Phosphatase: 75 U/L (ref 39–117)
Total Protein: 6.9 g/dL (ref 6.0–8.3)

## 2012-11-19 LAB — WET PREP, GENITAL: Yeast Wet Prep HPF POC: NONE SEEN

## 2012-11-19 MED ORDER — ONDANSETRON HCL 4 MG PO TABS: 4.0000 mg | ORAL_TABLET | Freq: Three times a day (TID) | ORAL | Status: DC | PRN

## 2012-11-19 MED ORDER — ONDANSETRON 8 MG PO TBDP
8.0000 mg | ORAL_TABLET | Freq: Once | ORAL | Status: AC
  Administered 2012-11-19: 8 mg via ORAL
  Filled 2012-11-19: qty 1

## 2012-11-19 MED ORDER — PRENATAL COMPLETE 14-0.4 MG PO TABS: 1.0000 | ORAL_TABLET | Freq: Every day | ORAL | Status: DC

## 2012-11-19 MED ORDER — METRONIDAZOLE 500 MG PO TABS: 500.0000 mg | ORAL_TABLET | Freq: Two times a day (BID) | ORAL | Status: DC

## 2012-11-19 NOTE — ED Provider Notes (Signed)
CSN: [id number]     Arrival date & time 11/19/12  1234 History     First MD Initiated Contact with Patient 11/19/12 1536     Chief Complaint  Patient presents with  . Abdominal Pain    HPI Pt was seen at 1540.  Per pt, c/o gradual onset and persistence of lower abd "cramping" pain for the past several weeks. Has been associated with multiple intermittent episodes of nausea. States she has been using "some left over zofran I had" with relief of her nausea. Denies vomiting, no diarrhea, no back pain, no CP/palpitations, no cough/SOB, no black or blood in stools or emesis, no fever, no rash, no vaginal bleeding/discharge, no dysuria/hematuria. Pt states her LMP was 10/13/2012, has not taken a pregnancy test.    Past Medical History  Diagnosis Date  . Hypertension   . Kidney stones    History reviewed. No pertinent past surgical history.  History  Substance Use Topics  . Smoking status: Current Every Day Smoker -- 0.80 packs/day    Types: Cigarettes  . Smokeless tobacco: Not on file  . Alcohol Use: No    Review of Systems ROS: Statement: All systems negative except as marked or noted in the HPI; Constitutional: Negative for fever and chills. ; ; Eyes: Negative for eye pain, redness and discharge. ; ; ENMT: Negative for ear pain, hoarseness, nasal congestion, sinus pressure and sore throat. ; ; Cardiovascular: Negative for chest pain, palpitations, diaphoresis, dyspnea and peripheral edema. ; ; Respiratory: Negative for cough, wheezing and stridor. ; ; Gastrointestinal: +abd pain, nausea. Negative for vomiting, diarrhea, abdominal pain, blood in stool, hematemesis, jaundice and rectal bleeding. . ; ; Genitourinary: Negative for dysuria, flank pain and hematuria. ; ; GYN:  No vaginal bleeding, no vaginal discharge, no vulvar pain.;;  Musculoskeletal: Negative for back pain and neck pain. Negative for swelling and trauma.; ; Skin: Negative for pruritus, rash, abrasions, blisters, bruising and  skin lesion.; ; Neuro: Negative for headache, lightheadedness and neck stiffness. Negative for weakness, altered level of consciousness , altered mental status, extremity weakness, paresthesias, involuntary movement, seizure and syncope.      Allergies  Review of patient's allergies indicates no known allergies.  Home Medications   Current Outpatient Rx  Name  Route  Sig  Dispense  Refill  . ibuprofen (ADVIL,MOTRIN) 200 MG tablet   Oral   Take 400 mg by mouth every 6 (six) hours as needed for pain.         Marland Kitchen ondansetron (ZOFRAN) 4 MG tablet   Oral   Take 1 tablet (4 mg total) by mouth every 8 (eight) hours as needed for nausea.   10 tablet   0   . tetrahydrozoline (VISINE) 0.05 % ophthalmic solution   Both Eyes   Place 1 drop into both eyes as needed (irritation, burning).         . cephALEXin (KEFLEX) 500 MG capsule   Oral   Take 1 capsule (500 mg total) by mouth 4 (four) times daily.   40 capsule   0    BP 117/71  Pulse 77  Temp(Src) 97.6 F (36.4 C) (Oral)  Resp 16  SpO2 100%  LMP 10/06/2012 Physical Exam 1545: Physical examination:  Nursing notes reviewed; Vital signs and O2 SAT reviewed;  Constitutional: Well developed, Well nourished, Well hydrated, In no acute distress; Head:  Normocephalic, atraumatic; Eyes: EOMI, PERRL, No scleral icterus; ENMT: Mouth and pharynx normal, Mucous membranes moist; Neck: Supple, Full range  of motion, No lymphadenopathy; Cardiovascular: Regular rate and rhythm, No murmur, rub, or gallop; Respiratory: Breath sounds clear & equal bilaterally, No rales, rhonchi, wheezes.  Speaking full sentences with ease, Normal respiratory effort/excursion; Chest: Nontender, Movement normal; Abdomen: Soft, +mild suprapubic tenderness to palp. No rebound or guarding. Nondistended, Normal bowel sounds; Genitourinary: No CVA tenderness. Pelvic exam performed with permission of pt and female ED tech assist during exam.  External genitalia w/o lesions.  Vaginal vault with thick white discharge. No bleeding. Cervix w/o lesions, not friable, closed, no bleeding. GC/chlam and wet prep obtained and sent to lab.  Bimanual exam w/o CMT, uterine or adnexal tenderness.;;; Spine:  No midline CS, TS, LS tenderness.;; Extremities: Pulses normal, No tenderness, No edema, No calf edema or asymmetry.; Neuro: AA&Ox3, Major CN grossly intact.  Speech clear. No gross focal motor or sensory deficits in extremities.; Skin: Color normal, Warm, Dry.   ED Course   1550:  Pt's pregnancy test positive. Hx G3P2. EGA approx 5 weeks and 2/7 days. Will obtain pelvic US.    Procedures     MDM  MDM Reviewed: previous chart, nursing note and vitals Reviewed previous: labs Interpretation: labs and ultrasound   Results for orders placed during the hospital encounter of 11/19/12  WET PREP, GENITAL      Result Value Range   Yeast Wet Prep HPF POC NONE SEEN  NONE SEEN   Trich, Wet Prep NONE SEEN  NONE SEEN   Clue Cells Wet Prep HPF POC MODERATE (*) NONE SEEN   WBC, Wet Prep HPF POC RARE (*) NONE SEEN  CBC WITH DIFFERENTIAL      Result Value Range   WBC 6.9  4.0 - 10.5 K/uL   RBC 3.63 (*) 3.87 - 5.11 MIL/uL   Hemoglobin 10.5 (*) 12.0 - 15.0 g/dL   HCT 45.4 (*) 09.8 - 11.9 %   MCV 88.4  78.0 - 100.0 fL   MCH 28.9  26.0 - 34.0 pg   MCHC 32.7  30.0 - 36.0 g/dL   RDW 14.7  82.9 - 56.2 %   Platelets 316  150 - 400 K/uL   Neutrophils Relative % 51  43 - 77 %   Neutro Abs 3.5  1.7 - 7.7 K/uL   Lymphocytes Relative 37  12 - 46 %   Lymphs Abs 2.6  0.7 - 4.0 K/uL   Monocytes Relative 9  3 - 12 %   Monocytes Absolute 0.6  0.1 - 1.0 K/uL   Eosinophils Relative 3  0 - 5 %   Eosinophils Absolute 0.2  0.0 - 0.7 K/uL   Basophils Relative 0  0 - 1 %   Basophils Absolute 0.0  0.0 - 0.1 K/uL  BASIC METABOLIC PANEL      Result Value Range   Sodium 136  135 - 145 mEq/L   Potassium 3.7  3.5 - 5.1 mEq/L   Chloride 105  96 - 112 mEq/L   CO2 23  19 - 32 mEq/L   Glucose, Bld  88  70 - 99 mg/dL   BUN 7  6 - 23 mg/dL   Creatinine, Ser 1.30  0.50 - 1.10 mg/dL   Calcium 8.9  8.4 - 86.5 mg/dL   GFR calc non Af Amer >90  >90 mL/min   GFR calc Af Amer >90  >90 mL/min  LIPASE, BLOOD      Result Value Range   Lipase 27  11 - 59 U/L  HEPATIC FUNCTION PANEL  Result Value Range   Total Protein 6.9  6.0 - 8.3 g/dL   Albumin 3.5  3.5 - 5.2 g/dL   AST 16  0 - 37 U/L   ALT 14  0 - 35 U/L   Alkaline Phosphatase 75  39 - 117 U/L   Total Bilirubin 0.1 (*) 0.3 - 1.2 mg/dL   Bilirubin, Direct <4.0  0.0 - 0.3 mg/dL   Indirect Bilirubin NOT CALCULATED  0.3 - 0.9 mg/dL  POCT PREGNANCY, URINE      Result Value Range   Preg Test, Ur POSITIVE (*) NEGATIVE   US Ob Comp Less 14 Wks 11/19/2012   *RADIOLOGY REPORT*  Clinical Data: pain; ;  OBSTETRIC <14 WK [place] AND TRANSVAGINAL OB US  Technique: Both transabdominal and transvaginal ultrasound examinations were performed for complete evaluation of the gestation as well as the maternal uterus, adnexal regions, and pelvic cul-de-sac.  Comparison: None  Findings: There is a single intrauterine gestation.  Based on mean sac diameter, estimated gestational age is 5 weeks 4 days.  Yolk sac is visualized.  No fetal pole currently.  No subchorionic hemorrhage. The yolk sac the gestational sac is slightly low and in the endometrium, just above the lower uterine segment.  Small right corpus luteal cyst.  Left ovary unremarkable.  Trace free fluid in the pelvis.  IMPRESSION: Single intrauterine gestational sac, 5 weeks 4 days by crown-rump length.  This is somewhat low in the endometrium, just above the lower uterine segment.  Recommend continued follow up with serial quantitative beta HCG and ultrasounds.   Original Report Authenticated By: Charlett Nose, M.D.   Results for BREANAH, FADDIS (MRN [id number]) as of 11/19/2012 18:38  Ref. Range 01/17/2011 21:22 04/20/2012 15:46 07/06/2012 15:18 09/02/2012 09:31 11/19/2012 12:59  Hemoglobin Latest Range:  12.0-15.0 g/dL 29.5 62.1 (L) 30.8 65.7 (L) 10.5 (L)  HCT Latest Range: 36.0-46.0 % 34.5 (L) 33.4 (L) 37.0 31.5 (L) 32.1 (L)    1830:  +IUP. No vaginal bleeding. +BV, will tx. H/H per baseline. No N/V while in the ED. Pt wants to go home now. Dx and testing d/w pt.  Questions answered.  Verb understanding, agreeable to d/c home with outpt f/u.     Laray Anger, DO 11/21/12 [phone]

## 2012-11-19 NOTE — ED Notes (Signed)
States has not had cycle since June 15 and abd pain with cramps, no emesis ,

## 2012-11-19 NOTE — ED Notes (Signed)
Abscess culture: R thigh:  Few MRSA.  Pt. treated with I and D and Keflex.  Lab shown to Dr. Artis Flock and he said to call for clinical improvement. I called and left pt. a message to call. [name] Hendrix 11/19/2012

## 2012-11-20 LAB — GC/CHLAMYDIA PROBE AMP
CT Probe RNA: NEGATIVE
GC Probe RNA: NEGATIVE

## 2012-11-21 ENCOUNTER — Telehealth (HOSPITAL_COMMUNITY): Payer: Self-pay | Admitting: *Deleted

## 2012-11-22 ENCOUNTER — Telehealth (HOSPITAL_COMMUNITY): Payer: Self-pay | Admitting: *Deleted

## 2012-12-05 ENCOUNTER — Telehealth (HOSPITAL_COMMUNITY): Payer: Self-pay | Admitting: *Deleted

## 2012-12-05 NOTE — ED Notes (Signed)
Pt. called on VM and said she got the letter about her results. I called pt. back and answered her questions.  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 I suggested she wear gloves, wash her hands frequently and bathe with antibacterial soap after work.  She said she is pregnant and [facility] and asked if it was OK to take the Keflex. She said, they said it was OK.  I asked if her wound had healed.  She said she still has a small brown spot, no swelling, pain, or drainage.  I told her the Keflex was not the best drug for MRSA, but the I and D is usually enough to clear it up.  I reviewed the [facility] MRSA instructions again with her. Vassie Moselle 12/05/2012

## 2013-01-22 LAB — OB RESULTS CONSOLE HEPATITIS B SURFACE ANTIGEN: Hepatitis B Surface Ag: NEGATIVE

## 2013-01-22 LAB — OB RESULTS CONSOLE RUBELLA ANTIBODY, IGM: RUBELLA: IMMUNE

## 2013-03-24 ENCOUNTER — Encounter (HOSPITAL_COMMUNITY): Payer: Self-pay | Admitting: *Deleted

## 2013-03-24 ENCOUNTER — Inpatient Hospital Stay (HOSPITAL_COMMUNITY)
Admission: AD | Admit: 2013-03-24 | Discharge: 2013-03-26 | DRG: 782 | Disposition: A | Discharge status: Home / Self Care | Payer: Medicaid Other | Source: Ambulatory Visit | Attending: Obstetrics | Admitting: Obstetrics

## 2013-03-24 DIAGNOSIS — O99330 Smoking (tobacco) complicating pregnancy, unspecified trimester: Secondary | ICD-10-CM | POA: Diagnosis present

## 2013-03-24 DIAGNOSIS — O34219 Maternal care for unspecified type scar from previous cesarean delivery: Secondary | ICD-10-CM | POA: Diagnosis present

## 2013-03-24 DIAGNOSIS — N930 Postcoital and contact bleeding: Secondary | ICD-10-CM | POA: Diagnosis present

## 2013-03-24 DIAGNOSIS — O4690 Antepartum hemorrhage, unspecified, unspecified trimester: Secondary | ICD-10-CM | POA: Diagnosis present

## 2013-03-24 DIAGNOSIS — O4410 Placenta previa with hemorrhage, unspecified trimester: Principal | ICD-10-CM | POA: Diagnosis present

## 2013-03-24 LAB — URINE MICROSCOPIC-ADD ON

## 2013-03-24 LAB — URINALYSIS, ROUTINE W REFLEX MICROSCOPIC
Glucose, UA: NEGATIVE mg/dL
Leukocytes, UA: NEGATIVE
pH: 6.5 (ref 5.0–8.0)

## 2013-03-24 NOTE — MAU Provider Note (Signed)
  History     CSN: [id number]  Arrival date and time: 03/24/13 2108   First Provider Initiated Contact with Patient 03/24/13 2151      No chief complaint on file.  HPI  [name] Hendrix is a 27 y.o. G3P2002 at 24 weeks who presents today with vaginal bleeding. She states that she had intercourse at 2015, and the bleeding started right afterwards. She states that she has a "low lying placenta". She states that she drove 3 hours today, and her back was hurting. She denies any LOF or contractions. She confirms fetal movement.   Past Medical History  Diagnosis Date  . Hypertension   . Kidney stones     Past Surgical History  Procedure Laterality Date  . Cesarean section      History reviewed. No pertinent family history.  History  Substance Use Topics  . Smoking status: Current Every Day Smoker -- 0.25 packs/day for 14 years    Types: Cigarettes  . Smokeless tobacco: Not on file  . Alcohol Use: No    Allergies: No Known Allergies  Prescriptions prior to admission  Medication Sig Dispense Refill  . ondansetron (ZOFRAN) 4 MG tablet Take 1 tablet (4 mg total) by mouth every 8 (eight) hours as needed for nausea.  10 tablet  0  . Prenatal Vit-Fe Fumarate-FA (PRENATAL MULTIVITAMIN) TABS tablet Take 1 tablet by mouth daily at 12 noon.        ROS Physical Exam   Blood pressure 132/85, pulse 103, temperature 99 F (37.2 C), resp. rate 18, height 5' (1.524 m), weight 61.236 kg (135 lb), SpO2 99.00%.  Physical Exam  Nursing note and vitals reviewed. Constitutional: She is oriented to person, place, and time. She appears well-developed and well-nourished. No distress.  Cardiovascular: Normal rate.   Respiratory: Effort normal.  GI: Soft. There is no tenderness.  Neurological: She is alert and oriented to person, place, and time.  Skin: Skin is warm and dry.  Psychiatric: She has a normal mood and affect.    FHT: 130, moderate, reactive for 24 weeks Toco: no UCs MAU  Course  Procedures  [place]: shows posterior previa Cervical length: 3.7cm  D/W Dr. Clearance Coots: will admit to antenatal, RN to transcribe orders.   Assessment and Plan  Admit to antenatal Bleeding with placenta previa   [name] Hendrix 03/24/2013, 9:51 PM

## 2013-03-24 NOTE — MAU Note (Signed)
Pt had intercourse tonight @ 2015 and started bleeding shortly afterwards.Pt drove 3 hrs today and has experienced low back and low abd pain while driving.  Pain in her abd has gone away.

## 2013-03-25 ENCOUNTER — Encounter (HOSPITAL_COMMUNITY): Payer: Self-pay | Admitting: *Deleted

## 2013-03-25 ENCOUNTER — Ambulatory Visit (HOSPITAL_COMMUNITY): Payer: Medicaid Other

## 2013-03-25 ENCOUNTER — Inpatient Hospital Stay (HOSPITAL_COMMUNITY): Payer: Medicaid Other

## 2013-03-25 DIAGNOSIS — O4690 Antepartum hemorrhage, unspecified, unspecified trimester: Secondary | ICD-10-CM | POA: Diagnosis present

## 2013-03-25 LAB — CBC
HCT: 32.2 % — ABNORMAL LOW (ref 36.0–46.0)
MCHC: 34.5 g/dL (ref 30.0–36.0)
MCV: 94.4 fL (ref 78.0–100.0)
Platelets: 220 10*3/uL (ref 150–400)
RBC: 3.41 MIL/uL — ABNORMAL LOW (ref 3.87–5.11)
RDW: 12.9 % (ref 11.5–15.5)
WBC: 11.6 10*3/uL — ABNORMAL HIGH (ref 4.0–10.5)

## 2013-03-25 MED ORDER — INFLUENZA VAC SPLIT QUAD 0.5 ML IM SUSP
0.5000 mL | INTRAMUSCULAR | Status: AC
  Administered 2013-03-26: 0.5 mL via INTRAMUSCULAR
  Filled 2013-03-25: qty 0.5

## 2013-03-25 MED ORDER — ACETAMINOPHEN 325 MG PO TABS: 650.0000 mg | ORAL_TABLET | ORAL | Status: DC | PRN

## 2013-03-25 MED ORDER — PRENATAL MULTIVITAMIN CH
1.0000 | ORAL_TABLET | Freq: Every day | ORAL | Status: DC
  Administered 2013-03-25 – 2013-03-26 (×2): 1 via ORAL
  Filled 2013-03-25 (×2): qty 1

## 2013-03-25 MED ORDER — ZOLPIDEM TARTRATE 5 MG PO TABS
5.0000 mg | ORAL_TABLET | Freq: Every evening | ORAL | Status: DC | PRN
  Administered 2013-03-26: 5 mg via ORAL
  Filled 2013-03-25: qty 1

## 2013-03-25 MED ORDER — DOCUSATE SODIUM 100 MG PO CAPS
100.0000 mg | ORAL_CAPSULE | Freq: Every day | ORAL | Status: DC
  Administered 2013-03-25 – 2013-03-26 (×2): 100 mg via ORAL
  Filled 2013-03-25 (×3): qty 1

## 2013-03-25 MED ORDER — CALCIUM CARBONATE ANTACID 500 MG PO CHEW: 2.0000 | CHEWABLE_TABLET | ORAL | Status: DC | PRN

## 2013-03-25 MED ORDER — ONDANSETRON HCL 4 MG PO TABS: 4.0000 mg | ORAL_TABLET | Freq: Three times a day (TID) | ORAL | Status: DC | PRN

## 2013-03-25 NOTE — Progress Notes (Signed)
Patient ID: [name] Hendrix, female   DOB: 10-15-1985, 27 y.o.   MRN: [id number] Vital signs normal No more vaginal bleeding at she had sex She had ultrasound at 18 weeks which showed low-lying placenta  LAST NIGHT SHOWED HIM POSTERIOR PLACENTA PREVIA NO COMPLAINTS TODAY

## 2013-03-25 NOTE — Consult Note (Signed)
MFM Consult  [name] Hendrix is a 27 year old G3P2 AA female at 24+2 weeks who was admitted for vaginal bleeding after IC yesterday. Patient stated dark red blood started "dripping out" after IC along with a few clots. Now, there is only minimal blood when she wipes. Good fetal movement with occasional mild, painless contractions. She thinks that anatomy US in Dr. Elsie Stain office showed a previa. No precautions given. Otherwise, pregnancy has been uncomplicated.  OB history: 1) 2008; emergency C/S at 39 weeks; female; 2) 2011; VBAC at 33 weeks; female Medical history: "episodes" of hypertension treated with meds on and off; kidney stones Surgical history: C/S x 1 Allergies: NKDA Social: 2 cigs/day; works as cashier  [place] on admission: Asymmetric posterior previa; normal AFV  Assessment: 1) IUP at 24+2 weeks 2) Asymmetric posterior previa; no evidence of abruption 3) Vaginal bleeding after IC secondary to previa  Recommend: 1) Hospital observation for 2-3 days after bleeding has stopped; then follow as out pt 2) [place] for growth prior to DC 3) Follow-up USs every 4-6 weeks for growth and placental location 4) Repeat C/S at 36-37 weeks if remains previa 5) Bleeding precautions  Thank you for the referral.  (Time with pt: 30 min)

## 2013-03-25 NOTE — H&P (Signed)
[name] Hendrix is a 27 y.o. female presenting for vaginal bleeding after intercourse. Maternal Medical History:  Reason for admission: Vaginal bleeding and nausea.  27 yo G3 P2.  EDC 07-13-13.  Presents with vaginal bleeding after intercourse.  Denies UC's.  Has a H/O low lying placenta, per patient.  Fetal activity: Perceived fetal activity is normal.    Prenatal complications: no prenatal complications Prenatal Complications - Diabetes: none.    OB History   Grav Para Term Preterm Abortions TAB SAB Ect Mult Living   5 2 2       2      Past Medical History  Diagnosis Date  . Hypertension   . Kidney stones    Past Surgical History  Procedure Laterality Date  . Cesarean section     Family History: family history is not on file. Social History:  reports that she has been smoking Cigarettes.  She has a 3.5 pack-year smoking history. She does not have any smokeless tobacco history on file. She reports that she does not drink alcohol or use illicit drugs.   Prenatal Transfer Tool  Maternal Diabetes: No Genetic Screening: Normal Maternal Ultrasounds/Referrals: Abnormal:  Findings:   Other: Low lying placenta. Fetal Ultrasounds or other Referrals:  None Maternal Substance Abuse:  No Significant Maternal Medications:  Zofran Significant Maternal Lab Results:  None Other Comments:  None  Review of Systems  Gastrointestinal: Positive for nausea.  All other systems reviewed and are negative.      Blood pressure 132/85, pulse 103, temperature 99 F (37.2 C), resp. rate 18, height 5' (1.524 m), weight 135 lb (61.236 kg), last menstrual period 10/06/2012, SpO2 99.00%, unknown if currently breastfeeding. Maternal Exam:  Abdomen: Patient reports no abdominal tenderness.   Physical Exam  Nursing note and vitals reviewed. Constitutional: She is oriented to person, place, and time. She appears well-developed and well-nourished.  HENT:  Head: Normocephalic.  Eyes: Conjunctivae are  normal. Pupils are equal, round, and reactive to light.  Neck: Normal range of motion. Neck supple.  Cardiovascular: Normal rate and regular rhythm.   Respiratory: Effort normal and breath sounds normal.  GI: Soft.  Neurological: She is alert and oriented to person, place, and time.  Skin: Skin is warm and dry.  Psychiatric: She has a normal mood and affect. Her behavior is normal. Judgment and thought content normal.    Prenatal labs: ABO, Rh:   Antibody:   Rubella:   RPR: NON REACTIVE (03/14 1500)  HBsAg:     HIV:    GBS:     Ultrasound:  Posterior Placenta Previa.  Cervical length 3.7 cm on transabdominal exam.  Assessment/Plan: 24.2 weeks.  Posterior placenta previa.  Vaginal bleeding after intercourse.  Stable.  Admit for observation.  MFM consult ordered.   HARPER,CHARLES A 03/25/2013, 2:21 AM

## 2013-03-26 MED ORDER — DSS 100 MG PO CAPS: 100.0000 mg | ORAL_CAPSULE | Freq: Every day | ORAL | Status: DC

## 2013-03-26 NOTE — Discharge Summary (Signed)
  Patient weeks pregnant was admitted because of bleeding after sex ultrasound showed and you and you and his at no more bleeding since she was admitted she been discharged home on bedrest no sex to see me in 2 weeks in

## 2013-03-26 NOTE — Progress Notes (Signed)
Patient ID: [name] Hendrix, female   DOB: [birth year], 27 y.o.   MRN: [id number] Vital signs normal Ultrasound confirms you and No bleeding and and a discharge today

## 2013-03-26 NOTE — Discharge Instructions (Signed)
Discharge instructions   You can wash your hair  Shower  Eat what you want  Drink what you want  See me in 6 weeks  Your ankles are going to swell more in the next 2 weeks than when pregnant  No sex for 6 weeks   Nikai Quest A, MD 03/26/2013

## 2013-03-26 NOTE — Progress Notes (Signed)
Pt. Is stable and ready to be discharged home. All discharge instructions given. All medications reviewed. All questions answered. Pt. Wheeled out with FOB to ride home with her mother. Pt. Will follow up in 2 weeks as scheduled with Dr. Gaynell Face

## 2013-04-01 LAB — TYPE AND SCREEN: Unit division: 0

## 2013-04-19 ENCOUNTER — Inpatient Hospital Stay (HOSPITAL_COMMUNITY)
Admission: AD | Admit: 2013-04-19 | Discharge: 2013-04-23 | DRG: 778 | Disposition: A | Discharge status: Home / Self Care | Payer: Medicaid Other | Source: Ambulatory Visit | Attending: Obstetrics | Admitting: Obstetrics

## 2013-04-19 ENCOUNTER — Encounter (HOSPITAL_COMMUNITY): Payer: Self-pay | Admitting: *Deleted

## 2013-04-19 ENCOUNTER — Inpatient Hospital Stay (HOSPITAL_COMMUNITY): Payer: Medicaid Other

## 2013-04-19 DIAGNOSIS — O4402 Placenta previa specified as without hemorrhage, second trimester: Secondary | ICD-10-CM

## 2013-04-19 DIAGNOSIS — O321XX0 Maternal care for breech presentation, not applicable or unspecified: Secondary | ICD-10-CM | POA: Diagnosis present

## 2013-04-19 DIAGNOSIS — O4400 Placenta previa specified as without hemorrhage, unspecified trimester: Secondary | ICD-10-CM | POA: Diagnosis present

## 2013-04-19 DIAGNOSIS — O4692 Antepartum hemorrhage, unspecified, second trimester: Secondary | ICD-10-CM

## 2013-04-19 DIAGNOSIS — O4410 Placenta previa with hemorrhage, unspecified trimester: Secondary | ICD-10-CM | POA: Diagnosis present

## 2013-04-19 DIAGNOSIS — O99330 Smoking (tobacco) complicating pregnancy, unspecified trimester: Secondary | ICD-10-CM | POA: Diagnosis present

## 2013-04-19 DIAGNOSIS — O4700 False labor before 37 completed weeks of gestation, unspecified trimester: Principal | ICD-10-CM | POA: Diagnosis present

## 2013-04-19 DIAGNOSIS — O4690 Antepartum hemorrhage, unspecified, unspecified trimester: Secondary | ICD-10-CM | POA: Diagnosis present

## 2013-04-19 LAB — CBC
HCT: 32.1 % — ABNORMAL LOW (ref 36.0–46.0)
Hemoglobin: 10.9 g/dL — ABNORMAL LOW (ref 12.0–15.0)
MCV: 95.3 fL (ref 78.0–100.0)
RBC: 3.37 MIL/uL — ABNORMAL LOW (ref 3.87–5.11)
RDW: 13.1 % (ref 11.5–15.5)
WBC: 11 10*3/uL — ABNORMAL HIGH (ref 4.0–10.5)

## 2013-04-19 LAB — TYPE AND SCREEN
ABO/RH(D): O POS
Antibody Screen: NEGATIVE

## 2013-04-19 LAB — FIBRINOGEN: Fibrinogen: 451 mg/dL (ref 204–475)

## 2013-04-19 MED ORDER — CALCIUM CARBONATE ANTACID 500 MG PO CHEW: 2.0000 | CHEWABLE_TABLET | ORAL | Status: DC | PRN

## 2013-04-19 MED ORDER — DOCUSATE SODIUM 100 MG PO CAPS
100.0000 mg | ORAL_CAPSULE | Freq: Every day | ORAL | Status: DC
  Administered 2013-04-20 – 2013-04-22 (×3): 100 mg via ORAL
  Filled 2013-04-19 (×3): qty 1

## 2013-04-19 MED ORDER — BETAMETHASONE SOD PHOS & ACET 6 (3-3) MG/ML IJ SUSP
12.0000 mg | INTRAMUSCULAR | Status: AC
  Administered 2013-04-19 – 2013-04-20 (×2): 12 mg via INTRAMUSCULAR
  Filled 2013-04-19 (×2): qty 2

## 2013-04-19 MED ORDER — ZOLPIDEM TARTRATE 5 MG PO TABS
5.0000 mg | ORAL_TABLET | Freq: Every evening | ORAL | Status: DC | PRN
  Administered 2013-04-20 – 2013-04-21 (×2): 5 mg via ORAL
  Filled 2013-04-19 (×2): qty 1

## 2013-04-19 MED ORDER — PRENATAL MULTIVITAMIN CH
1.0000 | ORAL_TABLET | Freq: Every day | ORAL | Status: DC
  Administered 2013-04-20 – 2013-04-22 (×3): 1 via ORAL
  Filled 2013-04-19 (×3): qty 1

## 2013-04-19 MED ORDER — MAGNESIUM SULFATE 40 G IN LACTATED RINGERS - SIMPLE
2.0000 g/h | INTRAVENOUS | Status: DC
  Administered 2013-04-19 – 2013-04-20 (×2): 2 g/h via INTRAVENOUS
  Filled 2013-04-19 (×2): qty 500

## 2013-04-19 MED ORDER — MAGNESIUM SULFATE BOLUS VIA INFUSION
4.0000 g | Freq: Once | INTRAVENOUS | Status: AC
  Administered 2013-04-19: 4 g via INTRAVENOUS
  Filled 2013-04-19: qty 500

## 2013-04-19 MED ORDER — LACTATED RINGERS IV SOLN
INTRAVENOUS | Status: DC
  Administered 2013-04-19 – 2013-04-21 (×4): via INTRAVENOUS

## 2013-04-19 MED ORDER — ACETAMINOPHEN 325 MG PO TABS: 650.0000 mg | ORAL_TABLET | ORAL | Status: DC | PRN

## 2013-04-19 MED ORDER — MAGNESIUM SULFATE 40 MG/ML IJ SOLN: 2.0000 g | Freq: Once | INTRAMUSCULAR | Status: DC

## 2013-04-19 NOTE — MAU Provider Note (Signed)
Chief Complaint:  Vaginal Bleeding   None     HPI: [name] Hendrix is a 27 y.o. G3P2002 at [redacted]w[redacted]d who presents to maternity admissions reporting bright red vaginal bleeding with onset 1 hour before arrival in MAU.  She was diagnosed with placenta previa on 03/25/13 and has been on pelvic rest since then.  She denies intermittent cramping but reports constant lower abdominal pressure.  She reports good fetal movement, denies LOF, vaginal itching/burning, urinary symptoms, h/a, dizziness, n/v, or fever/chills.     Past Medical History: Past Medical History  Diagnosis Date  . Hypertension   . Kidney stones     Past obstetric history: OB History  Gravida Para Term Preterm AB SAB TAB Ectopic Multiple Living  3 2 2       2     # Outcome Date GA Lbr Len/2nd Weight Sex Delivery Anes PTL Lv  3 CUR           2 TRM           1 TRM               Past Surgical History: Past Surgical History  Procedure Laterality Date  . Cesarean section      Family History: History reviewed. No pertinent family history.  Social History: History  Substance Use Topics  . Smoking status: Current Every Day Smoker -- 0.25 packs/day for 14 years    Types: Cigarettes  . Smokeless tobacco: Not on file  . Alcohol Use: No    Allergies: No Known Allergies  Meds:  Prescriptions prior to admission  Medication Sig Dispense Refill  . DiphenhydrAMINE HCl (BENADRYL PO) Take 1 capsule by mouth daily as needed (sleep).      Marland Kitchen ibuprofen (ADVIL,MOTRIN) 200 MG tablet Take 200 mg by mouth every 6 (six) hours as needed for mild pain or moderate pain.      . polyethylene glycol (MIRALAX / GLYCOLAX) packet Take 17 g by mouth daily.      . Prenatal Vit-Fe Fumarate-FA (PRENATAL MULTIVITAMIN) TABS tablet Take 1 tablet by mouth daily at 12 noon.        ROS: Pertinent findings in history of present illness.  Physical Exam  Blood pressure 113/64, pulse 85, temperature 98.7 F (37.1 C), temperature source Oral, resp. rate  16, height 5' 0.5" (1.537 m), weight 62.415 kg (137 lb 9.6 oz), last menstrual period 10/06/2012. GENERAL: Well-developed, well-nourished female in no acute distress.  HEENT: normocephalic HEART: normal rate RESP: normal effort ABDOMEN: Soft, non-tender, gravid appropriate for gestational age EXTREMITIES: Nontender, no edema NEURO: alert and oriented Speculum exam deferred r/t previa   Pt clothing examined and moderate amount of bright red bleeding noted on pt underwear and pants she was wearing.  FHT:  Baseline 135, moderate variability, accelerations present, no decelerations Contractions: occasional, mild to palpation   Labs: Results for orders placed during the hospital encounter of 04/19/13 (from the past 24 hour(s))  CBC     Status: Abnormal   Collection Time    04/19/13  4:15 PM      Result Value Range   WBC 11.0 (*) 4.0 - 10.5 K/uL   RBC 3.37 (*) 3.87 - 5.11 MIL/uL   Hemoglobin 10.9 (*) 12.0 - 15.0 g/dL   HCT 16.1 (*) 09.6 - 04.5 %   MCV 95.3  78.0 - 100.0 fL   MCH 32.3  26.0 - 34.0 pg   MCHC 34.0  30.0 - 36.0 g/dL   RDW 13.1  11.5 - 15.5 %   Platelets 218  150 - 400 K/uL    Imaging:  Preliminary report confirms posterior previa, fetus in breech position, fluid level WNL, cervical length 4.8 cm    Assessment: 1. Placenta previa antepartum in second trimester   2. Vaginal bleeding in pregnancy, second trimester     Plan: Consult Dr Tamela Oddi Admit to antepartum Betamethasone x2 doses in 24 hours IV access Clear liquids x6 hours, may advance if stable Continuous EFM and toco Pad counts     Medication List    ASK your doctor about these medications       BENADRYL PO  Take 1 capsule by mouth daily as needed (sleep).     ibuprofen 200 MG tablet  Commonly known as:  ADVIL,MOTRIN  Take 200 mg by mouth every 6 (six) hours as needed for mild pain or moderate pain.     polyethylene glycol packet  Commonly known as:  MIRALAX / GLYCOLAX  Take 17 g by  mouth daily.     prenatal multivitamin Tabs tablet  Take 1 tablet by mouth daily at 12 noon.        Sharen Counter Certified Nurse-Midwife 04/19/2013 5:27 PM

## 2013-04-19 NOTE — MAU Note (Signed)
Pt. Here due to dark red bleeding she noticed today after a nap. Blood was on her underwear. Did have this one time a month ago and was diagnosed with a previa at that time. Pt. Also notes that she feels pressure and aching in her vaginal area. Also feels this in her rectum at times. Denies any contractions or leakage of fluid.  Does have mucous discharge at times. Next appointment with Dr. Gaynell Face is April 30, 2013. Last saw Dr. Gaynell Face tuesday of this week.

## 2013-04-19 NOTE — MAU Note (Signed)
Patient states she has placenta previa. Woke up from her nap and her pants had dark blood on them. No active bleeding noted in triage. Patient is not wearing a pad and when wiped no blood.

## 2013-04-20 NOTE — Progress Notes (Signed)
RN called to room with complaints that she noted blood in urine, none noted on pad and noted to be lighter when wiping.

## 2013-04-20 NOTE — H&P (Signed)
Chief Complaint: Vaginal Bleeding    HPI: [name] Hendrix is a 27 y.o. G3P2002 at [redacted]w[redacted]d who presents to maternity admissions reporting bright red vaginal bleeding with onset 1 hour before arrival in MAU. She was diagnosed with placenta previa on 03/25/13 and has been on pelvic rest since then. She denies intermittent cramping but reports constant lower abdominal pressure. She reports good fetal movement, denies LOF, vaginal itching/burning, urinary symptoms, h/a, dizziness, n/v, or fever/chills.  Past Medical History:  Past Medical History   Diagnosis  Date   .  Hypertension    .  Kidney stones    Past obstetric history:  OB History   Gravida  Para  Term  Preterm  AB  SAB  TAB  Ectopic  Multiple  Living   3  2  2        2      #  Outcome  Date  GA  Lbr Len/2nd  Weight  Sex  Delivery  Anes  PTL  Lv   3  CUR            2  TRM            1  TRM              Past Surgical History:  Past Surgical History   Procedure  Laterality  Date   .  Cesarean section     Family History:  History reviewed. No pertinent family history.  Social History:  History   Substance Use Topics   .  Smoking status:  Current Every Day Smoker -- 0.25 packs/day for 14 years     Types:  Cigarettes   .  Smokeless tobacco:  Not on file   .  Alcohol Use:  No   Allergies: No Known Allergies  Meds:  Prescriptions prior to admission   Medication  Sig  Dispense  Refill   .  DiphenhydrAMINE HCl (BENADRYL PO)  Take 1 capsule by mouth daily as needed (sleep).     Marland Kitchen  ibuprofen (ADVIL,MOTRIN) 200 MG tablet  Take 200 mg by mouth every 6 (six) hours as needed for mild pain or moderate pain.     .  polyethylene glycol (MIRALAX / GLYCOLAX) packet  Take 17 g by mouth daily.     .  Prenatal Vit-Fe Fumarate-FA (PRENATAL MULTIVITAMIN) TABS tablet  Take 1 tablet by mouth daily at 12 noon.     ROS: Pertinent findings in history of present illness.  Physical Exam   Blood pressure 113/64, pulse 85, temperature 98.7 F (37.1 C),  temperature source Oral, resp. rate 16, height 5' 0.5" (1.537 m), weight 62.415 kg (137 lb 9.6 oz), last menstrual period 10/06/2012.  GENERAL: Well-developed, well-nourished female in no acute distress.  HEENT: normocephalic  HEART: normal rate  RESP: normal effort  ABDOMEN: Soft, non-tender, gravid appropriate for gestational age  EXTREMITIES: Nontender, no edema  NEURO: alert and oriented  Speculum exam deferred r/t previa  Pt clothing examined and moderate amount of bright red bleeding noted on pt underwear and pants she was wearing.  FHT: Baseline 135, moderate variability, accelerations present, no decelerations  Contractions: occasional, mild to palpation  Labs:  Results for orders placed during the hospital encounter of 04/19/13 (from the past 24 hour(s))   CBC Status: Abnormal    Collection Time    04/19/13 4:15 PM   Result  Value  Range    WBC  11.0 (*)  4.0 - 10.5 K/uL  RBC  3.37 (*)  3.87 - 5.11 MIL/uL    Hemoglobin  10.9 (*)  12.0 - 15.0 g/dL    HCT  40.9 (*)  81.1 - 46.0 %    MCV  95.3  78.0 - 100.0 fL    MCH  32.3  26.0 - 34.0 pg    MCHC  34.0  30.0 - 36.0 g/dL    RDW  91.4  78.2 - 95.6 %    Platelets  218  150 - 400 K/uL   Imaging:    Preliminary report confirms posterior previa, fetus in breech position, fluid level WNL, cervical length 4.8 cm  Assessment:   Placenta previa with an acute bleed; IUP @ [redacted]w[redacted]d    Hemodynamically stable  Plan:    Admit to antepartum  Betamethasone x2 doses in 24 hours  IV access  Clear liquids x6 hours, may advance if stable  Continuous EFM and toco  Pad counts/strict I&O T&S

## 2013-04-21 NOTE — Progress Notes (Signed)
Patient ID: Fannie Gathright, female   DOB: [birth year], 27 y.o.   MRN: [id number] Hospital Day: 3  S: Preterm labor symptoms: light bleeding  O: Blood pressure 100/48, pulse 102, temperature 98.2 F (36.8 C), temperature source Oral, resp. rate 18, height 5' (1.524 m), weight 62.143 kg (137 lb), last menstrual period 10/06/2012, SpO2 97.00%.   WGN:FAOZHYQM: 130 bpm, Variability: Good {> 6 bpm), Accelerations: Non-reactive but appropriate for gestational age and Decelerations: Absent Toco: None SVE: deferred  A/P- 27 y.o. admitted with:  Present on Admission:  . Placenta previa . Vaginal bleeding in pregnancy Acute bleed resolving  Pregnancy Complications: see above  Preterm labor management: on MgSO4 for neuroprophylaxis/tocolysis; will d/c Dating:  [redacted]w[redacted]d PNL Needed:  non3 FWB:  FHT reassuring PTL:  Minimal uterine contractions ROD: C-section without labor

## 2013-04-22 NOTE — Progress Notes (Signed)
UR completed 

## 2013-04-22 NOTE — Progress Notes (Signed)
Pt off the monitor after reassurring FHR  

## 2013-04-23 NOTE — Discharge Summary (Signed)
  This is a section hospitalizations this patient she's not placenta previa and 4 weeks ago is here with bleeding after sex amount of discharged was reminded not to participate in in in 6 activity however she did and started bleeding again and she's had no further bleeding since she was hospitalized to be discharged today to see me in one week instructed again important is to have sex and

## 2013-04-23 NOTE — Progress Notes (Signed)
MD notified of FHR strip and no new orders.  Continue with discharge instructions

## 2013-04-23 NOTE — Progress Notes (Signed)
Pt off the monitor after talking to MD about FH strip.

## 2013-05-01 ENCOUNTER — Other Ambulatory Visit (HOSPITAL_COMMUNITY): Payer: Self-pay | Admitting: Obstetrics

## 2013-05-01 DIAGNOSIS — O4412 Placenta previa with hemorrhage, second trimester: Secondary | ICD-10-CM

## 2013-05-02 ENCOUNTER — Inpatient Hospital Stay (HOSPITAL_COMMUNITY)
Admission: AD | Admit: 2013-05-02 | Discharge: 2013-05-02 | Disposition: A | Discharge status: Home / Self Care | Payer: Medicaid Other | Source: Ambulatory Visit | Attending: Obstetrics | Admitting: Obstetrics

## 2013-05-02 ENCOUNTER — Encounter (HOSPITAL_COMMUNITY): Payer: Self-pay

## 2013-05-02 DIAGNOSIS — O4400 Placenta previa specified as without hemorrhage, unspecified trimester: Secondary | ICD-10-CM | POA: Insufficient documentation

## 2013-05-02 DIAGNOSIS — N898 Other specified noninflammatory disorders of vagina: Secondary | ICD-10-CM | POA: Insufficient documentation

## 2013-05-02 DIAGNOSIS — A5901 Trichomonal vulvovaginitis: Secondary | ICD-10-CM | POA: Insufficient documentation

## 2013-05-02 DIAGNOSIS — O4700 False labor before 37 completed weeks of gestation, unspecified trimester: Secondary | ICD-10-CM | POA: Insufficient documentation

## 2013-05-02 DIAGNOSIS — O98819 Other maternal infectious and parasitic diseases complicating pregnancy, unspecified trimester: Secondary | ICD-10-CM | POA: Insufficient documentation

## 2013-05-02 LAB — URINALYSIS, ROUTINE W REFLEX MICROSCOPIC
BILIRUBIN URINE: NEGATIVE
Glucose, UA: NEGATIVE mg/dL
Ketones, ur: NEGATIVE mg/dL
Nitrite: NEGATIVE
PROTEIN: NEGATIVE mg/dL
Specific Gravity, Urine: 1.025 (ref 1.005–1.030)
UROBILINOGEN UA: 0.2 mg/dL (ref 0.0–1.0)
pH: 6 (ref 5.0–8.0)

## 2013-05-02 LAB — WET PREP, GENITAL
Clue Cells Wet Prep HPF POC: NONE SEEN
YEAST WET PREP: NONE SEEN

## 2013-05-02 LAB — URINE MICROSCOPIC-ADD ON

## 2013-05-02 MED ORDER — TERBUTALINE SULFATE 1 MG/ML IJ SOLN
INTRAMUSCULAR | Status: AC
  Filled 2013-05-02: qty 1

## 2013-05-02 MED ORDER — METRONIDAZOLE 500 MG PO TABS
ORAL_TABLET | ORAL | Status: AC
  Administered 2013-05-02: 18:00:00 1000 mg via ORAL
  Filled 2013-05-02: qty 2

## 2013-05-02 MED ORDER — ONDANSETRON 8 MG PO TBDP
8.0000 mg | ORAL_TABLET | Freq: Once | ORAL | Status: AC
  Administered 2013-05-02: 8 mg via ORAL

## 2013-05-02 MED ORDER — ONDANSETRON 8 MG PO TBDP
ORAL_TABLET | ORAL | Status: AC
Start: 2013-05-02 — End: 2013-05-02
  Administered 2013-05-02: 18:00:00 8 mg via ORAL
  Filled 2013-05-02: qty 1

## 2013-05-02 MED ORDER — ONDANSETRON 8 MG PO TBDP: 8.0000 mg | ORAL_TABLET | Freq: Once | ORAL | Status: DC

## 2013-05-02 MED ORDER — METRONIDAZOLE 500 MG PO TABS
1000.0000 mg | ORAL_TABLET | Freq: Once | ORAL | Status: AC
  Administered 2013-05-02: 1000 mg via ORAL

## 2013-05-02 MED ORDER — TERBUTALINE SULFATE 1 MG/ML IJ SOLN
0.2500 mg | Freq: Once | INTRAMUSCULAR | Status: AC
  Administered 2013-05-02: 0.25 mg via SUBCUTANEOUS
  Filled 2013-05-02: qty 1

## 2013-05-02 MED ORDER — LACTATED RINGERS IV BOLUS (SEPSIS)
1000.0000 mL | Freq: Once | INTRAVENOUS | Status: AC
  Administered 2013-05-02: 1000 mL via INTRAVENOUS

## 2013-05-02 MED ORDER — TERBUTALINE SULFATE 1 MG/ML IJ SOLN
0.2500 mg | Freq: Once | INTRAMUSCULAR | Status: AC
  Administered 2013-05-02: 0.25 mg via SUBCUTANEOUS

## 2013-05-02 NOTE — MAU Note (Signed)
Pt reports she started having clear mucusy discharge this morning when she woke up. C/o ctx on and off all night. Ctx now q 4-5 min. Pt has placenta previa and was discharged from hospital last week.

## 2013-05-02 NOTE — MAU Provider Note (Signed)
History     CSN: [id number]  Arrival date and time: 05/02/13 1335   First Provider Initiated Contact with Patient 05/02/13 1522      Chief Complaint  Patient presents with  . Vaginal Discharge   HPI Pt is G3P2002 @  [redacted]w[redacted]d pregnant with known placenta previa.  Pt has been hospitalized and has received BMZ 12/26 and 12/27.  She  Had Mag stopped 12/28.  Pt noticed watery vaginal discharge this morning.  Pt is concerned she may have ruptured her membranes. Pt denies any vaginal bleeding.  Pt has been on pelvic rest.  RN note: Pt reports she started having clear mucusy discharge this morning when she woke up. C/o ctx on and off all night. Ctx now q 4-5 min. Pt has placenta previa and was discharged from hospital last week.   Past Medical History  Diagnosis Date  . Hypertension   . Kidney stones     Past Surgical History  Procedure Laterality Date  . Cesarean section      History reviewed. No pertinent family history.  History  Substance Use Topics  . Smoking status: Current Every Day Smoker -- 0.25 packs/day for 14 years    Types: Cigarettes  . Smokeless tobacco: Not on file  . Alcohol Use: No    Allergies: No Known Allergies  Prescriptions prior to admission  Medication Sig Dispense Refill  . butalbital-acetaminophen-caffeine (FIORICET, ESGIC) 50-325-40 MG per tablet Take 2 tablets by mouth every 6 (six) hours as needed for headache.      . DiphenhydrAMINE HCl (BENADRYL PO) Take 1 capsule by mouth at bedtime as needed (For sleep).       . Prenatal Vit-Fe Fumarate-FA (PRENATAL MULTIVITAMIN) TABS tablet Take 1 tablet by mouth daily at 12 noon.        Review of Systems  Constitutional: Negative for fever and chills.  Gastrointestinal: Positive for nausea and abdominal pain. Negative for vomiting, diarrhea and constipation.  Genitourinary: Negative for urgency.   Physical Exam   Blood pressure 118/68, pulse 81, temperature 98.7 F (37.1 C), temperature source Oral,  resp. rate 18, height 5' (1.524 m), weight 62.506 kg (137 lb 12.8 oz), last menstrual period 10/06/2012.  Physical Exam  Nursing note and vitals reviewed. Constitutional: She is oriented to person, place, and time. She appears well-developed and well-nourished. No distress.  HENT:  Head: Normocephalic.  Eyes: Pupils are equal, round, and reactive to light.  Neck: Normal range of motion. Neck supple.  Cardiovascular: Normal rate.   Respiratory: Effort normal.  GI: Soft. She exhibits no distension. There is no tenderness. There is no rebound and no guarding.  Ctx every 3 minutes; FHR reactive  Genitourinary:  Mod amount of frothy yellow vaginal discharge in vault; cervix appears long and closed- bimanual deferred  Musculoskeletal: Normal range of motion.  Neurological: She is alert and oriented to person, place, and time.  Skin: Skin is warm and dry.  Psychiatric: She has a normal mood and affect.    MAU Course  Procedures Pt having ctx every 3 min- Terb 0.025mg  Re-evaluated- having irritability Terb repeated with great results- pt feeling good FHR reactive Trich noted on wet prep- Zofran and Flagyl given in MAU Results for orders placed during the hospital encounter of 05/02/13 (from the past 24 hour(s))  URINALYSIS, ROUTINE W REFLEX MICROSCOPIC     Status: Abnormal   Collection Time    05/02/13  2:50 PM      Result Value Range  Color, Urine YELLOW  YELLOW   APPearance HAZY (*) CLEAR   Specific Gravity, Urine 1.025  1.005 - 1.030   pH 6.0  5.0 - 8.0   Glucose, UA NEGATIVE  NEGATIVE mg/dL   Hgb urine dipstick TRACE (*) NEGATIVE   Bilirubin Urine NEGATIVE  NEGATIVE   Ketones, ur NEGATIVE  NEGATIVE mg/dL   Protein, ur NEGATIVE  NEGATIVE mg/dL   Urobilinogen, UA 0.2  0.0 - 1.0 mg/dL   Nitrite NEGATIVE  NEGATIVE   Leukocytes, UA MODERATE (*) NEGATIVE  URINE MICROSCOPIC-ADD ON     Status: Abnormal   Collection Time    05/02/13  2:50 PM      Result Value Range   Squamous  Epithelial / LPF FEW (*) RARE   WBC, UA 3-6  <3 WBC/hpf   RBC / HPF 0-2  <3 RBC/hpf  WET PREP, GENITAL     Status: Abnormal   Collection Time    05/02/13  3:30 PM      Result Value Range   Yeast Wet Prep HPF POC NONE SEEN  NONE SEEN   Trich, Wet Prep FEW (*) NONE SEEN   Clue Cells Wet Prep HPF POC NONE SEEN  NONE SEEN   WBC, Wet Prep HPF POC MODERATE (*) NONE SEEN  Fern negative Discussed with partner having partner treated for Trich Assessment and Plan  Threatened preterm labor Placenta previa F/u with OB appointment- sooner if bleeding Trich- Flagyl 1 gm given in MAU  Jushua Waltman 05/02/2013, 3:24 PM

## 2013-05-03 ENCOUNTER — Inpatient Hospital Stay (HOSPITAL_COMMUNITY)
Admission: AD | Admit: 2013-05-03 | Discharge: 2013-05-07 | DRG: 781 | Disposition: A | Discharge status: Home / Self Care | Payer: Medicaid Other | Source: Ambulatory Visit | Attending: Obstetrics | Admitting: Obstetrics

## 2013-05-03 ENCOUNTER — Encounter (HOSPITAL_COMMUNITY): Payer: Self-pay | Admitting: *Deleted

## 2013-05-03 ENCOUNTER — Ambulatory Visit (HOSPITAL_COMMUNITY)
Admission: RE | Admit: 2013-05-03 | Discharge: 2013-05-03 | Disposition: A | Discharge status: Home / Self Care | Payer: Medicaid Other | Source: Ambulatory Visit | Attending: Obstetrics | Admitting: Obstetrics

## 2013-05-03 DIAGNOSIS — N858 Other specified noninflammatory disorders of uterus: Secondary | ICD-10-CM

## 2013-05-03 DIAGNOSIS — O4700 False labor before 37 completed weeks of gestation, unspecified trimester: Secondary | ICD-10-CM | POA: Diagnosis present

## 2013-05-03 DIAGNOSIS — O4412 Placenta previa with hemorrhage, second trimester: Secondary | ICD-10-CM

## 2013-05-03 DIAGNOSIS — O4400 Placenta previa specified as without hemorrhage, unspecified trimester: Secondary | ICD-10-CM

## 2013-05-03 DIAGNOSIS — O98819 Other maternal infectious and parasitic diseases complicating pregnancy, unspecified trimester: Secondary | ICD-10-CM | POA: Diagnosis present

## 2013-05-03 DIAGNOSIS — N859 Noninflammatory disorder of uterus, unspecified: Secondary | ICD-10-CM

## 2013-05-03 DIAGNOSIS — O99330 Smoking (tobacco) complicating pregnancy, unspecified trimester: Secondary | ICD-10-CM | POA: Diagnosis present

## 2013-05-03 DIAGNOSIS — O4410 Placenta previa with hemorrhage, unspecified trimester: Principal | ICD-10-CM | POA: Diagnosis present

## 2013-05-03 DIAGNOSIS — A5901 Trichomonal vulvovaginitis: Secondary | ICD-10-CM | POA: Diagnosis present

## 2013-05-03 DIAGNOSIS — O4403 Placenta previa specified as without hemorrhage, third trimester: Secondary | ICD-10-CM

## 2013-05-03 LAB — CBC
HCT: 33 % — ABNORMAL LOW (ref 36.0–46.0)
HEMOGLOBIN: 11.2 g/dL — AB (ref 12.0–15.0)
MCH: 32.8 pg (ref 26.0–34.0)
MCHC: 33.9 g/dL (ref 30.0–36.0)
MCV: 96.8 fL (ref 78.0–100.0)
Platelets: 263 10*3/uL (ref 150–400)
RBC: 3.41 MIL/uL — ABNORMAL LOW (ref 3.87–5.11)
RDW: 13.3 % (ref 11.5–15.5)
WBC: 9.3 10*3/uL (ref 4.0–10.5)

## 2013-05-03 MED ORDER — LACTATED RINGERS IV SOLN
INTRAVENOUS | Status: DC
  Administered 2013-05-03 – 2013-05-05 (×3): via INTRAVENOUS

## 2013-05-03 MED ORDER — ZOLPIDEM TARTRATE 5 MG PO TABS
5.0000 mg | ORAL_TABLET | Freq: Every evening | ORAL | Status: DC | PRN
  Administered 2013-05-06 (×2): 5 mg via ORAL
  Filled 2013-05-03 (×2): qty 1

## 2013-05-03 MED ORDER — METRONIDAZOLE 500 MG PO TABS
1000.0000 mg | ORAL_TABLET | Freq: Once | ORAL | Status: AC
  Administered 2013-05-03: 1000 mg via ORAL
  Filled 2013-05-03: qty 2

## 2013-05-03 MED ORDER — ACETAMINOPHEN 325 MG PO TABS: 650.0000 mg | ORAL_TABLET | ORAL | Status: DC | PRN

## 2013-05-03 MED ORDER — CALCIUM CARBONATE ANTACID 500 MG PO CHEW: 2.0000 | CHEWABLE_TABLET | ORAL | Status: DC | PRN

## 2013-05-03 MED ORDER — DOCUSATE SODIUM 100 MG PO CAPS
100.0000 mg | ORAL_CAPSULE | Freq: Every day | ORAL | Status: DC
  Administered 2013-05-04 – 2013-05-06 (×3): 100 mg via ORAL
  Filled 2013-05-03 (×3): qty 1

## 2013-05-03 MED ORDER — PRENATAL MULTIVITAMIN CH
1.0000 | ORAL_TABLET | Freq: Every day | ORAL | Status: DC
  Administered 2013-05-04 – 2013-05-07 (×4): 1 via ORAL
  Filled 2013-05-03 (×4): qty 1

## 2013-05-03 MED ORDER — BUTALBITAL-APAP-CAFFEINE 50-325-40 MG PO TABS
2.0000 | ORAL_TABLET | Freq: Four times a day (QID) | ORAL | Status: DC | PRN
  Administered 2013-05-03 – 2013-05-05 (×4): 2 via ORAL
  Filled 2013-05-03 (×4): qty 2

## 2013-05-03 NOTE — MAU Note (Signed)
Ambulatory to MAU from EMS, states she has a complete previa and had onset of vaginal bleeding at 2115

## 2013-05-03 NOTE — MAU Provider Note (Signed)
History     CSN: [id number]  Arrival date and time: 05/03/13 2200   First Provider Initiated Contact with Patient 05/03/13 2234      Chief Complaint  Patient presents with  . Vaginal Bleeding   HPI This is a 28 y.o. female at 36110w6d who presents with c/o heavy vaginal bleeding this evening. Had a strong contraction and then "a lot of watery blood poured out".  Feels small contractions now. Denies intercourse. Was treated yesterday for vaginal discharge (ruled out rupture of membranes) and for Trich (but was only given one gram of Flagyl).  Normal fetal movement.   RN NOte:  Ambulatory to MAU from EMS, states she has a complete previa and had onset of vaginal bleeding at 2115       OB History   Grav Para Term Preterm Abortions TAB SAB Ect Mult Living   3 2 2       2       Past Medical History  Diagnosis Date  . Hypertension   . Kidney stones     Past Surgical History  Procedure Laterality Date  . Cesarean section      History reviewed. No pertinent family history.  History  Substance Use Topics  . Smoking status: Current Every Day Smoker -- 0.25 packs/day for 14 years    Types: Cigarettes  . Smokeless tobacco: Not on file  . Alcohol Use: No    Allergies: No Known Allergies  Prescriptions prior to admission  Medication Sig Dispense Refill  . butalbital-acetaminophen-caffeine (FIORICET, ESGIC) 50-325-40 MG per tablet Take 2 tablets by mouth every 6 (six) hours as needed for headache.      . DiphenhydrAMINE HCl (BENADRYL PO) Take 1 capsule by mouth at bedtime as needed (For sleep).       . ondansetron (ZOFRAN-ODT) 8 MG disintegrating tablet Take 1 tablet (8 mg total) by mouth once.  20 tablet  0  . Prenatal Vit-Fe Fumarate-FA (PRENATAL MULTIVITAMIN) TABS tablet Take 1 tablet by mouth daily at 12 noon.        Review of Systems  Constitutional: Negative for fever, chills and malaise/fatigue.  Gastrointestinal: Positive for abdominal pain. Negative for nausea,  vomiting, diarrhea and constipation.  Genitourinary: Negative for dysuria.       Vaginal bleeding  Neurological: Positive for headaches. Negative for dizziness.   Physical Exam   Blood pressure 114/73, pulse 95, temperature 98.6 F (37 C), temperature source Oral, resp. rate 18, height 5' (1.524 m), weight 62.143 kg (137 lb), last menstrual period 10/06/2012, SpO2 97.00%.  Physical Exam  Constitutional: She is oriented to person, place, and time. She appears well-developed and well-nourished. No distress.  HENT:  Head: Normocephalic.  Cardiovascular: Normal rate.   Respiratory: Effort normal.  GI: Soft. She exhibits no distension. There is no tenderness. There is no rebound and no guarding.  Genitourinary: Uterus normal. Vaginal discharge (3-4cm long spot of blood on pad, no active bleeding) found.  Musculoskeletal: Normal range of motion.  Neurological: She is alert and oriented to person, place, and time.  Skin: Skin is warm and dry.  Psychiatric: She has a normal mood and affect.   FHR 145 with accels and average variability  Uterine irritability noted MAU Course  Procedures  MDM Discussed with DR Tamela OddiJackson-Moore  Assessment and Plan  A:  SIUP at 24110w6d       Known Complete placenta previa      Bleeding      Uterine irritability  Trichomonas  P:  Admit to Antenatal      Routine orders      No tocolysis now      IV hydration      Has already had B-Methasone series in December  [facility] 05/03/2013, 10:52 PM

## 2013-05-04 ENCOUNTER — Inpatient Hospital Stay (HOSPITAL_COMMUNITY): Payer: Medicaid Other

## 2013-05-04 ENCOUNTER — Encounter (HOSPITAL_COMMUNITY): Payer: Self-pay | Admitting: *Deleted

## 2013-05-04 LAB — TYPE AND SCREEN
ABO/RH(D): O POS
Antibody Screen: NEGATIVE

## 2013-05-04 MED ORDER — MAGNESIUM SULFATE BOLUS VIA INFUSION
4.0000 g | Freq: Once | INTRAVENOUS | Status: AC
  Administered 2013-05-04: 4 g via INTRAVENOUS
  Filled 2013-05-04: qty 500

## 2013-05-04 MED ORDER — MAGNESIUM SULFATE 40 G IN LACTATED RINGERS - SIMPLE
1.0000 g/h | INTRAVENOUS | Status: DC
  Filled 2013-05-04: qty 500

## 2013-05-04 NOTE — Progress Notes (Signed)
Patient ID: [name] Hendrix, female   DOB: 06-Oct-1985, 28 y.o.   MRN: [id number] Hospital Day: 2  S: Preterm labor symptoms: light bleeding  O: Blood pressure 101/43, pulse 86, temperature 98.5 F (36.9 C), temperature source Oral, resp. rate 18, height 5' (1.524 m), weight 63.05 kg (139 lb), last menstrual period 10/06/2012, SpO2 97.00%.   VOZ:DGUYQIHKFHT:Baseline: 140 bpm, Variability: Good {> 6 bpm), Accelerations: Non-reactive but appropriate for gestational age and Decelerations: Absent Toco: None SVE: deferred; pad w/scant brown blood  A/P- 28 y.o. admitted with:  Present on Admission:  . Placenta previa antepartum in third trimester--h/o multiple bleeding episodes.  Current episode tapering off. U/S for growth Pregnancy Complications: preterm labor  and see above  Preterm labor management: bedrest Dating:  1961w0d FWB:  FHT c/w fetal well-being PTL:  See above ROD: C-section without labor

## 2013-05-04 NOTE — H&P (Signed)
Chief Complaint: Vaginal Bleeding    HPI: [name] Hendrix is a 28 y.o. G3P2002 at [redacted]w[redacted]d  who presents to maternity admissions reporting bright red. She was diagnosed with placenta previa on 03/25/13 and has had multiple admissions for bleeding. She received a course of steroids during the most recent admission.  Past Medical History:  Past Medical History   Diagnosis  Date   .  Hypertension    .  Kidney stones    Past obstetric history:  OB History   Gravida  Para  Term  Preterm  AB  SAB  TAB  Ectopic  Multiple  Living   3  2  2        2      #  Outcome  Date  GA  Lbr Len/2nd  Weight  Sex  Delivery  Anes  PTL  Lv   3  CUR            2  TRM            1  TRM              Past Surgical History:  Past Surgical History   Procedure  Laterality  Date   .  Cesarean section     Family History:  History reviewed. No pertinent family history.  Social History:  History   Substance Use Topics   .  Smoking status:  Current Every Day Smoker -- 0.25 packs/day for 14 years     Types:  Cigarettes   .  Smokeless tobacco:  Not on file   .  Alcohol Use:  No   Allergies: No Known Allergies  Meds:  Prescriptions prior to admission   Medication  Sig  Dispense  Refill   .  DiphenhydrAMINE HCl (BENADRYL PO)  Take 1 capsule by mouth daily as needed (sleep).     Marland Kitchen  ibuprofen (ADVIL,MOTRIN) 200 MG tablet  Take 200 mg by mouth every 6 (six) hours as needed for mild pain or moderate pain.     .  polyethylene glycol (MIRALAX / GLYCOLAX) packet  Take 17 g by mouth daily.     .  Prenatal Vit-Fe Fumarate-FA (PRENATAL MULTIVITAMIN) TABS tablet  Take 1 tablet by mouth daily at 12 noon.     ROS: Pertinent findings in history of present illness.  Physical Exam    Filed Vitals:   05/03/13 2205 05/04/13 0000 05/04/13 0030  BP: 114/73 97/82   Pulse: 95 70 67  Temp: 98.6 F (37 C) 98.1 F (36.7 C)   TempSrc: Oral Oral   Resp: 18 18   Height: 5' (1.524 m) 5' (1.524 m)   Weight: 62.143 kg (137 lb) 63.05 kg  (139 lb)   SpO2: 97%  98%  .cbc GENERAL: Well-developed, well-nourished female in no acute distress.  HEENT: normocephalic  HEART: normal rate  RESP: normal effort  ABDOMEN: Soft, non-tender, gravid appropriate for gestational age  EXTREMITIES: Nontender, no edema  NEURO: alert and oriented   Pt clothing examined and moderate amount of bright red bleeding noted on pt underwear FHT: Baseline 135, moderate variability, accelerations present, no decelerations  Contractions: occasional, mild to palpation  Labs:  CBC    Component Value Date/Time   WBC 9.3 05/03/2013 2255   RBC 3.41* 05/03/2013 2255   HGB 11.2* 05/03/2013 2255   HCT 33.0* 05/03/2013 2255   PLT 263 05/03/2013 2255   MCV 96.8 05/03/2013 2255   MCH 32.8 05/03/2013 2255  MCHC 33.9 05/03/2013 2255   RDW 13.3 05/03/2013 2255   LYMPHSABS 2.6 11/19/2012 1259   MONOABS 0.6 11/19/2012 1259   EOSABS 0.2 11/19/2012 1259   BASOSABS 0.0 11/19/2012 1259    Assessment:   Placenta previa with an acute bleed; IUP @ 4064w0d     Hemodynamically stable  Plan:    Admit to antepartum  MgSO4 for neuroprophylaxis IV access  Clear liquids x6 hours, may advance if stable  Continuous EFM and toco  Pad counts/strict I&O T&S

## 2013-05-05 MED ORDER — SODIUM CHLORIDE 0.9 % IJ SOLN
3.0000 mL | Freq: Two times a day (BID) | INTRAMUSCULAR | Status: DC
  Administered 2013-05-05 – 2013-05-06 (×3): 3 mL via INTRAVENOUS

## 2013-05-05 NOTE — Progress Notes (Signed)
Patient ID: [name] Hendrix, female   DOB: [birth year], 29 y.o.   MRN: [id number] Hospital Day: 3  S: Preterm labor symptoms: light bleeding  O: Blood pressure 99/63, pulse 85, temperature 98.4 F (36.9 C), temperature source Oral, resp. rate 18, height 5' (1.524 m), weight 63.05 kg (139 lb), last menstrual period 10/06/2012, SpO2 97.00%.   WUJ:WJXBJYNWFHT:Baseline: 140 bpm, Variability: Good {> 6 bpm), Accelerations: Non-reactive but appropriate for gestational age and Decelerations: Absent Toco: None SVE: deferred; pad w/scant brown blood  A/P- 28 y.o. admitted with:  Present on Admission:  . Placenta previa antepartum in third trimester--h/o multiple bleeding episodes.  Current episode tapering off. Review U/S for growth report Pregnancy Complications: preterm labor  and see above  Preterm labor management: bedrest Dating:  1663w1d  FWB:  FHT c/w fetal well-being PTL:  See above ROD: C-section without labor

## 2013-05-06 LAB — TYPE AND SCREEN
ABO/RH(D): O POS
Antibody Screen: NEGATIVE

## 2013-05-06 NOTE — Progress Notes (Signed)
Pt off the monitor after reassurring FHR  

## 2013-05-06 NOTE — Progress Notes (Signed)
Ur chart review completed.  

## 2013-05-06 NOTE — Progress Notes (Signed)
Patient ID: [name] CongoLanika Dimmick, female   DOB: 10-02-1985, 28 y.o.   MRN: [id number] Vital signs normal no more bleeding since admission Her contractions have stopped We'll continue observation

## 2013-05-07 NOTE — Discharge Instructions (Signed)
Discharge instructions   You can wash your hair  Shower  Eat what you want  Drink what you want  Your ankles are going to swell more in the next 2 weeks than when pregnant  No sex for 6 weeks   MARSHALL,BERNARD A, MD 05/07/2013      Pelvic Rest Pelvic rest is sometimes recommended for women when:   The placenta is partially or completely covering the opening of the cervix (placenta previa).  There is bleeding between the uterine wall and the amniotic sac in the first trimester (subchorionic hemorrhage).  The cervix begins to open without labor starting (incompetent cervix, cervical insufficiency).  The labor is too early (preterm labor). HOME CARE INSTRUCTIONS  Do not have sexual intercourse, stimulation, or an orgasm.  Do not use tampons, douche, or put anything in the vagina.  Do not lift anything over 10 pounds (4.5 kg).  Avoid strenuous activity or straining your pelvic muscles. SEEK MEDICAL CARE IF:  You have any vaginal bleeding during pregnancy. Treat this as a potential emergency.  You have cramping pain felt low in the stomach (stronger than menstrual cramps).  You notice vaginal discharge (watery, mucus, or bloody).  You have a low, dull backache.  There are regular contractions or uterine tightening. SEEK IMMEDIATE MEDICAL CARE IF: You have vaginal bleeding and have placenta previa.  Document Released: 08/06/2010 Document Revised: 07/04/2011 Document Reviewed: 08/06/2010 [facility] Patient Information 2014 [employer], [place].

## 2013-05-07 NOTE — Discharge Summary (Signed)
  Third hospital admission for this patient with a posterior placenta previa she started having contractions at home and started having some bleeding when she was admitted to the hospital the bleeding stopped right she's had no contractions or bleeding since she's been here she was started on Procardia 10 mg every 6 hours total bedrest no sex and she been discharged today on the same to see me in a week and

## 2013-05-07 NOTE — Progress Notes (Signed)
Discharged via wheelchair with family member.

## 2013-05-29 ENCOUNTER — Other Ambulatory Visit (HOSPITAL_COMMUNITY): Payer: Self-pay | Admitting: Obstetrics

## 2013-05-29 DIAGNOSIS — O2690 Pregnancy related conditions, unspecified, unspecified trimester: Secondary | ICD-10-CM

## 2013-06-05 ENCOUNTER — Other Ambulatory Visit (HOSPITAL_COMMUNITY): Payer: Self-pay | Admitting: Obstetrics

## 2013-06-05 ENCOUNTER — Ambulatory Visit (HOSPITAL_COMMUNITY)
Admission: RE | Admit: 2013-06-05 | Discharge: 2013-06-05 | Disposition: A | Discharge status: Home / Self Care | Payer: Medicaid Other | Source: Ambulatory Visit | Attending: Obstetrics | Admitting: Obstetrics

## 2013-06-05 DIAGNOSIS — O2690 Pregnancy related conditions, unspecified, unspecified trimester: Secondary | ICD-10-CM

## 2013-06-05 DIAGNOSIS — O34219 Maternal care for unspecified type scar from previous cesarean delivery: Secondary | ICD-10-CM | POA: Insufficient documentation

## 2013-06-05 DIAGNOSIS — O4410 Placenta previa with hemorrhage, unspecified trimester: Secondary | ICD-10-CM | POA: Insufficient documentation

## 2013-06-05 DIAGNOSIS — O99330 Smoking (tobacco) complicating pregnancy, unspecified trimester: Secondary | ICD-10-CM | POA: Insufficient documentation

## 2013-06-05 DIAGNOSIS — O43899 Other placental disorders, unspecified trimester: Secondary | ICD-10-CM | POA: Insufficient documentation

## 2013-06-06 ENCOUNTER — Other Ambulatory Visit: Payer: Self-pay | Admitting: Obstetrics

## 2013-06-09 ENCOUNTER — Encounter (HOSPITAL_COMMUNITY): Payer: Self-pay

## 2013-06-09 ENCOUNTER — Inpatient Hospital Stay (HOSPITAL_COMMUNITY)
Admission: AD | Admit: 2013-06-09 | Discharge: 2013-06-09 | Disposition: A | Discharge status: Home / Self Care | Payer: Medicaid Other | Source: Ambulatory Visit | Attending: Obstetrics | Admitting: Obstetrics

## 2013-06-09 DIAGNOSIS — R51 Headache: Secondary | ICD-10-CM | POA: Insufficient documentation

## 2013-06-09 DIAGNOSIS — O34219 Maternal care for unspecified type scar from previous cesarean delivery: Secondary | ICD-10-CM | POA: Insufficient documentation

## 2013-06-09 DIAGNOSIS — R519 Headache, unspecified: Secondary | ICD-10-CM

## 2013-06-09 DIAGNOSIS — O4400 Placenta previa specified as without hemorrhage, unspecified trimester: Secondary | ICD-10-CM

## 2013-06-09 DIAGNOSIS — O4700 False labor before 37 completed weeks of gestation, unspecified trimester: Secondary | ICD-10-CM | POA: Insufficient documentation

## 2013-06-09 DIAGNOSIS — O368190 Decreased fetal movements, unspecified trimester, not applicable or unspecified: Secondary | ICD-10-CM | POA: Insufficient documentation

## 2013-06-09 DIAGNOSIS — O26899 Other specified pregnancy related conditions, unspecified trimester: Secondary | ICD-10-CM

## 2013-06-09 LAB — URINALYSIS, ROUTINE W REFLEX MICROSCOPIC
BILIRUBIN URINE: NEGATIVE
Glucose, UA: NEGATIVE mg/dL
Hgb urine dipstick: NEGATIVE
Ketones, ur: NEGATIVE mg/dL
NITRITE: NEGATIVE
PROTEIN: NEGATIVE mg/dL
SPECIFIC GRAVITY, URINE: 1.01 (ref 1.005–1.030)
Urobilinogen, UA: 0.2 mg/dL (ref 0.0–1.0)
pH: 7 (ref 5.0–8.0)

## 2013-06-09 LAB — URINE MICROSCOPIC-ADD ON

## 2013-06-09 MED ORDER — LACTATED RINGERS IV BOLUS (SEPSIS)
1000.0000 mL | Freq: Once | INTRAVENOUS | Status: AC
  Administered 2013-06-09: 1000 mL via INTRAVENOUS

## 2013-06-09 MED ORDER — DEXAMETHASONE SODIUM PHOSPHATE 10 MG/ML IJ SOLN
10.0000 mg | Freq: Once | INTRAMUSCULAR | Status: AC
  Administered 2013-06-09: 10 mg via INTRAVENOUS
  Filled 2013-06-09: qty 1

## 2013-06-09 MED ORDER — METOCLOPRAMIDE HCL 5 MG/ML IJ SOLN
10.0000 mg | Freq: Once | INTRAMUSCULAR | Status: AC
  Administered 2013-06-09: 10 mg via INTRAVENOUS
  Filled 2013-06-09: qty 2

## 2013-06-09 MED ORDER — PROMETHAZINE HCL 25 MG/ML IJ SOLN
25.0000 mg | Freq: Once | INTRAMUSCULAR | Status: AC
  Administered 2013-06-09: 25 mg via INTRAVENOUS
  Filled 2013-06-09: qty 1

## 2013-06-09 NOTE — MAU Provider Note (Signed)
  History     CSN: [id number]  Arrival date and time: 06/09/13 1805   First Provider Initiated Contact with Patient 06/09/13 1846      Chief Complaint  Patient presents with  . Contractions  . Headache  . Nausea   HPI [name] Hendrix is a 28 y.o. G3P2002 at 3219w1d. She presents with c/o headache since 10:30 last evening. She is nauseated, has vomited x 1. Has hx headaches since mid 20's. She has taken Tylenol #3 and Fiorcette, eases, but no relief. She called Dr Gaynell FaceMarshall, he said could take up to 2 every 3-4 hr, not helping either. Her L eye is sore, denies visual changes. Has eaten breakfast and lunch today. She has had contractions since 2/11, no leaking or bleeding. Has known posterior previa with marginal cord insertion. She is on Procardia 10 mg Q 6 hr, hasn't taken it since yesterday due to ha.     Past Medical History  Diagnosis Date  . Hypertension   . Kidney stones     Past Surgical History  Procedure Laterality Date  . Cesarean section      Family History  Problem Relation Age of Onset  . Diabetes Maternal Grandfather   . Hypertension Maternal Grandfather     History  Substance Use Topics  . Smoking status: Current Every Day Smoker -- 0.25 packs/day for 14 years    Types: Cigarettes  . Smokeless tobacco: Former NeurosurgeonUser  . Alcohol Use: No    Allergies: No Known Allergies  Prescriptions prior to admission  Medication Sig Dispense Refill  . acetaminophen-codeine (TYLENOL #3) 300-30 MG per tablet Take 1 tablet by mouth every 4 (four) hours as needed for moderate pain.      . butalbital-acetaminophen-caffeine (FIORICET, ESGIC) 50-325-40 MG per tablet Take 1 tablet by mouth as needed for headache.      Marland Kitchen. NIFEdipine (PROCARDIA) 10 MG capsule Take 10 mg by mouth every 6 (six) hours.      . Prenatal Vit-Fe Fumarate-FA (PRENATAL MULTIVITAMIN) TABS tablet Take 1 tablet by mouth daily at 12 noon.        Review of Systems  Constitutional: Positive for malaise/fatigue.  Negative for fever and chills.  Eyes: Positive for pain. Negative for blurred vision and double vision.  Gastrointestinal: Positive for nausea and vomiting.  Genitourinary: Negative for dysuria, urgency and frequency.  Neurological: Positive for headaches.   Physical Exam   Blood pressure 116/72, pulse 89, temperature 98 F (36.7 C), temperature source Oral, resp. rate 18, last menstrual period 10/06/2012.  Physical Exam  Constitutional: She is oriented to person, place, and time. She appears well-developed and well-nourished. She appears distressed.  GI: Soft. She exhibits no distension. There is no tenderness.  Musculoskeletal: Normal range of motion.  Neurological: She is alert and oriented to person, place, and time.  Skin: Skin is warm and dry.  Psychiatric: She has a normal mood and affect. Her behavior is normal.    MAU Course  Procedures   2000- headaches almost completely resolved, she is feeling better. No contractions, reactive strip. Consulted with Dr Gaynell FaceMarshall, pt may go home. F/u up in office as schedueld.    Assessment and Plan  35 1/[redacted] wks EGA with headache, resolved after tx Placenta previa with marginal cord insertion Reactive strip    Onix Jumper M. 06/09/2013, 6:56 PM

## 2013-06-09 NOTE — MAU Note (Signed)
Pt presents complaining of contractions starting last night and worsening today. Denies vaginal bleeding. Reports decreased fetal movement today

## 2013-06-11 ENCOUNTER — Encounter (HOSPITAL_COMMUNITY): Payer: Self-pay | Admitting: Pharmacist

## 2013-06-12 NOTE — H&P (Signed)
NAME:  [name] Hendrix, [name]             ACCOUNT NO.:  [id number]  MEDICAL RECORD NO.:  [id number]  LOCATION:  [facility]                          [facility]ACILITY:  [facility]  PHYSICIAN:  Kathreen CosierBernard A. Marshall, M.D.DATE OF BIRTH:  08-11-1985  DATE OF ADMISSION:  06/09/2013 DATE OF DISCHARGE:  06/09/2013                             HISTORY & PHYSICAL   HISTORY OF PRESENT ILLNESS:  The patient is a 28 year old, gravida 3, para 2-0-0-2, whose due date is July 13, 2013.  During this pregnancy it has been complicated by a posterior placenta previa, resulting in her having been hospitalized 3 times.  She has not had any recent bleeding. She also has a history of headaches treated with Fioricet 50, 30, 40. The patient also desires sterilization. The patient has been followed by Maternal Fetal Medicine.  PAST MEDICAL HISTORY:  Negative.  PAST SURGICAL HISTORY:  She has had a C-section in the past.  SOCIAL HISTORY:  Negative.  SYSTEM REVIEW:  Noncontributory.  PHYSICAL EXAMINATION:  GENERAL:  Well-developed female, in no distress. HEENT:  Negative. LUNGS:  Clear to P and A. HEART:  Regular rhythm.  No murmurs, no gallops. BREASTS:  No masses. ABDOMEN:  A 38-week size. PELVIC:  Deferred. EXTREMITIES:  Negative.          ______________________________ Kathreen CosierBernard A. Marshall, M.D.     BAM/MEDQ  D:  06/12/2013  T:  06/12/2013  Job:  [id number]

## 2013-06-16 ENCOUNTER — Encounter (HOSPITAL_COMMUNITY): Payer: Self-pay | Admitting: *Deleted

## 2013-06-16 ENCOUNTER — Inpatient Hospital Stay (HOSPITAL_COMMUNITY)
Admission: AD | Admit: 2013-06-16 | Discharge: 2013-06-16 | Disposition: A | Discharge status: Home / Self Care | Payer: Medicaid Other | Source: Ambulatory Visit | Attending: Obstetrics | Admitting: Obstetrics

## 2013-06-16 DIAGNOSIS — O4403 Placenta previa specified as without hemorrhage, third trimester: Secondary | ICD-10-CM

## 2013-06-16 DIAGNOSIS — O99330 Smoking (tobacco) complicating pregnancy, unspecified trimester: Secondary | ICD-10-CM | POA: Insufficient documentation

## 2013-06-16 DIAGNOSIS — O4400 Placenta previa specified as without hemorrhage, unspecified trimester: Secondary | ICD-10-CM | POA: Insufficient documentation

## 2013-06-16 DIAGNOSIS — R109 Unspecified abdominal pain: Secondary | ICD-10-CM | POA: Insufficient documentation

## 2013-06-16 DIAGNOSIS — R197 Diarrhea, unspecified: Secondary | ICD-10-CM

## 2013-06-16 LAB — URINALYSIS, ROUTINE W REFLEX MICROSCOPIC
BILIRUBIN URINE: NEGATIVE
GLUCOSE, UA: NEGATIVE mg/dL
HGB URINE DIPSTICK: NEGATIVE
Ketones, ur: NEGATIVE mg/dL
Leukocytes, UA: NEGATIVE
Nitrite: NEGATIVE
Protein, ur: NEGATIVE mg/dL
SPECIFIC GRAVITY, URINE: 1.02 (ref 1.005–1.030)
Urobilinogen, UA: 0.2 mg/dL (ref 0.0–1.0)
pH: 6.5 (ref 5.0–8.0)

## 2013-06-16 MED ORDER — DIPHENOXYLATE-ATROPINE 2.5-0.025 MG PO TABS: 1.0000 | ORAL_TABLET | Freq: Four times a day (QID) | ORAL | Status: DC | PRN

## 2013-06-16 NOTE — Discharge Instructions (Signed)
Diet for Diarrhea, Adult Frequent, runny stools (diarrhea) may be caused or worsened by food or drink. Diarrhea may be relieved by changing your diet. Since diarrhea can last up to 7 days, it is easy for you to lose too much fluid from the body and become dehydrated. Fluids that are lost need to be replaced. Along with a modified diet, make sure you drink enough fluids to keep your urine clear or pale yellow. DIET INSTRUCTIONS  Ensure adequate fluid intake (hydration): have 1 cup (8 oz) of fluid for each diarrhea episode. Avoid fluids that contain simple sugars or sports drinks, fruit juices, whole milk products, and sodas. Your urine should be clear or pale yellow if you are drinking enough fluids. Hydrate with an oral rehydration solution that you can purchase at pharmacies, retail stores, and online. You can prepare an oral rehydration solution at home by mixing the following ingredients together:    tsp table salt.   tsp baking soda.   tsp salt substitute containing potassium chloride.  1  tablespoons sugar.  1 L (34 oz) of water.  Certain foods and beverages may increase the speed at which food moves through the gastrointestinal (GI) tract. These foods and beverages should be avoided and include:  Caffeinated and alcoholic beverages.  High-fiber foods, such as raw fruits and vegetables, nuts, seeds, and whole grain breads and cereals.  Foods and beverages sweetened with sugar alcohols, such as xylitol, sorbitol, and mannitol.  Some foods may be well tolerated and may help thicken stool including:  Starchy foods, such as rice, toast, pasta, low-sugar cereal, oatmeal, grits, baked potatoes, crackers, and bagels.   Bananas.   Applesauce.  Add probiotic-rich foods to help increase healthy bacteria in the GI tract, such as yogurt and fermented milk products. RECOMMENDED FOODS AND BEVERAGES Starches Choose foods with less than 2 g of fiber per serving.  Recommended:  White,  French, and pita breads, plain rolls, buns, bagels. Plain muffins, matzo. Soda, saltine, or graham crackers. Pretzels, melba toast, zwieback. Cooked cereals made with water: cornmeal, farina, cream cereals. Dry cereals: refined corn, wheat, rice. Potatoes prepared any way without skins, refined macaroni, spaghetti, noodles, refined rice.  Avoid:  Bread, rolls, or crackers made with whole wheat, multi-grains, rye, bran seeds, nuts, or coconut. Corn tortillas or taco shells. Cereals containing whole grains, multi-grains, bran, coconut, nuts, raisins. Cooked or dry oatmeal. Coarse wheat cereals, granola. Cereals advertised as "high-fiber." Potato skins. Whole grain pasta, wild or brown rice. Popcorn. Sweet potatoes, yams. Sweet rolls, doughnuts, waffles, pancakes, sweet breads. Vegetables  Recommended: Strained tomato and vegetable juices. Most well-cooked and canned vegetables without seeds. Fresh: Tender lettuce, cucumber without the skin, cabbage, spinach, bean sprouts.  Avoid: Fresh, cooked, or canned: Artichokes, baked beans, beet greens, broccoli, Brussels sprouts, corn, kale, legumes, peas, sweet potatoes. Cooked: Green or red cabbage, spinach. Avoid large servings of any vegetables because vegetables shrink when cooked, and they contain more fiber per serving than fresh vegetables. Fruit  Recommended: Cooked or canned: Apricots, applesauce, cantaloupe, cherries, fruit cocktail, grapefruit, grapes, kiwi, mandarin oranges, peaches, pears, plums, watermelon. Fresh: Apples without skin, ripe banana, grapes, cantaloupe, cherries, grapefruit, peaches, oranges, plums. Keep servings limited to  cup or 1 piece.  Avoid: Fresh: Apples with skin, apricots, mangoes, pears, raspberries, strawberries. Prune juice, stewed or dried prunes. Dried fruits, raisins, dates. Large servings of all fresh fruits. Protein  Recommended: Ground or well-cooked tender beef, ham, veal, lamb, pork, or poultry. Eggs. Fish,  oysters, shrimp,   lobster, other seafoods. Liver, organ meats.  Avoid: Tough, fibrous meats with gristle. Peanut butter, smooth or chunky. Cheese, nuts, seeds, legumes, dried peas, beans, lentils. Dairy  Recommended: Yogurt, lactose-free milk, kefir, drinkable yogurt, buttermilk, soy milk, or plain hard cheese.  Avoid: Milk, chocolate milk, beverages made with milk, such as milkshakes. Soups  Recommended: Bouillon, broth, or soups made from allowed foods. Any strained soup.  Avoid: Soups made from vegetables that are not allowed, cream or milk-based soups. Desserts and Sweets  Recommended: Sugar-free gelatin, sugar-free frozen ice pops made without sugar alcohol.  Avoid: Plain cakes and cookies, pie made with fruit, pudding, custard, cream pie. Gelatin, fruit, ice, sherbet, frozen ice pops. Ice cream, ice milk without nuts. Plain hard candy, honey, jelly, molasses, syrup, sugar, chocolate syrup, gumdrops, marshmallows. Fats and Oils  Recommended: Limit fats to less than 8 tsp per day.  Avoid: Seeds, nuts, olives, avocados. Margarine, butter, cream, mayonnaise, salad oils, plain salad dressings. Plain gravy, crisp bacon without rind. Beverages  Recommended: Water, decaffeinated teas, oral rehydration solutions, sugar-free beverages not sweetened with sugar alcohols.  Avoid: Fruit juices, caffeinated beverages (coffee, tea, soda), alcohol, sports drinks, or lemon-lime soda. Condiments  Recommended: Ketchup, mustard, horseradish, vinegar, cocoa powder. Spices in moderation: allspice, basil, bay leaves, celery powder or leaves, cinnamon, cumin powder, curry powder, ginger, mace, marjoram, onion or garlic powder, oregano, paprika, parsley flakes, ground pepper, rosemary, sage, savory, tarragon, thyme, turmeric.  Avoid: Coconut, honey. Document Released: 07/02/2003 Document Revised: 01/04/2012 Document Reviewed: 08/26/2011 Exit[employer] Patient Information 2014 [employer].   Diarrhea Diarrhea is frequent loose and watery bowel movements. It can cause you to feel weak and dehydrated. Dehydration can cause you to become tired and thirsty, have a dry mouth, and have decreased urination that often is dark yellow. Diarrhea is a sign of another problem, most often an infection that will not last long. In most cases, diarrhea typically lasts 2 3 days. However, it can last longer if it is a sign of something more serious. It is important to treat your diarrhea as directed by your caregive to lessen or prevent future episodes of diarrhea. CAUSES  Some common causes include:  Gastrointestinal infections caused by viruses, bacteria, or parasites.  Food poisoning or food allergies.  Certain medicines, such as antibiotics, chemotherapy, and laxatives.  Artificial sweeteners and fructose.  Digestive disorders. HOME CARE INSTRUCTIONS  Ensure adequate fluid intake (hydration): have 1 cup (8 oz) of fluid for each diarrhea episode. Avoid fluids that contain simple sugars or sports drinks, fruit juices, whole milk products, and sodas. Your urine should be clear or pale yellow if you are drinking enough fluids. Hydrate with an oral rehydration solution that you can purchase at pharmacies, retail stores, and online. You can prepare an oral rehydration solution at home by mixing the following ingredients together:    tsp table salt.   tsp baking soda.   tsp salt substitute containing potassium chloride.  1  tablespoons sugar.  1 L (34 oz) of water.  Certain foods and beverages may increase the speed at which food moves through the gastrointestinal (GI) tract. These foods and beverages should be avoided and include:  Caffeinated and alcoholic beverages.  High-fiber foods, such as raw fruits and vegetables, nuts, seeds, and whole grain breads and cereals.  Foods and beverages sweetened with sugar alcohols, such as xylitol, sorbitol, and mannitol.  Some foods may  be well tolerated and may help thicken stool including:  Starchy foods, such as rice,   toast, pasta, low-sugar cereal, oatmeal, grits, baked potatoes, crackers, and bagels.  Bananas.  Applesauce.  Add probiotic-rich foods to help increase healthy bacteria in the GI tract, such as yogurt and fermented milk products.  Wash your hands well after each diarrhea episode.  Only take over-the-counter or prescription medicines as directed by your caregiver.  Take a warm bath to relieve any burning or pain from frequent diarrhea episodes. SEEK IMMEDIATE MEDICAL CARE IF:   You are unable to keep fluids down.  You have persistent vomiting.  You have blood in your stool, or your stools are black and tarry.  You do not urinate in 6 8 hours, or there is only a small amount of very dark urine.  You have abdominal pain that increases or localizes.  You have weakness, dizziness, confusion, or lightheadedness.  You have a severe headache.  Your diarrhea gets worse or does not get better.  You have a fever or persistent symptoms for more than 2 3 days.  You have a fever and your symptoms suddenly get worse. MAKE SURE YOU:   Understand these instructions.  Will watch your condition.  Will get help right away if you are not doing well or get worse. Document Released: 04/01/2002 Document Revised: 03/28/2012 Document Reviewed: 12/18/2011 ExitCare Patient Information 2014 [employer].  

## 2013-06-16 NOTE — MAU Note (Signed)
Patient presents with complaint of diarrhea since yesterday.

## 2013-06-16 NOTE — MAU Provider Note (Signed)
History     CSN: [id number]  Arrival date and time: 06/16/13 1736   First Provider Initiated Contact with Patient 06/16/13 1822      Chief Complaint  Patient presents with  . Diarrhea   HPI This is a 28 y.o. female at [redacted]w[redacted]d who is scheduled for a C/S Tuesday who presents with c/o diarrhea since yesterday. Has had about 8 BMs today.  No fever or N/V.  SOme abdominal cramping with diarrhea episodes. No bleeding. Able to keep down food and fluids. Did eat a lot of grapes this morning and yesterday.   RN Note:  Patient presents with complaint of diarrhea since yesterday.       OB History   Grav Para Term Preterm Abortions TAB SAB Ect Mult Living   3 2 2       2       Past Medical History  Diagnosis Date  . Hypertension   . Kidney stones     Past Surgical History  Procedure Laterality Date  . Cesarean section      Family History  Problem Relation Age of Onset  . Diabetes Maternal Grandfather   . Hypertension Maternal Grandfather     History  Substance Use Topics  . Smoking status: Current Every Day Smoker -- 0.25 packs/day for 14 years    Types: Cigarettes  . Smokeless tobacco: Former Neurosurgeon  . Alcohol Use: No    Allergies: No Known Allergies  Prescriptions prior to admission  Medication Sig Dispense Refill  . butalbital-acetaminophen-caffeine (FIORICET, ESGIC) 50-325-40 MG per tablet Take 1 tablet by mouth as needed for headache.      Marland Kitchen NIFEdipine (PROCARDIA) 10 MG capsule Take 10 mg by mouth every 6 (six) hours.      . Prenatal Vit-Fe Fumarate-FA (PRENATAL MULTIVITAMIN) TABS tablet Take 1 tablet by mouth daily at 12 noon.      Marland Kitchen acetaminophen-codeine (TYLENOL #3) 300-30 MG per tablet Take 1 tablet by mouth every 4 (four) hours as needed for moderate pain.        Review of Systems  Constitutional: Negative for fever, chills and malaise/fatigue.  Gastrointestinal: Positive for abdominal pain (occasional cramps with diarrhea episodes) and diarrhea. Negative  for nausea, vomiting and constipation.  Genitourinary: Negative for dysuria.  Neurological: Negative for dizziness and headaches.   Physical Exam   Blood pressure 112/73, pulse 89, temperature 97.8 F (36.6 C), temperature source Oral, resp. rate 18, height 5' (1.524 m), weight 66.849 kg (147 lb 6 oz), last menstrual period 10/06/2012.  Physical Exam  Constitutional: She is oriented to person, place, and time. She appears well-developed. No distress.  HENT:  Head: Normocephalic.  Cardiovascular: Normal rate and regular rhythm.  Exam reveals no gallop and no friction rub.   No murmur heard. Respiratory: Effort normal. No respiratory distress.  GI: Soft. She exhibits no distension and no mass. There is tenderness (mild diffuse). There is no rebound and no guarding.  Genitourinary:  Deferred secondary to previa   Musculoskeletal: Normal range of motion.  Neurological: She is alert and oriented to person, place, and time.  Skin: Skin is warm and dry.  Psychiatric: She has a normal mood and affect.   FHR reactive Category I Rare contractions  MAU Course  Procedures  Assessment and Plan  A:   SIUP at [redacted]w[redacted]d        Known placenta previa       Diarrhea  P:  Discussed with Dr Clearance Coots  May use Immodium at home  Rx Lomotil       Diarrhea diet       Keep preoperative appt  [facility] HealthWILLIAMS,Anais Denslow 06/16/2013, 6:46 PM

## 2013-06-17 ENCOUNTER — Inpatient Hospital Stay (HOSPITAL_COMMUNITY)
Admission: AD | Admit: 2013-06-17 | Discharge: 2013-06-21 | DRG: 766 | Disposition: A | Discharge status: Home / Self Care | Payer: Medicaid Other | Source: Ambulatory Visit | Attending: Obstetrics | Admitting: Obstetrics

## 2013-06-17 ENCOUNTER — Encounter (HOSPITAL_COMMUNITY): Payer: Self-pay | Admitting: *Deleted

## 2013-06-17 ENCOUNTER — Other Ambulatory Visit (HOSPITAL_COMMUNITY): Payer: Medicaid Other

## 2013-06-17 DIAGNOSIS — O0940 Supervision of pregnancy with grand multiparity, unspecified trimester: Secondary | ICD-10-CM

## 2013-06-17 DIAGNOSIS — Z98891 History of uterine scar from previous surgery: Secondary | ICD-10-CM

## 2013-06-17 DIAGNOSIS — O4410 Placenta previa with hemorrhage, unspecified trimester: Principal | ICD-10-CM | POA: Diagnosis present

## 2013-06-17 DIAGNOSIS — O4403 Placenta previa specified as without hemorrhage, third trimester: Secondary | ICD-10-CM | POA: Diagnosis present

## 2013-06-17 DIAGNOSIS — O99334 Smoking (tobacco) complicating childbirth: Secondary | ICD-10-CM | POA: Diagnosis present

## 2013-06-17 DIAGNOSIS — Z302 Encounter for sterilization: Secondary | ICD-10-CM

## 2013-06-17 DIAGNOSIS — O34219 Maternal care for unspecified type scar from previous cesarean delivery: Secondary | ICD-10-CM | POA: Diagnosis present

## 2013-06-17 LAB — CBC
HEMATOCRIT: 32.4 % — AB (ref 36.0–46.0)
Hemoglobin: 11.3 g/dL — ABNORMAL LOW (ref 12.0–15.0)
MCH: 32.9 pg (ref 26.0–34.0)
MCHC: 34.9 g/dL (ref 30.0–36.0)
MCV: 94.5 fL (ref 78.0–100.0)
Platelets: 235 10*3/uL (ref 150–400)
RBC: 3.43 MIL/uL — AB (ref 3.87–5.11)
RDW: 12.9 % (ref 11.5–15.5)
WBC: 10.6 10*3/uL — ABNORMAL HIGH (ref 4.0–10.5)

## 2013-06-17 MED ORDER — DOCUSATE SODIUM 100 MG PO CAPS
100.0000 mg | ORAL_CAPSULE | Freq: Every day | ORAL | Status: DC
  Administered 2013-06-17: 100 mg via ORAL
  Filled 2013-06-17: qty 1

## 2013-06-17 MED ORDER — BUTALBITAL-APAP-CAFFEINE 50-325-40 MG PO TABS: 1.0000 | ORAL_TABLET | ORAL | Status: DC | PRN

## 2013-06-17 MED ORDER — ACETAMINOPHEN 325 MG PO TABS: 650.0000 mg | ORAL_TABLET | ORAL | Status: DC | PRN

## 2013-06-17 MED ORDER — ZOLPIDEM TARTRATE 5 MG PO TABS
5.0000 mg | ORAL_TABLET | Freq: Every evening | ORAL | Status: DC | PRN
  Administered 2013-06-17: 5 mg via ORAL
  Filled 2013-06-17: qty 1

## 2013-06-17 MED ORDER — DEXTROSE-NACL 5-0.45 % IV SOLN
INTRAVENOUS | Status: DC
  Administered 2013-06-17 (×3): via INTRAVENOUS

## 2013-06-17 MED ORDER — CALCIUM CARBONATE ANTACID 500 MG PO CHEW: 2.0000 | CHEWABLE_TABLET | ORAL | Status: DC | PRN

## 2013-06-17 MED ORDER — NIFEDIPINE 10 MG PO CAPS
10.0000 mg | ORAL_CAPSULE | ORAL | Status: AC
  Administered 2013-06-17 (×3): 10 mg via ORAL
  Filled 2013-06-17 (×3): qty 1

## 2013-06-17 MED ORDER — PRENATAL MULTIVITAMIN CH
1.0000 | ORAL_TABLET | Freq: Every day | ORAL | Status: DC
  Administered 2013-06-17: 1 via ORAL
  Filled 2013-06-17: qty 1

## 2013-06-17 MED ORDER — LACTATED RINGERS IV BOLUS (SEPSIS)
1000.0000 mL | Freq: Once | INTRAVENOUS | Status: AC
  Administered 2013-06-17: 1000 mL via INTRAVENOUS

## 2013-06-17 MED ORDER — NIFEDIPINE 10 MG PO CAPS
10.0000 mg | ORAL_CAPSULE | Freq: Four times a day (QID) | ORAL | Status: DC
  Administered 2013-06-17 (×4): 10 mg via ORAL
  Filled 2013-06-17 (×4): qty 1

## 2013-06-17 NOTE — Progress Notes (Signed)
Ur chart review completed.  

## 2013-06-17 NOTE — MAU Note (Signed)
Bright red vaginal bleeding and passing clots that started around 1230 am, Hx of previa scheduled for C-Section  02/24

## 2013-06-17 NOTE — Plan of Care (Signed)
Problem: Consults Goal: Birthing Suites Patient Information Press F2 to bring up selections list  Outcome: Completed/Met Date Met:  06/17/13  Pt < [redacted] weeks EGA

## 2013-06-17 NOTE — H&P (Signed)
[name] Hendrix is a 28 y.o. female presenting for vaginal bleeding and diarrhea. Maternal Medical History:  Reason for admission: Contractions and vaginal bleeding.  28 yo G3 P2.  H/O placenta previa.  For C/S on 06-18-13.  Presents with diarrhea and an episode of vaginal bleeding at home.  Presented by EMS.  No further active bleeding noted.  She is on Procardia for PTL and is having some uterine irritability.  Fetal activity: Perceived fetal activity is normal.   Last perceived fetal movement was within the past hour.    Prenatal complications: Placental abnormality and preterm labor.   Prenatal Complications - Diabetes: none.    OB History   Grav Para Term Preterm Abortions TAB SAB Ect Mult Living   3 2 2       2      Past Medical History  Diagnosis Date  . Hypertension   . Kidney stones    Past Surgical History  Procedure Laterality Date  . Cesarean section     Family History: family history includes Diabetes in her maternal grandfather; Hypertension in her maternal grandfather. Social History:  reports that she has been smoking Cigarettes.  She has a 3.5 pack-year smoking history. She has quit using smokeless tobacco. She reports that she does not drink alcohol or use illicit drugs.   Prenatal Transfer Tool  Maternal Diabetes: No Genetic Screening: Normal Maternal Ultrasounds/Referrals: Abnormal:  Findings:   Other:  Placenta Previa Fetal Ultrasounds or other Referrals:  Referred to Materal Fetal Medicine , Other:   Maternal Substance Abuse:  No Significant Maternal Medications:  Meds include: Other:  Procardia Significant Maternal Lab Results:  Lab values include: Other:  HGB = 11 gm Other Comments:  None  Review of Systems  All other systems reviewed and are negative.      Blood pressure 108/66, pulse 86, temperature 98.2 F (36.8 C), temperature source Oral, resp. rate 18, height 5' (1.524 m), weight 147 lb (66.679 kg), last menstrual period  10/06/2012. Maternal Exam:  Uterine Assessment: Contraction strength is mild.  Contraction frequency is irregular.   Cervix: not evaluated.   Physical Exam  Nursing note and vitals reviewed. Constitutional: She appears well-developed and well-nourished.  Cardiovascular: Normal rate and regular rhythm.   Respiratory: Effort normal.  GI: Soft.    Prenatal labs: ABO, Rh: --/--/O POS (02/23 0110) Antibody: NEG (02/23 0110) Rubella:   RPR: NON REACTIVE (03/14 1500)  HBsAg:    HIV:    GBS:     Assessment/Plan: 36.2 weeks.  Placenta previa.  Diarrhea and episode of vaginal bleeding.  Stable.  Admit.  NPO.  Type and Screen for blood.  OR notified.   [name] Hendrix A 06/17/2013, 3:00 AM

## 2013-06-17 NOTE — Progress Notes (Signed)
Up to shower

## 2013-06-17 NOTE — MAU Provider Note (Signed)
History     CSN: [id number]  Arrival date and time: 06/17/13 0105   First Provider Initiated Contact with Patient 06/17/13 0112      Chief Complaint  Patient presents with  . Vaginal Bleeding   Vaginal Bleeding    [name] Hendrix is a 28 y.o. G3P2002 at [redacted]w[redacted]d who presents today via EMS with vaginal bleeding. She has a known previa, and was seen earlier for diarrhea. She states that she went home and had one more episode of diarrhea, and then felt better. She then went to the bathroom at 0030 and had an episode of bleeding. She states that she felt it gushing out of her and she passed a few small clots. She states that she does feel contractions. She has not been able to time them. She last ate and drank at 2230. She is scheduled for a c-section on 06/18/13. She had one c-section in 2008 and a VBAC in 2011.   Past Medical History  Diagnosis Date  . Hypertension   . Kidney stones     Past Surgical History  Procedure Laterality Date  . Cesarean section      Family History  Problem Relation Age of Onset  . Diabetes Maternal Grandfather   . Hypertension Maternal Grandfather     History  Substance Use Topics  . Smoking status: Current Every Day Smoker -- 0.25 packs/day for 14 years    Types: Cigarettes  . Smokeless tobacco: Former Neurosurgeon  . Alcohol Use: No    Allergies: No Known Allergies  Prescriptions prior to admission  Medication Sig Dispense Refill  . acetaminophen-codeine (TYLENOL #3) 300-30 MG per tablet Take 1 tablet by mouth every 4 (four) hours as needed for moderate pain.      . butalbital-acetaminophen-caffeine (FIORICET, ESGIC) 50-325-40 MG per tablet Take 1 tablet by mouth as needed for headache.      . diphenoxylate-atropine (LOMOTIL) 2.5-0.025 MG per tablet Take 1 tablet by mouth 4 (four) times daily as needed for diarrhea or loose stools.  5 tablet  0  . NIFEdipine (PROCARDIA) 10 MG capsule Take 10 mg by mouth every 6 (six) hours.      . Prenatal Vit-Fe  Fumarate-FA (PRENATAL MULTIVITAMIN) TABS tablet Take 1 tablet by mouth daily at 12 noon.        Review of Systems  Genitourinary: Positive for vaginal bleeding.   Physical Exam   Last menstrual period 10/06/2012.  Physical Exam  Nursing note and vitals reviewed. Constitutional: She is oriented to person, place, and time. She appears well-developed and well-nourished. No distress.  Cardiovascular: Normal rate.   Respiratory: Effort normal.  GI: Soft. There is tenderness.  Genitourinary:   2 inch area of blood on the pad she was wearing when she came in.   Neurological: She is alert and oriented to person, place, and time.  Skin: Skin is warm and dry.  Psychiatric: She has a normal mood and affect.    MAU Course  Procedures  0125: D/W Dr. Clearance Coots, will start IV and give procardia at this time. Continue to monitor bleeding. Depending on how the bleeding progresses from there we will decide where to send her (ante v. L&D). 0150: D/W Dr. Clearance Coots, NST is reassuring, but not reactive at this time. No current bleeding at this time, but the uterus is tender.  0247: D/W Dr. Clearance Coots. Will admit to ante at this time. Will continue to monitor.   Results for orders placed during the hospital encounter of 06/17/13 (  from the past 24 hour(s))  CBC     Status: Abnormal   Collection Time    06/17/13  1:10 AM      Result Value Ref Range   WBC 10.6 (*) 4.0 - 10.5 K/uL   RBC 3.43 (*) 3.87 - 5.11 MIL/uL   Hemoglobin 11.3 (*) 12.0 - 15.0 g/dL   HCT 91.432.4 (*) 78.236.0 - 95.646.0 %   MCV 94.5  78.0 - 100.0 fL   MCH 32.9  26.0 - 34.0 pg   MCHC 34.9  30.0 - 36.0 g/dL   RDW 21.312.9  08.611.5 - 57.815.5 %   Platelets 235  150 - 400 K/uL  TYPE AND SCREEN     Status: None   Collection Time    06/17/13  1:10 AM      Result Value Ref Range   ABO/RH(D) O POS     Antibody Screen PENDING     Sample Expiration 06/20/2013       Assessment and Plan  Previa with bleeding at 714w2d  Admit to antenatal    [name] Hendrix, [name] Schifano  Hendrix 06/17/2013, 1:15 AM

## 2013-06-17 NOTE — Progress Notes (Signed)
Patient ID: [name] CongoLanika Galka, female   DOB: 1985/06/10, 28 y.o.   MRN: [id number] Patient was admitted last night she started bleeding  Around midnight She's not bleeding at the present time and she is scheduled for her repeat C-section and tubal ligation tomorrow and

## 2013-06-18 ENCOUNTER — Encounter (HOSPITAL_COMMUNITY): Payer: Medicaid Other | Admitting: Anesthesiology

## 2013-06-18 ENCOUNTER — Encounter (HOSPITAL_COMMUNITY)
Admission: AD | Disposition: A | Discharge status: Home / Self Care | Payer: Self-pay | Source: Ambulatory Visit | Attending: Obstetrics

## 2013-06-18 ENCOUNTER — Inpatient Hospital Stay (HOSPITAL_COMMUNITY): Payer: Medicaid Other | Admitting: Anesthesiology

## 2013-06-18 ENCOUNTER — Inpatient Hospital Stay (HOSPITAL_COMMUNITY): Admission: RE | Admit: 2013-06-18 | Payer: Medicaid Other | Source: Ambulatory Visit | Admitting: Obstetrics

## 2013-06-18 ENCOUNTER — Encounter (HOSPITAL_COMMUNITY): Payer: Self-pay | Admitting: *Deleted

## 2013-06-18 DIAGNOSIS — Z98891 History of uterine scar from previous surgery: Secondary | ICD-10-CM

## 2013-06-18 LAB — RAPID HIV SCREEN (WH-MAU): Rapid HIV Screen: NONREACTIVE

## 2013-06-18 LAB — PREPARE RBC (CROSSMATCH)

## 2013-06-18 LAB — SURGICAL PCR SCREEN
MRSA, PCR: NEGATIVE
STAPHYLOCOCCUS AUREUS: POSITIVE — AB

## 2013-06-18 SURGERY — Surgical Case
Anesthesia: Spinal

## 2013-06-18 SURGERY — Surgical Case
Anesthesia: Spinal | Laterality: Bilateral

## 2013-06-18 MED ORDER — SENNOSIDES-DOCUSATE SODIUM 8.6-50 MG PO TABS
2.0000 | ORAL_TABLET | ORAL | Status: DC
  Administered 2013-06-20 (×2): 2 via ORAL
  Filled 2013-06-18 (×2): qty 2

## 2013-06-18 MED ORDER — DIPHENHYDRAMINE HCL 25 MG PO CAPS
25.0000 mg | ORAL_CAPSULE | Freq: Four times a day (QID) | ORAL | Status: DC | PRN
  Administered 2013-06-18: 25 mg via ORAL
  Filled 2013-06-18: qty 1

## 2013-06-18 MED ORDER — DIPHENHYDRAMINE HCL 50 MG/ML IJ SOLN: 12.5000 mg | INTRAMUSCULAR | Status: DC | PRN

## 2013-06-18 MED ORDER — LACTATED RINGERS IV SOLN
INTRAVENOUS | Status: DC
  Administered 2013-06-18: 18:00:00 via INTRAVENOUS

## 2013-06-18 MED ORDER — ONDANSETRON HCL 4 MG/2ML IJ SOLN: 4.0000 mg | Freq: Three times a day (TID) | INTRAMUSCULAR | Status: DC | PRN

## 2013-06-18 MED ORDER — NALBUPHINE HCL 10 MG/ML IJ SOLN: 5.0000 mg | INTRAMUSCULAR | Status: DC | PRN

## 2013-06-18 MED ORDER — SCOPOLAMINE 1 MG/3DAYS TD PT72
1.0000 | MEDICATED_PATCH | Freq: Once | TRANSDERMAL | Status: AC
  Administered 2013-06-18: 1.5 mg via TRANSDERMAL

## 2013-06-18 MED ORDER — ONDANSETRON HCL 4 MG/2ML IJ SOLN: 4.0000 mg | INTRAMUSCULAR | Status: DC | PRN

## 2013-06-18 MED ORDER — DIPHENHYDRAMINE HCL 50 MG/ML IJ SOLN: 25.0000 mg | INTRAMUSCULAR | Status: DC | PRN

## 2013-06-18 MED ORDER — NALBUPHINE HCL 10 MG/ML IJ SOLN
INTRAMUSCULAR | Status: AC
  Filled 2013-06-18: qty 1

## 2013-06-18 MED ORDER — IBUPROFEN 600 MG PO TABS
600.0000 mg | ORAL_TABLET | Freq: Four times a day (QID) | ORAL | Status: DC
  Administered 2013-06-19 – 2013-06-21 (×10): 600 mg via ORAL
  Filled 2013-06-18 (×10): qty 1

## 2013-06-18 MED ORDER — SODIUM CHLORIDE 0.9 % IJ SOLN: 3.0000 mL | INTRAMUSCULAR | Status: DC | PRN

## 2013-06-18 MED ORDER — TETANUS-DIPHTH-ACELL PERTUSSIS 5-2.5-18.5 LF-MCG/0.5 IM SUSP: 0.5000 mL | Freq: Once | INTRAMUSCULAR | Status: DC

## 2013-06-18 MED ORDER — MORPHINE SULFATE (PF) 0.5 MG/ML IJ SOLN
INTRAMUSCULAR | Status: DC | PRN
  Administered 2013-06-18: .15 mg via EPIDURAL

## 2013-06-18 MED ORDER — ZOLPIDEM TARTRATE 5 MG PO TABS: 5.0000 mg | ORAL_TABLET | Freq: Every evening | ORAL | Status: DC | PRN

## 2013-06-18 MED ORDER — SIMETHICONE 80 MG PO CHEW: 80.0000 mg | CHEWABLE_TABLET | ORAL | Status: DC | PRN

## 2013-06-18 MED ORDER — SCOPOLAMINE 1 MG/3DAYS TD PT72
MEDICATED_PATCH | TRANSDERMAL | Status: AC
  Administered 2013-06-18: 1.5 mg via TRANSDERMAL
  Filled 2013-06-18: qty 1

## 2013-06-18 MED ORDER — OXYTOCIN 10 UNIT/ML IJ SOLN
40.0000 [IU] | INTRAVENOUS | Status: DC | PRN
  Administered 2013-06-18: 40 [IU] via INTRAVENOUS

## 2013-06-18 MED ORDER — OXYTOCIN 40 UNITS IN LACTATED RINGERS INFUSION - SIMPLE MED: 62.5000 mL/h | INTRAVENOUS | Status: AC

## 2013-06-18 MED ORDER — LACTATED RINGERS IV SOLN
INTRAVENOUS | Status: DC | PRN
  Administered 2013-06-18: 09:00:00 via INTRAVENOUS

## 2013-06-18 MED ORDER — ONDANSETRON HCL 4 MG/2ML IJ SOLN
INTRAMUSCULAR | Status: DC | PRN
  Administered 2013-06-18: 4 mg via INTRAVENOUS

## 2013-06-18 MED ORDER — FENTANYL CITRATE 0.05 MG/ML IJ SOLN: 25.0000 ug | INTRAMUSCULAR | Status: DC | PRN

## 2013-06-18 MED ORDER — NALBUPHINE HCL 10 MG/ML IJ SOLN
5.0000 mg | INTRAMUSCULAR | Status: DC | PRN
  Administered 2013-06-18 – 2013-06-19 (×3): 10 mg via SUBCUTANEOUS
  Filled 2013-06-18 (×2): qty 1

## 2013-06-18 MED ORDER — SIMETHICONE 80 MG PO CHEW
80.0000 mg | CHEWABLE_TABLET | ORAL | Status: DC
  Administered 2013-06-20 (×2): 80 mg via ORAL
  Filled 2013-06-18 (×2): qty 1

## 2013-06-18 MED ORDER — CITRIC ACID-SODIUM CITRATE 334-500 MG/5ML PO SOLN: 30.0000 mL | Freq: Once | ORAL | Status: DC

## 2013-06-18 MED ORDER — DEXTROSE 5 % IV SOLN
1.0000 ug/kg/h | INTRAVENOUS | Status: DC | PRN
  Filled 2013-06-18: qty 2

## 2013-06-18 MED ORDER — DIPHENHYDRAMINE HCL 25 MG PO CAPS: 25.0000 mg | ORAL_CAPSULE | ORAL | Status: DC | PRN

## 2013-06-18 MED ORDER — MEPERIDINE HCL 25 MG/ML IJ SOLN: 6.2500 mg | INTRAMUSCULAR | Status: DC | PRN

## 2013-06-18 MED ORDER — KETOROLAC TROMETHAMINE 30 MG/ML IJ SOLN
INTRAMUSCULAR | Status: AC
  Administered 2013-06-18: 30 mg via INTRAMUSCULAR
  Filled 2013-06-18: qty 1

## 2013-06-18 MED ORDER — ONDANSETRON HCL 4 MG PO TABS: 4.0000 mg | ORAL_TABLET | ORAL | Status: DC | PRN

## 2013-06-18 MED ORDER — CEFAZOLIN SODIUM-DEXTROSE 2-3 GM-% IV SOLR
2.0000 g | Freq: Once | INTRAVENOUS | Status: AC
  Administered 2013-06-18: 2 g via INTRAVENOUS
  Filled 2013-06-18: qty 50

## 2013-06-18 MED ORDER — METOCLOPRAMIDE HCL 5 MG/ML IJ SOLN: 10.0000 mg | Freq: Three times a day (TID) | INTRAMUSCULAR | Status: DC | PRN

## 2013-06-18 MED ORDER — DIBUCAINE 1 % RE OINT: 1.0000 "application " | TOPICAL_OINTMENT | RECTAL | Status: DC | PRN

## 2013-06-18 MED ORDER — NALOXONE HCL 0.4 MG/ML IJ SOLN: 0.4000 mg | INTRAMUSCULAR | Status: DC | PRN

## 2013-06-18 MED ORDER — KETOROLAC TROMETHAMINE 30 MG/ML IJ SOLN
30.0000 mg | Freq: Four times a day (QID) | INTRAMUSCULAR | Status: AC | PRN
  Administered 2013-06-18: 30 mg via INTRAMUSCULAR
  Filled 2013-06-18: qty 1

## 2013-06-18 MED ORDER — WITCH HAZEL-GLYCERIN EX PADS: 1.0000 "application " | MEDICATED_PAD | CUTANEOUS | Status: DC | PRN

## 2013-06-18 MED ORDER — CITRIC ACID-SODIUM CITRATE 334-500 MG/5ML PO SOLN
ORAL | Status: AC
  Filled 2013-06-18: qty 15

## 2013-06-18 MED ORDER — KETOROLAC TROMETHAMINE 30 MG/ML IJ SOLN
30.0000 mg | Freq: Four times a day (QID) | INTRAMUSCULAR | Status: AC | PRN
  Administered 2013-06-18: 30 mg via INTRAVENOUS

## 2013-06-18 MED ORDER — LANOLIN HYDROUS EX OINT: 1.0000 "application " | TOPICAL_OINTMENT | CUTANEOUS | Status: DC | PRN

## 2013-06-18 MED ORDER — PRENATAL MULTIVITAMIN CH
1.0000 | ORAL_TABLET | Freq: Every day | ORAL | Status: DC
  Administered 2013-06-19 – 2013-06-21 (×3): 1 via ORAL
  Filled 2013-06-18 (×3): qty 1

## 2013-06-18 MED ORDER — SIMETHICONE 80 MG PO CHEW
80.0000 mg | CHEWABLE_TABLET | Freq: Three times a day (TID) | ORAL | Status: DC
  Administered 2013-06-18 – 2013-06-21 (×9): 80 mg via ORAL
  Filled 2013-06-18 (×8): qty 1

## 2013-06-18 MED ORDER — MENTHOL 3 MG MT LOZG: 1.0000 | LOZENGE | OROMUCOSAL | Status: DC | PRN

## 2013-06-18 MED ORDER — OXYCODONE-ACETAMINOPHEN 5-325 MG PO TABS
1.0000 | ORAL_TABLET | ORAL | Status: DC | PRN
  Administered 2013-06-19 (×3): 2 via ORAL
  Administered 2013-06-19 (×2): 1 via ORAL
  Administered 2013-06-20 (×5): 2 via ORAL
  Administered 2013-06-21: 1 via ORAL
  Administered 2013-06-21: 2 via ORAL
  Filled 2013-06-18 (×4): qty 2
  Filled 2013-06-18 (×3): qty 1
  Filled 2013-06-18 (×5): qty 2
  Filled 2013-06-18: qty 1

## 2013-06-18 MED ORDER — PHENYLEPHRINE 8 MG IN D5W 100 ML (0.08MG/ML) PREMIX OPTIME
INJECTION | INTRAVENOUS | Status: DC | PRN
  Administered 2013-06-18: 60 ug/min via INTRAVENOUS

## 2013-06-18 SURGICAL SUPPLY — 35 items
ADH SKN CLS APL DERMABOND .7 (GAUZE/BANDAGES/DRESSINGS) ×1
CLAMP CORD UMBIL (MISCELLANEOUS) IMPLANT
CLOTH BEACON ORANGE TIMEOUT ST (SAFETY) ×3 IMPLANT
CONTAINER PREFILL 10% NBF 15ML (MISCELLANEOUS) ×6 IMPLANT
DERMABOND ADVANCED (GAUZE/BANDAGES/DRESSINGS) ×2
DERMABOND ADVANCED .7 DNX12 (GAUZE/BANDAGES/DRESSINGS) ×1 IMPLANT
DRAPE LG THREE QUARTER DISP (DRAPES) IMPLANT
DRSG OPSITE POSTOP 4X10 (GAUZE/BANDAGES/DRESSINGS) ×3 IMPLANT
DURAPREP 26ML APPLICATOR (WOUND CARE) ×3 IMPLANT
ELECT REM PT RETURN 9FT ADLT (ELECTROSURGICAL) ×3
ELECTRODE REM PT RTRN 9FT ADLT (ELECTROSURGICAL) ×1 IMPLANT
EXTRACTOR VACUUM M CUP 4 TUBE (SUCTIONS) IMPLANT
EXTRACTOR VACUUM M CUP 4' TUBE (SUCTIONS)
GLOVE BIO SURGEON STRL SZ8.5 (GLOVE) ×3 IMPLANT
GOWN STRL REUS W/TWL 2XL LVL3 (GOWN DISPOSABLE) ×3 IMPLANT
GOWN STRL REUS W/TWL LRG LVL3 (GOWN DISPOSABLE) ×3 IMPLANT
KIT ABG SYR 3ML LUER SLIP (SYRINGE) IMPLANT
NDL HYPO 25X5/8 SAFETYGLIDE (NEEDLE) IMPLANT
NEEDLE HYPO 25X5/8 SAFETYGLIDE (NEEDLE) IMPLANT
NS IRRIG 1000ML POUR BTL (IV SOLUTION) ×3 IMPLANT
PACK C SECTION WH (CUSTOM PROCEDURE TRAY) ×3 IMPLANT
PAD OB MATERNITY 4.3X12.25 (PERSONAL CARE ITEMS) ×3 IMPLANT
SUT CHROMIC 0 CT 802H (SUTURE) ×3 IMPLANT
SUT CHROMIC 1 CTX 36 (SUTURE) ×6 IMPLANT
SUT CHROMIC 2 0 SH (SUTURE) ×3 IMPLANT
SUT GUT PLAIN 0 CT-3 TAN 27 (SUTURE) ×3 IMPLANT
SUT MON AB 4-0 PS1 27 (SUTURE) ×3 IMPLANT
SUT VIC AB 0 CT1 18XCR BRD8 (SUTURE) IMPLANT
SUT VIC AB 0 CT1 8-18 (SUTURE)
SUT VIC AB 0 CTX 36 (SUTURE) ×6
SUT VIC AB 0 CTX36XBRD ANBCTRL (SUTURE) ×2 IMPLANT
TOWEL OR 17X24 6PK STRL BLUE (TOWEL DISPOSABLE) ×3 IMPLANT
TRAY FOLEY CATH 14FR (SET/KITS/TRAYS/PACK) ×3 IMPLANT
WATER STERILE IRR 1000ML POUR (IV SOLUTION) ×3 IMPLANT
YANKAUER SUCT BULB TIP NO VENT (SUCTIONS) ×2 IMPLANT

## 2013-06-18 SURGICAL SUPPLY — 33 items
ADH SKN CLS APL DERMABOND .7 (GAUZE/BANDAGES/DRESSINGS)
CLAMP CORD UMBIL (MISCELLANEOUS) IMPLANT
CLOTH BEACON ORANGE TIMEOUT ST (SAFETY) ×1 IMPLANT
DERMABOND ADVANCED (GAUZE/BANDAGES/DRESSINGS)
DERMABOND ADVANCED .7 DNX12 (GAUZE/BANDAGES/DRESSINGS) ×1 IMPLANT
DRAPE LG THREE QUARTER DISP (DRAPES) IMPLANT
DRSG OPSITE POSTOP 4X10 (GAUZE/BANDAGES/DRESSINGS) ×1 IMPLANT
DURAPREP 26ML APPLICATOR (WOUND CARE) ×1 IMPLANT
ELECT REM PT RETURN 9FT ADLT (ELECTROSURGICAL)
ELECTRODE REM PT RTRN 9FT ADLT (ELECTROSURGICAL) ×1 IMPLANT
EXTRACTOR VACUUM M CUP 4 TUBE (SUCTIONS) IMPLANT
EXTRACTOR VACUUM M CUP 4' TUBE (SUCTIONS)
GLOVE BIO SURGEON STRL SZ8.5 (GLOVE) ×1 IMPLANT
GOWN STRL REUS W/TWL 2XL LVL3 (GOWN DISPOSABLE) ×1 IMPLANT
GOWN STRL REUS W/TWL LRG LVL3 (GOWN DISPOSABLE) ×1 IMPLANT
KIT ABG SYR 3ML LUER SLIP (SYRINGE) IMPLANT
NDL HYPO 25X5/8 SAFETYGLIDE (NEEDLE) ×1 IMPLANT
NEEDLE HYPO 25X5/8 SAFETYGLIDE (NEEDLE) IMPLANT
NS IRRIG 1000ML POUR BTL (IV SOLUTION) ×1 IMPLANT
PACK C SECTION WH (CUSTOM PROCEDURE TRAY) ×1 IMPLANT
PAD OB MATERNITY 4.3X12.25 (PERSONAL CARE ITEMS) ×1 IMPLANT
SUT CHROMIC 0 CT 802H (SUTURE) ×1 IMPLANT
SUT CHROMIC 1 CTX 36 (SUTURE) ×2 IMPLANT
SUT CHROMIC 2 0 SH (SUTURE) ×1 IMPLANT
SUT GUT PLAIN 0 CT-3 TAN 27 (SUTURE) IMPLANT
SUT MON AB 4-0 PS1 27 (SUTURE) ×1 IMPLANT
SUT VIC AB 0 CT1 18XCR BRD8 (SUTURE) IMPLANT
SUT VIC AB 0 CT1 8-18 (SUTURE)
SUT VIC AB 0 CTX 36 (SUTURE)
SUT VIC AB 0 CTX36XBRD ANBCTRL (SUTURE) ×2 IMPLANT
TOWEL OR 17X24 6PK STRL BLUE (TOWEL DISPOSABLE) ×1 IMPLANT
TRAY FOLEY CATH 14FR (SET/KITS/TRAYS/PACK) ×1 IMPLANT
WATER STERILE IRR 1000ML POUR (IV SOLUTION) ×1 IMPLANT

## 2013-06-18 NOTE — H&P (Signed)
Patient was admitted to the hospital on 223 bleeding   subsided once she was  on bedrest and she is  scheduled repeat C-section and tubal ligation today there has been no other change

## 2013-06-18 NOTE — Anesthesia Postprocedure Evaluation (Signed)
  Anesthesia Post-op Note  Patient: [name] Hendrix  Procedure(s) Performed: Procedure(s): REPEAT CESAREAN SECTION WITH BILATERAL TUBAL LIGATION (Bilateral)  Patient is awake, responsive, moving her legs, and has signs of resolution of her numbness. Pain and nausea are reasonably well controlled. Vital signs are stable and clinically acceptable. Oxygen saturation is clinically acceptable. There are no apparent anesthetic complications at this time. Patient is ready for discharge.  

## 2013-06-18 NOTE — Anesthesia Preprocedure Evaluation (Signed)
Anesthesia Evaluation  Patient identified by MRN, date of birth, ID band Patient awake    Reviewed: Allergy & Precautions, H&P , Patient's Chart, lab work & pertinent test results  Airway Mallampati: II TM Distance: >3 FB Neck ROM: full    Dental no notable dental hx.    Pulmonary former smoker,  breath sounds clear to auscultation  Pulmonary exam normal       Cardiovascular Exercise Tolerance: Good hypertension, Rhythm:regular Rate:Normal     Neuro/Psych    GI/Hepatic   Endo/Other    Renal/GU      Musculoskeletal   Abdominal   Peds  Hematology   Anesthesia Other Findings   Reproductive/Obstetrics                           Anesthesia Physical Anesthesia Plan  ASA: II  Anesthesia Plan: Spinal and Combined Spinal and Epidural   Post-op Pain Management:    Induction:   Airway Management Planned:   Additional Equipment:   Intra-op Plan:   Post-operative Plan:   Informed Consent: I have reviewed the patients History and Physical, chart, labs and discussed the procedure including the risks, benefits and alternatives for the proposed anesthesia with the patient or authorized representative who has indicated his/her understanding and acceptance.   Dental Advisory Given  Plan Discussed with: CRNA  Anesthesia Plan Comments: (Lab work confirmed with CRNA in room. Platelets okay. Discussed spinal anesthetic, and patient consents to the procedure:  included risk of possible headache,backache, failed block, allergic reaction, and nerve injury. This patient was asked if she had any questions or concerns before the procedure started. )        Anesthesia Quick Evaluation

## 2013-06-18 NOTE — Anesthesia Postprocedure Evaluation (Signed)
  Anesthesia Post-op Note  Patient: [name] Hendrix  Procedure(s) Performed: Procedure(s): REPEAT CESAREAN SECTION WITH BILATERAL TUBAL LIGATION (Bilateral)  Patient is awake, responsive, moving her legs, and has signs of resolution of her numbness. Pain and nausea are reasonably well controlled. Vital signs are stable and clinically acceptable. Oxygen saturation is clinically acceptable. There are no apparent anesthetic complications at this time. Patient is ready for discharge.

## 2013-06-18 NOTE — Transfer of Care (Signed)
Immediate Anesthesia Transfer of Care Note  Patient: [name] Hendrix  Procedure(s) Performed: Procedure(s): CESAREAN SECTION (N/A)  Patient Location: PACU  Anesthesia Type:Spinal and Epidural  Level of Consciousness: awake, alert  and oriented  Airway & Oxygen Therapy: Patient Spontanous Breathing  Post-op Assessment: Report given to PACU RN and Post -op Vital signs reviewed and stable  Post vital signs: Reviewed and stable  Complications: No apparent anesthesia complications

## 2013-06-18 NOTE — Progress Notes (Signed)
Patient ID: [name] Hendrix, female   DOB: October 15, 1985, 28 y.o.   MRN: [id number] +++

## 2013-06-18 NOTE — Op Note (Signed)
preop diagnosis previous cesarean section at 36 weeks with placenta previa and multiparity Postop diagnosis the same Procedure repeat low transverse cesarean section and tubal ligation him Surgeon Dr. Francoise CeoBernard Marvelous [place] First assistant Dr. Coral Ceoharles Harper Anesthesia spinal procedure Procedure patient placed on the operating table in the supine position abdomen prepped and draped bladder emptied with a Foley catheter a transverse suprapubic incision made carried down to the fascia fascia cleaned and incised the length of the incision the recti muscles retracted laterally peritoneum incised longitudinally transverse incision made on the visceroperitoneum above the bladder bladder mobilized inferiorly transverse low uterine incision made the fluid was clear the patient delivered from the LOA position of a female Apgar 6 and 8 and a posterior placenta previa   the placenta was removed manually and sent to pathology the uterus cleaned with dry laps the uterine incision closed in one layer with continuous   chromic hemostasis was satisfactory bladder flap reattached   the right tube was grasped in the midportion with a Babcock clamp 0 plain suture  placed in the meso  salpinx below the portion of tube   approximately  2 inches  removed the procedure done in a similar fashion on  The other  side blood loss was 1700 cc abdomen closed in layers peritoneum continuous with 2-0 chromic fascia continuous   0 Dexon and the skin closed  a subcuticular stitch of 4-0 Monocryl patient tolerated the procedure well a

## 2013-06-18 NOTE — Anesthesia Procedure Notes (Signed)
Epidural Patient location during procedure: OB  Preanesthetic Checklist Completed: patient identified, site marked, surgical consent, pre-op evaluation, timeout performed, IV checked, risks and benefits discussed and monitors and equipment checked  Epidural Patient position: sitting Prep: site prepped and draped and DuraPrep Patient monitoring: continuous pulse ox and blood pressure Approach: midline Injection technique: LOR air  Needle:  Needle type: Tuohy  Needle gauge: 17 G Needle length: 9 cm and 9 Needle insertion depth: 7 cm Catheter type: closed end flexible Catheter size: 19 Gauge Catheter at skin depth: 13 cm Test dose: negative  Assessment Events: blood not aspirated, injection not painful, no injection resistance, negative IV test and no paresthesia  Additional Notes Spinal Dosage in OR  Bupivicaine ml       1.2 PFMS04   mcg        150 Fentanyl mcg            25  Catheter (-) asp heme/CSF

## 2013-06-18 NOTE — Lactation Note (Signed)
This note was copied from the chart of [name] Hendrix. Lactation Consultation Note  Patient Name: [name] Elgie CongoLanika Cedeno WUJWJ'XToday's Date: 06/18/2013 Reason for consult: Initial assessment;Late preterm infant;Infant < 6lbs;Other (Comment) (charting for exclusion).  Mom attempted to latch this baby after delivery but baby unable to sustain latch and initial LATCH score=5.  Baby has received several feedings of 22-calorie formula due to low birth weight (4 lb 11.3 oz).  This is mom's third child.  Mom breastfed her 2 older children for 4-8 months but they were not born at the low weight of her newborn. Mom was almost asleep but states that her nurse plans to assist her to latch baby to breast at next feeding.  LC recommends mom initiate DEBP for 15 minutes every 3 hours and provide ebm as available to her baby until she is able to sustain breastfeeding without supplement.  LC encouraged frequent STS whether baby able to breastfeed or not, with a minimum of q3h feedings but more often per baby's hunger cues.   LC provided [facility] Resource brochure and reviewed [facility] services and list of community and web site resources.    Maternal Data Formula Feeding for Exclusion: Yes Reason for exclusion: Mother's choice to formula and breast feed on admission Infant to breast within first hour of birth: Yes Does the patient have breastfeeding experience prior to this delivery?: Yes  Feeding    LATCH Score/Interventions           only attempted to latch x1 with LATCH score=5           Lactation Tools Discussed/Used   STS, special needs of late preterm baby Recommend starting DEBP for 15 minutes every 3 hours  Consult Status Consult Status: Follow-up Date: 06/19/13 Follow-up type: In-patient    Warrick ParisianBryant, Naod [facility] 06/18/2013, 11:04 PM

## 2013-06-18 NOTE — Progress Notes (Signed)
Duplicate chart

## 2013-06-19 ENCOUNTER — Encounter (HOSPITAL_COMMUNITY): Payer: Self-pay | Admitting: Obstetrics

## 2013-06-19 LAB — CBC
HEMATOCRIT: 20.5 % — AB (ref 36.0–46.0)
HEMOGLOBIN: 7 g/dL — AB (ref 12.0–15.0)
MCH: 32.7 pg (ref 26.0–34.0)
MCHC: 34.1 g/dL (ref 30.0–36.0)
MCV: 95.8 fL (ref 78.0–100.0)
Platelets: 161 10*3/uL (ref 150–400)
RBC: 2.14 MIL/uL — ABNORMAL LOW (ref 3.87–5.11)
RDW: 13.1 % (ref 11.5–15.5)
WBC: 11.5 10*3/uL — ABNORMAL HIGH (ref 4.0–10.5)

## 2013-06-19 LAB — RPR: RPR Ser Ql: NONREACTIVE

## 2013-06-19 NOTE — Anesthesia Postprocedure Evaluation (Signed)
Anesthesia Post Note  Patient: [name] Hendrix  Procedure(s) Performed: Procedure(s) (LRB): REPEAT CESAREAN SECTION WITH BILATERAL TUBAL LIGATION (Bilateral)  Anesthesia type: CSE  Patient location: Mother/Baby  Post pain: Pain level controlled  Post assessment: Post-op Vital signs reviewed  Last Vitals:  Filed Vitals:   06/19/13 0600  BP: 102/67  Pulse: 88  Temp: 36.8 C  Resp: 18    Post vital signs: Reviewed  Level of consciousness: awake  Complications: No apparent anesthesia complications

## 2013-06-19 NOTE — Addendum Note (Signed)
Addendum created 06/19/13 0756 by Jhonnie GarnerBeth M Raheim Beutler, CRNA   Modules edited: Notes Section   Notes Section:  File: [id number]

## 2013-06-20 ENCOUNTER — Encounter (HOSPITAL_COMMUNITY): Payer: Self-pay | Admitting: *Deleted

## 2013-06-20 NOTE — Progress Notes (Signed)
Patient ID: [name] CongoLanika Hendrix, female   DOB: Jun 21, 1985, 28 y.o.   MRN: [id number] Postop day 2 Vital signs normal Globin 7 asymptomatic fundus firm 9 lochia moderate legs negative doing well and and

## 2013-06-20 NOTE — Progress Notes (Signed)
Pt c/o dizziness when ambulating in hall. Helped pt back to bed. Pt's BP checked. Diastolic low, but runs low for pt normally. Pt improved once laying in bed with ice chips and a cold compress.

## 2013-06-20 NOTE — Lactation Note (Signed)
This note was copied from the chart of [name] Elgie CongoLanika [place]. Lactation Consultation Note  Patient Name: [name] Hendrix ZOXWR'UToday's Date: 06/20/2013  Mother just fed her baby a bottle with formula and baby "[name]" is sleeping. Mother breast fed her other two children and this baby is much smaller than her other babies. She has questions related to breastfeeding, supplementing and pumping. She pumped last at 5:30am and breast fed in the middle of the night with RN assisted her. She has not pumped or breast fed since but is feeding per bottle her breast milk or formula. Discussed late- preterm feeding behavior and praised for all of her efforts. Mother to pump now and encouraged to pump 6-8 times in 24 hours for milk stimulation. Mother will call with next breast feeding for LC to assess the latch. Mother instructed to continue to supplement after breastfeeding until weight gain is noted. Lactation appointment will be scheduled for mother/ infant to assess feeding and milk transfer next week.   Maternal Data    Feeding Feeding Type: Bottle Fed - Formula  LATCH Score/Interventions                      Lactation Tools Discussed/Used     Consult Status      [name], Myia Bergh M 06/20/2013, 11:49 AM

## 2013-06-20 NOTE — Lactation Note (Signed)
This note was copied from the chart of [name] Hendrix. Lactation Consultation Note  Patient Name: [name] Elgie CongoLanika Karl ZOXWR'UToday's Date: 06/20/2013   Lactation follow up appointment scheduled for Wednesday, March 2 at 2:30pm for feeding evaluation.  Maternal Data    Feeding Feeding Type: Bottle Fed - Formula  LATCH Score/Interventions                      Lactation Tools Discussed/Used     Consult Status      [name], Briellah Baik M 06/20/2013, 12:06 PM

## 2013-06-21 LAB — TYPE AND SCREEN
ABO/RH(D): O POS
Antibody Screen: NEGATIVE
UNIT DIVISION: 0
Unit division: 0
Unit division: 0
Unit division: 0

## 2013-06-21 NOTE — Progress Notes (Signed)
Patient ID: [name] Hendrix, female   DOB: 1985-11-27, 28 y.o.   MRN: [id number] Postop day 3 Vital signs normal Fundus firm Incision clean and dry Legs negative Discharge today

## 2013-06-21 NOTE — Discharge Instructions (Signed)
Discharge instructions   You can wash your hair  Shower  Eat what you want  Drink what you want  See me in 6 weeks  Your ankles are going to swell more in the next 2 weeks than when pregnant  No sex for 6 weeks   Durenda Pechacek A, MD 06/21/2013

## 2013-06-21 NOTE — Discharge Summary (Signed)
Obstetric Discharge Summary Reason for Admission: cesarean section Prenatal Procedures: none Intrapartum Procedures: cesarean: low cervical, transverse Postpartum Procedures: none Complications-Operative and Postpartum: none Hemoglobin  Date Value Ref Range Status  06/19/2013 7.0* 12.0 - 15.0 g/dL Final     HCT  Date Value Ref Range Status  06/19/2013 20.5* 36.0 - 46.0 % Final    Physical Exam:  General: alert Lochia: appropriate Uterine Fundus: firm Incision: healing well DVT Evaluation: No evidence of DVT seen on physical exam.  Discharge Diagnoses: Term Pregnancy-delivered  Discharge Information: Date: 06/21/2013 Activity: pelvic rest Diet: routine Medications: Percocet Condition: stable Instructions: refer to practice specific booklet Discharge to: home Follow-up Information   Follow up with [name] Hendrix,[name] Sculley A, MD.   Specialty:  Obstetrics and Gynecology   Contact information:   [address] [place] [address] [phone]       Newborn Data: Live born female  Birth Weight: 4 lb 11.3 oz (2135 g) APGAR: 6, 8  Home with mother.  [name] Hendrix Hendrix 06/21/2013, 7:26 AM

## 2013-09-19 ENCOUNTER — Emergency Department (INDEPENDENT_AMBULATORY_CARE_PROVIDER_SITE_OTHER)
Admission: EM | Admit: 2013-09-19 | Discharge: 2013-09-19 | Disposition: A | Discharge status: Home / Self Care | Payer: Medicaid Other | Source: Home / Self Care | Attending: Family Medicine | Admitting: Family Medicine

## 2013-09-19 ENCOUNTER — Encounter (HOSPITAL_COMMUNITY): Payer: Self-pay | Admitting: Emergency Medicine

## 2013-09-19 DIAGNOSIS — K089 Disorder of teeth and supporting structures, unspecified: Secondary | ICD-10-CM

## 2013-09-19 DIAGNOSIS — K0889 Other specified disorders of teeth and supporting structures: Secondary | ICD-10-CM

## 2013-09-19 MED ORDER — IBUPROFEN 800 MG PO TABS: 800.0000 mg | ORAL_TABLET | Freq: Three times a day (TID) | ORAL | Status: DC

## 2013-09-19 MED ORDER — AMOXICILLIN 875 MG PO TABS: 875.0000 mg | ORAL_TABLET | Freq: Two times a day (BID) | ORAL | Status: DC

## 2013-09-19 MED ORDER — HYDROCODONE-ACETAMINOPHEN 5-325 MG PO TABS: 1.0000 | ORAL_TABLET | Freq: Four times a day (QID) | ORAL | Status: DC | PRN

## 2013-09-19 NOTE — ED Notes (Signed)
Pt  Reports   toothace   Which  Is  cauing headache    And earache     She reports  The  Symptoms  Started yest        The  Reports  The  Symptoms  Not  releived  By  otc  meds

## 2013-09-19 NOTE — Discharge Instructions (Signed)

## 2013-09-19 NOTE — ED Provider Notes (Signed)
CSN: [id number]     Arrival date & time 09/19/13  1506 History   None    Chief Complaint  Patient presents with  . Dental Pain   (Consider location/radiation/quality/duration/timing/severity/associated sxs/prior Treatment) HPI Comments: 28 year old female presents complaining of severe toothache. This started yesterday. Chest severe pain in her left lower jaw and at an upper tooth as well. The pain is severe, 10 out of 10. It is causing pain to radiate up into her ear. She has had this once in the past. She does not currently have a dentist. No fever, no trismus  Patient is a 28 y.o. female presenting with tooth pain.  Dental Pain   Past Medical History  Diagnosis Date  . Hypertension   . Kidney stones    Past Surgical History  Procedure Laterality Date  . Cesarean section    . Cesarean section N/A 06/18/2013    Procedure: CESAREAN SECTION;  Surgeon: [name], MD;  Location: [facility];  Service: Obstetrics;  Laterality: N/A;  . Cesarean section with bilateral tubal ligation Bilateral 06/18/2013    Procedure: REPEAT CESAREAN SECTION WITH BILATERAL TUBAL LIGATION;  Surgeon: [name], MD;  Location: [facility];  Service: Obstetrics;  Laterality: Bilateral;   Family History  Problem Relation Age of Onset  . Diabetes Maternal Grandfather   . Hypertension Maternal Grandfather    History  Substance Use Topics  . Smoking status: Former Smoker -- 0.25 packs/day for 14 years    Types: Cigarettes  . Smokeless tobacco: Former Neurosurgeon  . Alcohol Use: No   OB History   Grav Para Term Preterm Abortions TAB SAB Ect Mult Living   3 3 2 1      3      Review of Systems  HENT: Positive for dental problem and ear pain.   All other systems reviewed and are negative.   Allergies  Review of patient's allergies indicates no known allergies.  Home Medications   Prior to Admission medications   Medication Sig Start Date End Date Taking? Authorizing Provider  amoxicillin (AMOXIL)  875 MG tablet Take 1 tablet (875 mg total) by mouth 2 (two) times daily. 09/19/13   Graylon Good, PA-C  HYDROcodone-acetaminophen (NORCO) 5-325 MG per tablet Take 1 tablet by mouth every 6 (six) hours as needed for moderate pain. 09/19/13   Graylon Good, PA-C  ibuprofen (ADVIL,MOTRIN) 800 MG tablet Take 1 tablet (800 mg total) by mouth 3 (three) times daily. 09/19/13   Graylon Good, PA-C   BP 110/69  Pulse 99  Temp(Src) 98.2 F (36.8 C) (Oral)  Resp 16  SpO2 98%  LMP 09/06/2013 Physical Exam  Nursing note and vitals reviewed. Constitutional: She is oriented to person, place, and time. Vital signs are normal. She appears well-developed and well-nourished. No distress.  HENT:  Head: Normocephalic and atraumatic.  Mouth/Throat: Abnormal dentition (left posterior inferior wisdom tooth has broken through the gum and is at a a 90 angle to the gumline. The left superior canine is extremely TTP without obvious infection or abscess). No dental abscesses or dental caries.  Pulmonary/Chest: Effort normal. No respiratory distress.  Neurological: She is alert and oriented to person, place, and time. She has normal strength. Coordination normal.  Skin: Skin is warm and dry. No rash noted. She is not diaphoretic.  Psychiatric: She has a normal mood and affect. Judgment normal.    ED Course  Dental Date/Time: 09/19/2013 3:51 PM Performed by: Autumn Messing, H Authorized by:  Autumn MessingBAKER, Cicily Bonano, H Consent: Verbal consent obtained. Risks and benefits: risks, benefits and alternatives were discussed Consent given by: patient Patient identity confirmed: verbally with patient Local anesthesia used: yes Anesthesia: local infiltration Local anesthetic: bupivacaine 0.5% without epinephrine and topical anesthetic Anesthetic total: 1.8 ml Patient tolerance: Patient tolerated the procedure well with no immediate complications.  Dental Date/Time: 09/19/2013 3:52 PM Performed by: Autumn MessingBAKER, Channon Brougher,  H Authorized by: Autumn MessingBAKER, Elley Harp, H Consent: Verbal consent obtained. Risks and benefits: risks, benefits and alternatives were discussed Consent given by: patient Patient identity confirmed: verbally with patient Time out: Immediately prior to procedure a "time out" was called to verify the correct patient, procedure, equipment, support staff and site/side marked as required. Local anesthesia used: yes Local anesthetic: bupivacaine 0.5% without epinephrine and topical anesthetic Anesthetic total: 1.8 ml Patient sedated: no Patient tolerance: Patient tolerated the procedure well with no immediate complications.   (including critical care time) Labs Review Labs Reviewed - No data to display  Imaging Review No results found.   MDM   1. Toothache    Dental blocks performed for inferior alveolar and at the base of the canine with good result, pain relieved afterwards. Will discharge with ibuprofen, Norco, amoxicillin. Given a list of dentists to follow up with, she needs to be seen and probably needs to have the tooth extracted.   Meds ordered this encounter  Medications  . ibuprofen (ADVIL,MOTRIN) 800 MG tablet    Sig: Take 1 tablet (800 mg total) by mouth 3 (three) times daily.    Dispense:  30 tablet    Refill:  0    Order Specific Question:  Supervising Provider    Answer:  Linna HoffKINDL, JAMES D [phone][[id number]]  . HYDROcodone-acetaminophen (NORCO) 5-325 MG per tablet    Sig: Take 1 tablet by mouth every 6 (six) hours as needed for moderate pain.    Dispense:  15 tablet    Refill:  0    Order Specific Question:  Supervising Provider    Answer:  Linna HoffKINDL, JAMES D [phone][[id number]]  . amoxicillin (AMOXIL) 875 MG tablet    Sig: Take 1 tablet (875 mg total) by mouth 2 (two) times daily.    Dispense:  28 tablet    Refill:  0    Order Specific Question:  Supervising Provider    Answer:  Bradd CanaryKINDL, JAMES D [[id number]]       Graylon GoodZachary H Kennidi Yoshida, PA-C 09/19/13 1600

## 2013-09-19 NOTE — ED Provider Notes (Signed)
Medical screening examination/treatment/procedure(s) were performed by non-physician practitioner and as supervising physician I was immediately available for consultation/collaboration.  Leslee Home, M.D.  Reuben Likes, MD 09/19/13 2107

## 2013-12-25 ENCOUNTER — Emergency Department (HOSPITAL_COMMUNITY)
Admission: EM | Admit: 2013-12-25 | Discharge: 2013-12-25 | Disposition: A | Discharge status: Home / Self Care | Payer: Medicaid Other | Attending: Emergency Medicine | Admitting: Emergency Medicine

## 2013-12-25 ENCOUNTER — Encounter (HOSPITAL_COMMUNITY): Payer: Self-pay | Admitting: Emergency Medicine

## 2013-12-25 DIAGNOSIS — I10 Essential (primary) hypertension: Secondary | ICD-10-CM | POA: Insufficient documentation

## 2013-12-25 DIAGNOSIS — Z87442 Personal history of urinary calculi: Secondary | ICD-10-CM | POA: Insufficient documentation

## 2013-12-25 DIAGNOSIS — Z87891 Personal history of nicotine dependence: Secondary | ICD-10-CM | POA: Insufficient documentation

## 2013-12-25 DIAGNOSIS — K0381 Cracked tooth: Secondary | ICD-10-CM | POA: Diagnosis not present

## 2013-12-25 DIAGNOSIS — K089 Disorder of teeth and supporting structures, unspecified: Secondary | ICD-10-CM | POA: Insufficient documentation

## 2013-12-25 DIAGNOSIS — Z87898 Personal history of other specified conditions: Secondary | ICD-10-CM | POA: Insufficient documentation

## 2013-12-25 DIAGNOSIS — Z8768 Personal history of other (corrected) conditions arising in the perinatal period: Secondary | ICD-10-CM | POA: Insufficient documentation

## 2013-12-25 DIAGNOSIS — K0889 Other specified disorders of teeth and supporting structures: Secondary | ICD-10-CM

## 2013-12-25 HISTORY — DX: Complete placenta previa nos or without hemorrhage, unspecified trimester: O44.00

## 2013-12-25 MED ORDER — OXYCODONE-ACETAMINOPHEN 5-325 MG PO TABS
1.0000 | ORAL_TABLET | Freq: Once | ORAL | Status: AC
  Administered 2013-12-25: 1 via ORAL
  Filled 2013-12-25: qty 1

## 2013-12-25 MED ORDER — IBUPROFEN 400 MG PO TABS
600.0000 mg | ORAL_TABLET | Freq: Once | ORAL | Status: AC
  Administered 2013-12-25: 400 mg via ORAL
  Filled 2013-12-25 (×2): qty 1

## 2013-12-25 MED ORDER — PENICILLIN V POTASSIUM 250 MG PO TABS: 250.0000 mg | ORAL_TABLET | Freq: Four times a day (QID) | ORAL | Status: AC

## 2013-12-25 MED ORDER — OXYCODONE-ACETAMINOPHEN 5-325 MG PO TABS: 1.0000 | ORAL_TABLET | ORAL | Status: DC | PRN

## 2013-12-25 NOTE — ED Notes (Signed)
Pt presents with Left lower dental pain x4 days, pt states she is taking Aleve with no relief. Pt also reports pain radiating up into left side of her face and Left lateral neck pain. Pt states it hurts to turn her head

## 2013-12-25 NOTE — Discharge Instructions (Signed)
Dental Pain °A tooth ache may be caused by cavities (tooth decay). Cavities expose the nerve of the tooth to air and hot or cold temperatures. It may come from an infection or abscess (also called a boil or furuncle) around your tooth. It is also often caused by dental caries (tooth decay). This causes the pain you are having. °DIAGNOSIS  °Your caregiver can diagnose this problem by exam. °TREATMENT  °· If caused by an infection, it may be treated with medications which kill germs (antibiotics) and pain medications as prescribed by your caregiver. Take medications as directed. °· Only take over-the-counter or prescription medicines for pain, discomfort, or fever as directed by your caregiver. °· Whether the tooth ache today is caused by infection or dental disease, you should see your dentist as soon as possible for further care. °SEEK MEDICAL CARE IF: °The exam and treatment you received today has been provided on an emergency basis only. This is not a substitute for complete medical or dental care. If your problem worsens or new problems (symptoms) appear, and you are unable to meet with your dentist, call or return to this location. °SEEK IMMEDIATE MEDICAL CARE IF:  °· You have a fever. °· You develop redness and swelling of your face, jaw, or neck. °· You are unable to open your mouth. °· You have severe pain uncontrolled by pain medicine. °MAKE SURE YOU:  °· Understand these instructions. °· Will watch your condition. °· Will get help right away if you are not doing well or get worse. °Document Released: 04/11/2005 Document Revised: 07/04/2011 Document Reviewed: 11/28/2007 °ExitCare® Patient Information ©2015 [employer]. This information is not intended to replace advice given to you by your health care provider. Make sure you discuss any questions you have with your health care provider. ° °Emergency Department Resource Guide °1) Find a Doctor and Pay Out of Pocket °Although you won't have to find out who  is covered by your insurance plan, it is a good idea to ask around and get recommendations. You will then need to call the office and see if the doctor you have chosen will accept you as a new patient and what types of options they offer for patients who are self-pay. Some doctors offer discounts or will set up payment plans for their patients who do not have insurance, but you will need to ask so you aren't surprised when you get to your appointment. ° °2) Contact Your Local Health Department °Not all health departments have doctors that can see patients for sick visits, but many do, so it is worth a call to see if yours does. If you don't know where your local health department is, you can check in your phone book. The CDC also has a tool to help you locate your state's health department, and many state websites also have listings of all of their local health departments. ° °3) Find a Walk-in Clinic °If your illness is not likely to be very severe or complicated, you may want to try a walk in clinic. These are popping up all over the country in pharmacies, drugstores, and shopping centers. They're usually staffed by nurse practitioners or physician assistants that have been trained to treat common illnesses and complaints. They're usually fairly quick and inexpensive. However, if you have serious medical issues or chronic medical problems, these are probably not your best option. ° °No Primary Care Doctor: °- Call [facility] at  832-8000 - they can help you locate a primary   care doctor that  accepts your insurance, provides certain services, etc. °- Physician Referral Service- 1-800-533-3463 ° °Chronic Pain Problems: °Organization         Address  Phone   Notes  °[facility]  (336) 297-2271 Patients need to be referred by their primary care doctor.  ° °Medication Assistance: °Organization         Address  Phone   Notes  °[place] [facility] [address]  [address] °[place], [place] [address] (336) 641-8030 --Must be a resident of [place] °-- Must have NO insurance coverage whatsoever (no Medicaid/ Medicare, etc.) °-- The pt. MUST have a primary care doctor that directs their care regularly and follows them in the community °  °[facility]  (866) 331-1348   °[employer]  (888) 892-1162   ° °Agencies that provide inexpensive medical care: °Organization         Address  Phone   Notes  °[facility]  (336) 832-8035   °[facility]    (336) 832-7272   °[facility] [address] °[place], [place] [address] (336) 832-4777   °[facility] [address], °[place] (336) 271-4999   °[facility]    (336) 373-0678   °[facility]    (336) 272-1050   °[facility] ° [address], [place] Phone:  (336) 832-4444, Fax:  (336) 832-4440 Hours of Operation:  9 am - 6 pm, M-F.  Also accepts Medicaid/Medicare and self-pay.  °[facility] ° [address], [place] Phone: (336) 832-3150, Fax: (336) 832-3151. Hours of Operation:  8:30 am - 5:30 pm, M-F.  Also accepts Medicaid and self-pay.  °[facility] [place] [address], [place] Phone: (336) 878-6027   °[facility] [address], [place], [place] (336)723-1848, Ext. 123 Mondays & Thursdays: 7-9 AM.  First 15 patients are seen on a first come, first serve basis. °  ° °Medicaid-accepting [place] Providers: ° °Organization         Address  Phone   Notes  °[facility] [address], [place] (336) 641-2100 Also accepts self-pay patients.  °[facility] [address], [place] ° (336) 856-9996   °[facility] [address], [place] (336) 288-8857   °[facility] [address] [place] [address], [place] (336) 299-7000   °Veita Bland [address], [place]  ° (336) 373-1557 Only accepts [place] Access Medicaid patients after they have their name applied to their card.  ° °Self-Pay (no insurance) in [place]: ° °Organization         Address  Phone   Notes  °Sickle Cell Patients, [facility] [address], [place] (336) 832-1970   °[facility] [address], [place] (336) 832-4400   °[facility] ° [address], [place] (336) 992-4800   °[facility]/Dr. Osei-Bonsu ° [address] [place] [address], [place] or [address], [place] (336) 841-8500 Phone number for both [place] and [place] locations is the same.  °[facility] [address],  (336) 299-0000   °[facility]  [address] [place] [address],  or [address] (336) 852-7530 °(336) 878-2260   °  [facility] [address], [place] (336) 350-1642, phone; (336) 294-5005, fax Sees patients 1st and 3rd Saturday of every month.  Must not qualify for public or private insurance (i.e. Medicaid, Medicare, [place] Health Choice, Veterans' Benefits) • Household income should be no more than 200% of the poverty level •The clinic cannot treat you if you are pregnant or think you are pregnant • Sexually transmitted diseases are not treated at the clinic.  ° ° °Dental Care: °Organization         Address  Phone  Notes  °[facility] [address], [place] (336) 641-6152 Accepts children up to age 21 who are enrolled in Medicaid or [place] Health Choice; pregnant women with a Medicaid card; and children who have applied for Medicaid or [place] Health Choice, but were declined, whose parents can pay a reduced fee at time of service.  °[facility] [place]  [address], [place] (336) 641-7733 Accepts children up to age 21 who are  enrolled in Medicaid or [place] Health Choice; pregnant women with a Medicaid card; and children who have applied for Medicaid or [place] Health Choice, but were declined, whose parents can pay a reduced fee at time of service.  °[facility] ° [address], [place] (336) 641-4533 Patients are seen by appointment only. Walk-ins are not accepted. [facility] will see patients 18 years of age and older. °Monday - Tuesday (8am-5pm) °Most Wednesdays (8:30-5pm) °$30 per visit, cash only  °[facility] ° [address], [place] (336) 641-4533 Patients are seen by appointment only. Walk-ins are not accepted. [facility] will see patients 18 years of age and older. °One Wednesday Evening (Monthly: Volunteer Based).  $30 per visit, cash only  °[facility]  (919) 537-3737 for adults; Children under age 4, call Graduate Pediatric Dentistry at (919) [phone]. Children aged 4-14, please call (919) [phone] to request a pediatric application. ° Dental services are provided in all areas of dental care including fillings, crowns and bridges, complete and partial dentures, implants, gum treatment, root canals, and extractions. Preventive care is also provided. Treatment is provided to both adults and children. °Patients are selected via a lottery and there is often a waiting list. °  °[facility] [address], °[place] ° (336) 763-8833 [email or web address] °  °[facility] [address], [place], [place] (336)723-1848, Ext. 123 Second and Fourth Thursday of each month, opens at 6:30 AM; Clinic ends at 9 AM.  Patients are seen on a first-come first-served basis, and a limited number are seen during each clinic.  ° °[facility] ° [address], [place], [place] (336) 723-7904   Eligibility Requirements °You must have lived in [place], [place], or [place] counties for at least the last three months. °  You  cannot be eligible for state or federal sponsored healthcare insurance, including Veterans Administration, Medicaid, or Medicare. °  You generally cannot be eligible for healthcare insurance through your employer.  °  How to apply: °Eligibility screenings are held every Tuesday and Wednesday afternoon from 1:00 pm until 4:00 pm. You do not need an appointment for the interview!  °[facility] [address], [place],  336-631-2330   °[facility]  336-342-8273   °[facility]  336-703-3100   °[facility]  336-570-6415   ° °Behavioral Health Resources in the Community: °Intensive Outpatient Programs °Organization         Address  Phone  Notes  °[facility] [address], [place], [place] 336-878-6098   °[facility] [address], [place], [place] 336-832-9800   °[facility] [address], [place], [place] ° 336-882-2125   °[facility] [address],  °[place], [place] 1-800-853-5163 or 336-641-4981   °Substance Abuse Resources °Organization         Address  Phone  Notes  °[facility]  336-882-2125   °[facility]  336-784-9470   °[facility]  336-285-9073   °[facility]  336-845-3988   °Residential & Outpatient Substance Abuse Program  1-800-659-3381   °Psychological Services °Organization         Address  Phone  Notes  °[facility]  336- 832-9600   °[facility]  336- 378-7881   °[facility] [address], [place] 1-800-853-5163 or 336-641-4981   ° °Mobile Crisis Teams °Organization         Address  Phone  Notes  °[facility], Mobile Crisis Care Unit  1-877-626-1772   °Assertive °[facility] ° [address] [place], [place] 336-834-9664   °Sharon DeEsch [address] °[place] 336-554-5454   ° °Self-Help/Support  Groups °Organization         Address  Phone             Notes  °[facility] [place] - variety of support groups  336- 373-1402 Call for more information  °Narcotics Anonymous (NA), [facility] [address], °[place]  2 meetings at this location  ° °Residential Treatment Programs °Organization         Address  Phone  Notes  °[facility] [address],    °[place]  1-866-801-8205   °[facility] ° [address], [place], [place] 704-293-8524   °[facility] [address], [place] 336-845-3988 Admissions: 8am-3pm M-F  °[facility] [address]    °[place], [place] 336-841-1104   °[facility] [address], [place], [place] 336-379-7146   °[facility] [address]  °[place], [place] 336-285-9073   °[facility] - Intensive Outpatient [address], , [place] 336-852-3033   °[facility] [address]  °[place], [place] 1-877-615-2722 or 336-784-9470   °[facility] [address] [place], [place] 336-227-7417 Accepts Medicaid  °[facility] [address]  ° [place] 1-800-659-3381 Substance Abuse/Addiction Treatment  ° °[place] Behavioral Health Resources °Organization         Address  Phone  Notes  °[facility]  (888) 581-9988   °Julie Brannon, PhD [address] [place], [place]   (336) 349-5553 or (336) 951-0000   ° [facility]al   [address] °[place], [place] (336) 349-4454   °[facility] [address], [place], [place] (336) 342-8316 Insurance/Medicaid/sponsorship through [facility]  °[facility] [address]                                    [place],  (336) 342-8316 Therapy/tele-psych/case  °[facility]   [address]  ° [place], [place] (336) 349-2233    °Dr. Arfeen  (336) 349-4544   °[facility] [place]  [facility]  [employer] [facility] [place] [facility] 1) [address], [place] °2) [address], [place] °3)  [address], [place] (336) 349-3220 °(336) 342-7768 ° °(336) 342-8140   °[place] [facility] (336) 342-1394 or (336) 342-3537 (After Hours)    ° ° ° °

## 2013-12-25 NOTE — ED Provider Notes (Signed)
CSN: [id number]     Arrival date & time 12/25/13  0214 History   First MD Initiated Contact with Patient 12/25/13 0255     Chief Complaint  Patient presents with  . Dental Pain     (Consider location/radiation/quality/duration/timing/severity/associated sxs/prior Treatment) HPI Patient presents with 4 days of left-sided dental pain. She's been taking Aleve with no relief. She states pain radiates up to the left side of her face and left ear. She's had no fever or chills. Patient has been seen in the emergency department in the past for dental-related issues but states she has not seen a dentist. No facial swelling or difficulty swallowing. Past Medical History  Diagnosis Date  . Hypertension   . Kidney stones   . Placenta previa    Past Surgical History  Procedure Laterality Date  . Cesarean section    . Cesarean section N/A 06/18/2013    Procedure: CESAREAN SECTION;  Surgeon: [name], MD;  Location: [facility];  Service: Obstetrics;  Laterality: N/A;  . Cesarean section with bilateral tubal ligation Bilateral 06/18/2013    Procedure: REPEAT CESAREAN SECTION WITH BILATERAL TUBAL LIGATION;  Surgeon: [name], MD;  Location: [facility];  Service: Obstetrics;  Laterality: Bilateral;   Family History  Problem Relation Age of Onset  . Diabetes Maternal Grandfather   . Hypertension Maternal Grandfather    History  Substance Use Topics  . Smoking status: Former Smoker -- 0.25 packs/day for 14 years    Types: Cigarettes  . Smokeless tobacco: Former Neurosurgeon  . Alcohol Use: No   OB History   Grav Para Term Preterm Abortions TAB SAB Ect Mult Living   Review of Systems  Constitutional: Negative for fever and chills.  HENT: Positive for dental problem. Negative for sore throat and trouble swallowing.   Gastrointestinal: Negative for nausea and vomiting.  Skin: Negative for rash.  All other systems reviewed and are negative.     Allergies  Review of  patient's allergies indicates no known allergies.  Home Medications   Prior to Admission medications   Medication Sig Start Date End Date Taking? Authorizing Provider  naproxen sodium (ANAPROX) 220 MG tablet Take 220 mg by mouth 2 (two) times daily as needed (for pain).   Yes Historical Provider, MD   BP 116/75  Pulse 89  Temp(Src) 98.3 F (36.8 C) (Oral)  Resp 20  Ht 5' (1.524 m)  Wt 116 lb (52.617 kg)  BMI 22.65 kg/m2  SpO2 100%  LMP 11/29/2013  Breastfeeding? No Physical Exam  Nursing note and vitals reviewed. Constitutional: She is oriented to person, place, and time. She appears well-developed and well-nourished. No distress.  HENT:  Head: Normocephalic and atraumatic.  Mouth/Throat: Oropharynx is clear and moist. No oropharyngeal exudate.    Eyes: EOM are normal. Pupils are equal, round, and reactive to light.  Neck: Normal range of motion. Neck supple.  Cardiovascular: Normal rate and regular rhythm.   Pulmonary/Chest: Effort normal and breath sounds normal. No respiratory distress. She has no wheezes. She has no rales.  Abdominal: Soft. Bowel sounds are normal.  Musculoskeletal: Normal range of motion. She exhibits no edema and no tenderness.  Lymphadenopathy:    She has cervical adenopathy (shotty left submaxillary lymphadenopathy).  Neurological: She is alert and oriented to person, place, and time.  Skin: Skin is warm and dry. No rash noted. No erythema.  Psychiatric: She has a  normal mood and affect. Her behavior is normal.    ED Course  Procedures (including critical care time) Labs Review Labs Reviewed - No data to display  Imaging Review No results found.   EKG Interpretation None      MDM   Final diagnoses:  None    We'll treat with pain control and antibiotics. Patient has been referred to dentist. She is encouraged to followup.    Loren Racer, MD 12/25/13 [phone]

## 2014-02-13 ENCOUNTER — Emergency Department (HOSPITAL_COMMUNITY)
Admission: EM | Admit: 2014-02-13 | Discharge: 2014-02-13 | Disposition: A | Discharge status: Home / Self Care | Payer: Medicaid Other | Attending: Emergency Medicine | Admitting: Emergency Medicine

## 2014-02-13 ENCOUNTER — Encounter (HOSPITAL_COMMUNITY): Payer: Self-pay | Admitting: Emergency Medicine

## 2014-02-13 DIAGNOSIS — I10 Essential (primary) hypertension: Secondary | ICD-10-CM | POA: Diagnosis not present

## 2014-02-13 DIAGNOSIS — K088 Other specified disorders of teeth and supporting structures: Secondary | ICD-10-CM | POA: Insufficient documentation

## 2014-02-13 DIAGNOSIS — Z7951 Long term (current) use of inhaled steroids: Secondary | ICD-10-CM | POA: Insufficient documentation

## 2014-02-13 DIAGNOSIS — J069 Acute upper respiratory infection, unspecified: Secondary | ICD-10-CM

## 2014-02-13 DIAGNOSIS — J029 Acute pharyngitis, unspecified: Secondary | ICD-10-CM | POA: Diagnosis present

## 2014-02-13 DIAGNOSIS — K029 Dental caries, unspecified: Secondary | ICD-10-CM | POA: Insufficient documentation

## 2014-02-13 DIAGNOSIS — Z87891 Personal history of nicotine dependence: Secondary | ICD-10-CM | POA: Diagnosis not present

## 2014-02-13 DIAGNOSIS — Z87442 Personal history of urinary calculi: Secondary | ICD-10-CM | POA: Diagnosis not present

## 2014-02-13 LAB — RAPID STREP SCREEN (MED CTR MEBANE ONLY): STREPTOCOCCUS, GROUP A SCREEN (DIRECT): NEGATIVE

## 2014-02-13 MED ORDER — SODIUM CHLORIDE 0.9 % IV BOLUS (SEPSIS)
1000.0000 mL | Freq: Once | INTRAVENOUS | Status: AC
  Administered 2014-02-13: 1000 mL via INTRAVENOUS

## 2014-02-13 MED ORDER — ACETAMINOPHEN 325 MG PO TABS
650.0000 mg | ORAL_TABLET | Freq: Once | ORAL | Status: AC
  Administered 2014-02-13: 650 mg via ORAL
  Filled 2014-02-13: qty 2

## 2014-02-13 MED ORDER — DEXAMETHASONE SODIUM PHOSPHATE 10 MG/ML IJ SOLN
10.0000 mg | Freq: Once | INTRAMUSCULAR | Status: AC
  Administered 2014-02-13: 10 mg via INTRAVENOUS
  Filled 2014-02-13: qty 1

## 2014-02-13 MED ORDER — FLUTICASONE PROPIONATE 50 MCG/ACT NA SUSP: 2.0000 | Freq: Every day | NASAL | Status: DC

## 2014-02-13 MED ORDER — BENZOCAINE 20 % MT SOLN: 1.0000 "application " | Freq: Three times a day (TID) | OROMUCOSAL | Status: DC | PRN

## 2014-02-13 NOTE — ED Notes (Signed)
Sore throat, body aches for last few days. Tachycardia. Felt like she had a fever a few days ago.

## 2014-02-13 NOTE — Discharge Instructions (Signed)
1. Medications: Flonase, Hurricaine spray, usual home medications 2. Treatment: rest, drink plenty of fluids, take tylenol or ibuprofen for fever control 3. Follow Up: Please followup with your primary doctor in 3 days for discussion of your diagnoses and further evaluation after today's visit; if you do not have a primary care doctor use the resource guide provided to find one; Return to the ER for high fevers, difficulty breathing or other concerning symptoms   Pharyngitis Pharyngitis is redness, pain, and swelling (inflammation) of your pharynx.  CAUSES  Pharyngitis is usually caused by infection. Most of the time, these infections are from viruses (viral) and are part of a cold. However, sometimes pharyngitis is caused by bacteria (bacterial). Pharyngitis can also be caused by allergies. Viral pharyngitis may be spread from person to person by coughing, sneezing, and personal items or utensils (cups, forks, spoons, toothbrushes). Bacterial pharyngitis may be spread from person to person by more intimate contact, such as kissing.  SIGNS AND SYMPTOMS  Symptoms of pharyngitis include:   Sore throat.   Tiredness (fatigue).   Low-grade fever.   Headache.  Joint pain and muscle aches.  Skin rashes.  Swollen lymph nodes.  Plaque-like film on throat or tonsils (often seen with bacterial pharyngitis). DIAGNOSIS  Your health care provider will ask you questions about your illness and your symptoms. Your medical history, along with a physical exam, is often all that is needed to diagnose pharyngitis. Sometimes, a rapid strep test is done. Other lab tests may also be done, depending on the suspected cause.  TREATMENT  Viral pharyngitis will usually get better in 3-4 days without the use of medicine. Bacterial pharyngitis is treated with medicines that kill germs (antibiotics).  HOME CARE INSTRUCTIONS   Drink enough water and fluids to keep your urine clear or pale yellow.   Only take  over-the-counter or prescription medicines as directed by your health care provider:   If you are prescribed antibiotics, make sure you finish them even if you start to feel better.   Do not take aspirin.   Get lots of rest.   Gargle with 8 oz of salt water ( tsp of salt per 1 qt of water) as often as every 1-2 hours to soothe your throat.   Throat lozenges (if you are not at risk for choking) or sprays may be used to soothe your throat. SEEK MEDICAL CARE IF:   You have large, tender lumps in your neck.  You have a rash.  You cough up green, yellow-brown, or bloody spit. SEEK IMMEDIATE MEDICAL CARE IF:   Your neck becomes stiff.  You drool or are unable to swallow liquids.  You vomit or are unable to keep medicines or liquids down.  You have severe pain that does not go away with the use of recommended medicines.  You have trouble breathing (not caused by a stuffy nose). MAKE SURE YOU:   Understand these instructions.  Will watch your condition.  Will get help right away if you are not doing well or get worse. Document Released: 04/11/2005 Document Revised: 01/30/2013 Document Reviewed: 12/17/2012 [facility] Patient Information 2015 [place], [place]. This information is not intended to replace advice given to you by your health care provider. Make sure you discuss any questions you have with your health care provider.   Emergency Department Resource Guide 1) Find a Doctor and Pay Out of Pocket Although you won't have to find out who is covered by your insurance plan, it is a good  idea to ask around and get recommendations. You will then need to call the office and see if the doctor you have chosen will accept you as a new patient and what types of options they offer for patients who are self-pay. Some doctors offer discounts or will set up payment plans for their patients who do not have insurance, but you will need to ask so you aren't surprised when you get to your  appointment.  2) Contact Your Local Health Department Not all health departments have doctors that can see patients for sick visits, but many do, so it is worth a call to see if yours does. If you don't know where your local health department is, you can check in your phone book. The CDC also has a tool to help you locate your state's health department, and many state websites also have listings of all of their local health departments.  3) Find a Walk-in Clinic If your illness is not likely to be very severe or complicated, you may want to try a walk in clinic. These are popping up all over the country in pharmacies, drugstores, and shopping centers. They're usually staffed by nurse practitioners or physician assistants that have been trained to treat common illnesses and complaints. They're usually fairly quick and inexpensive. However, if you have serious medical issues or chronic medical problems, these are probably not your best option.  No Primary Care Doctor: - Call Health Connect at  [phone] - they can help you locate a primary care doctor that  accepts your insurance, provides certain services, etc. - Physician Referral Service- [phone]  Chronic Pain Problems: Organization         Address  Phone   Notes  [facility]  [phone] Patients need to be referred by their primary care doctor.   Medication Assistance: Organization         Address  Phone   Notes  [facility] Medication [facility] [address] [place], [place] [address] [phone]) [phone] --Must be a resident of [facility] [place] [facility] -- Must have NO insurance coverage whatsoever (no Medicaid/ Medicare, etc.) -- The pt. MUST have a primary care doctor that directs their care regularly and follows them in the community   MedAssist  [phone]   [employer]  [phone]) [phone]    Agencies that provide inexpensive medical care: Organization         Address  Phone   Notes  [facility]  [phone]) [phone]   [facility]    [phone]) [phone]   [facility] [address] [place], [place] [address] [phone]   [facility] [place] [place] [address], [place] [phone]) [phone]   Planned Parenthood    [phone]) [phone]   [facility]    [phone]   [facility] and [facility]  [address], [place] Phone:  [phone](336) [phone], Fax:  [phone] Hours of Operation:  9 am - 6 pm, M-F.  Also accepts Medicaid/Medicare and self-pay.  [facility]  [address], [place] Phone: [phone](336) [phone], Fax: [phone]) [phone]. Hours of Operation:  8:30 am - 5:30 pm, M-F.  Also accepts Medicaid and self-pay.  [facility] [place] [address], [place] Phone: [phone]) [phone]   [facility] [address], [place], [place] [phone], Ext. [phone] Mondays & Thursdays: 7-9 AM.  First 15  patients are seen on a first come, first serve basis.    Medicaid-accepting [facility] Providers:  Organization         Address  Phone   Notes  [facility] [address], [place] [phone]) [phone] Also accepts self-pay patients.  [facility] [address], [place]  [phone]) [phone]   [facility] [address], [address], [place] [phone](336) [phone]   [facility] [address] [place] [address], [place] [phone]) [phone]   Renaye RakersVeita Bland [address], [place]   [phone]) [phone] Only accepts [place] Access [place]icaid patients after they have their name applied to their card.   Self-Pay (no insurance) in [facility]:  Organization         Address  Phone   Notes  Sickle Cell Patients, [facility] [address], [place] [phone](336) [phone]   [facility] [address], [place] [phone]) [phone]   [facility]  [place]  [address], [address], [place] [phone]   [facility]/Dr. Osei-Bonsu  [address] [place] [address], [place] or [phone], [place] [phone] Phone number for both [place] and [place] locations is the same.  [facility] [address], [place] [phone](336) [phone]   [facility] [place] [address] [place] [address], [place] or [address] [phone]) [phone]) [phone]   [facility] [address], [place] [phone]) [phone], phone; [phone]) [phone], fax Sees patients 1st and 3rd Saturday of every month.  Must not qualify for public or private insurance (i.e. Medicaid, Medicare, [place] Health Choice, Veterans' Benefits)  Household income should be no more than 200% of the poverty level The clinic cannot treat you if you are pregnant or think you are pregnant  Sexually transmitted diseases are not treated at the clinic.    Dental Care: Organization         Address  Phone  Notes  [facility] Department of [facility] [address], [place] [phone]) [phone] Accepts children up to age 29 who are enrolled in [place]icaid or [place] Health Choice; pregnant women with a Medicaid card; and children who have applied for Medicaid or [place] Health Choice, but were declined, whose parents can pay a reduced fee at time of service.  [facility] Department of [facility] [place]  [address], [place] [phone]) [phone] Accepts children up to age 721 who are enrolled in [place]icaid or [place] Health Choice; pregnant women with a Medicaid card; and children who have applied for Medicaid or [place] Health Choice, but were declined, whose parents can pay a reduced fee at time of service.  [facility]  [address], [place] [phone] Patients are seen by appointment only. Walk-ins are not accepted. [facility] will see patients 29 years of age and  older. Monday - Tuesday (8am-5pm) Most Wednesdays (8:30-5pm) $30 per visit, cash only  [facility]  [address], [facility] [phone] Patients are seen by appointment only. Walk-ins are not accepted. [facility] will see patients 29 years of age and older. One Wednesday Evening (Monthly: Volunteer Based).  $30 per visit, cash only  [employer] [facility]  [phone] for adults; Children under age 644, call Graduate Pediatric Dentistry  at [phone]) [phone]. Children aged 29-14, please call [phone] to request a pediatric application.  Dental services are provided in all areas of dental care including fillings, crowns and bridges, complete and partial dentures, implants, gum treatment, root canals, and extractions. Preventive care is also provided. Treatment is provided to both adults and children. Patients are selected via a lottery and there is often a waiting list.   [facility] [address], [place]  [phone]) [phone] [email or web address]   [facility] [address], [place], [place] [phone], Ext. [phone] Second and Fourth Thursday of each month, opens at 6:30 AM; Clinic ends at 9 AM.  Patients are seen on a first-come first-served basis, and a limited number are seen during each clinic.   [facility]  [address], [place], [place] [phone](336) [phone]   Eligibility Requirements You must have lived in [place], [place], or [place] counties for at least the last three months.   You cannot be eligible for state or federal sponsored National Cityhealthcare insurance, including CIGNAVeterans Administration, [place]Medicaid, or Harrah[employer] EntertainmentMedicare.   You generally cannot be eligible for healthcare insurance through your employer.    How to apply: Eligibility screenings are held every Tuesday and Wednesday afternoon from 1:00 pm until 4:00 pm. You do not need an appointment for the interview!  [facility] [address],  [place], [place] [phone]   [facility]  [phone]   [facility]  [phone]   [facility]  [phone]    Behavioral Health Resources in the Community: Intensive Outpatient Programs Organization         Address  Phone  Notes  [facility] [address], [place], [place] [phone]   [facility]patient [address], [place], [place] [phone]   [facility]: [facility] Drug S[facility] [address], [place], [place]  [phone]   [facility] [address],  [place], [place] [phone] or [phone]   Substance Abuse Resources Organization         Address  Phone  Notes  [facility] Drug [facility]  [phone]   [facility]  [phone]   [facility]  [phone]   [facility]  [phone]   Residential & Outpatient Substance Abuse Program  [phone]   Psychological Services Organization         Address  Phone  Notes  [facility]  [phone]- [phone]   [facility]  [phone]- [phone]   [facility] [address], [place] [phone] or [phone]    Mobile Crisis Teams Organization         Address  Phone  Notes  [facility], Mobile Crisis Care Unit  [phone]   [facility]  [address] [place], [place] [phone]   Doristine LocksSharon DeEsch [address] [place] [phone]    Self-Help/Support Groups Organization         Address  Phone             Notes  Mental Health Assoc. of [place] - variety of support groups  [phone] Call for more information  Narcotics Anonymous (NA), Caring [facility] [address], [place]  2 meetings at this location   Chief Executive Officeresidential Treatment Programs Organization         Address  Phone  Notes  [facility] [address],    [place]  [phone]   [facility]  [address], [place] [phone], [place], [place] [phone]   [facility] [address], [place] [phone] Admissions: 8am-3pm M-F  [facility] [address]    [place], [place] [phone]   [facility] [address], [place], [place] [phone]   [facility] [address]  [place], [place] [phone]   [facility] - Intensive Outpatient [address], [place], [place] [phone]   [facility] ([facility] [address]  [place], [place] [phone] or [phone]   [facility] (RTS) [address] [place], [place] [phone] Accepts Medicaid  [facility] [address]  [place] [phone] Substance Abuse/Addiction Treatment   [facility] Behavioral Health Resources Organization         Address  Phone  Notes  [facility]  [phone]   Angie FavaJulie Brannon, PhD [address], [address] [place], [place]   [phone](336) [phone] or [phone](336) [phone]   [facility]   [address] [place], [place] [phone]) [phone]   [facility] [address], [place], [place] [phone]) [phone] Insurance/Medicaid/sponsorship through [facility]  [facility]ith [facility] Families [address]                                    [place], [place] [phone](336) [phone] Therapy/tele-psych/case  [facility] [address].   [place], [place] [phone](336) [phone]    Dr. Lolly MustacheArfeen  [phone](336) [phone]   [facility] [place]  [employer] [facility]. 1) [address], [place] 2) [address], [place] 3)  [address], [place] [phone](336) [phone](336) [phone]  [phone](336) [phone]   [facility] Child Abuse Hotline [phone]) [phone] or [phone](336) [phone] (After Hours)

## 2014-02-13 NOTE — ED Provider Notes (Signed)
CSN: [id number]     Arrival date & time 02/13/14  1712 History  This chart was scribed for non-physician practitioner, Dierdre ForthHannah Lavonn Maxcy, PA-C, working with Audree CamelScott T Goldston, MD, by Bronson CurbJacqueline Melvin, ED Scribe. This patient was seen in room TR08C/TR08C and the patient's care was started at 5:48 PM.     Chief Complaint  Patient presents with  . Sore Throat  . Generalized Body Aches    The history is provided by the patient. No language interpreter was used.    HPI Comments: [name] CongoLanika Burget is a 28 y.o. female who presents to the Emergency Department complaining of sore throat onset 3 days ago. There is associated subjective fever (triage temp 99 F), generalized body aches, and right sided dental pain. Patient also notes pain with swallowing and eating. She has taken Tylenol PM without significant improvement. Patient has history of HTN and kidney stones. LNMP 01/23/14   Past Medical History  Diagnosis Date  . Hypertension   . Kidney stones   . Placenta previa    Past Surgical History  Procedure Laterality Date  . Cesarean section    . Cesarean section N/A 06/18/2013    Procedure: CESAREAN SECTION;  Surgeon: Kathreen CosierBernard A Marshall, MD;  Location: [facility];  Service: Obstetrics;  Laterality: N/A;  . Cesarean section with bilateral tubal ligation Bilateral 06/18/2013    Procedure: REPEAT CESAREAN SECTION WITH BILATERAL TUBAL LIGATION;  Surgeon: Kathreen CosierBernard A Marshall, MD;  Location: [facility];  Service: Obstetrics;  Laterality: Bilateral;   Family History  Problem Relation Age of Onset  . Diabetes Maternal Grandfather   . Hypertension Maternal Grandfather    History  Substance Use Topics  . Smoking status: Former Smoker -- 0.25 packs/day for 14 years    Types: Cigarettes  . Smokeless tobacco: Former NeurosurgeonUser  . Alcohol Use: No   OB History   Grav Para Term Preterm Abortions TAB SAB Ect Mult Living   3 3 2 1      3      Review of Systems  Constitutional: Positive for fatigue. Negative for  fever, chills and appetite change.  HENT: Positive for congestion, dental problem, postnasal drip, rhinorrhea, sinus pressure and sore throat. Negative for ear discharge, ear pain, mouth sores and trouble swallowing.   Eyes: Negative for visual disturbance.  Respiratory: Negative for cough, chest tightness, shortness of breath, wheezing and stridor.   Cardiovascular: Negative for chest pain, palpitations and leg swelling.  Gastrointestinal: Negative for nausea, vomiting, abdominal pain and diarrhea.  Genitourinary: Negative for dysuria, urgency, frequency and hematuria.  Musculoskeletal: Positive for myalgias. Negative for arthralgias, back pain and neck stiffness.  Skin: Negative for rash.  Neurological: Negative for syncope, light-headedness, numbness and headaches.  Hematological: Negative for adenopathy.  Psychiatric/Behavioral: The patient is not nervous/anxious.   All other systems reviewed and are negative.     Allergies  Review of patient's allergies indicates no known allergies.  Home Medications   Prior to Admission medications   Medication Sig Start Date End Date Taking? Authorizing Provider  benzocaine (HURRICAINE) 20 % SOLN Use as directed 1 application in the mouth or throat 3 (three) times daily as needed. As needed for sore throat 02/13/14   Dahlia ClientHannah Pierson Vantol, PA-C  fluticasone (FLONASE) 50 MCG/ACT nasal spray Place 2 sprays into both nostrils daily. 02/13/14   Simon Llamas, PA-C  naproxen sodium (ANAPROX) 220 MG tablet Take 220 mg by mouth 2 (two) times daily as needed (for pain).    Historical Provider, MD  oxyCODONE-acetaminophen (PERCOCET) 5-325 MG per tablet Take 1 tablet by mouth every 4 (four) hours as needed for severe pain. 12/25/13   Loren Raceravid Yelverton, MD   BP 118/105  Pulse 124  Temp(Src) 99 F (37.2 C) (Oral)  Resp 16  Ht 5\' 1"  (1.549 m)  Wt 117 lb (53.071 kg)  BMI 22.12 kg/m2  SpO2 98%  Physical Exam  Nursing note and vitals  reviewed. Constitutional: She is oriented to person, place, and time. She appears well-developed and well-nourished. No distress.  HENT:  Head: Normocephalic and atraumatic.  Right Ear: Tympanic membrane, external ear and ear canal normal.  Left Ear: Tympanic membrane, external ear and ear canal normal.  Nose: Nose normal. No mucosal edema or rhinorrhea. No epistaxis. Right sinus exhibits no maxillary sinus tenderness and no frontal sinus tenderness. Left sinus exhibits no maxillary sinus tenderness and no frontal sinus tenderness.  Mouth/Throat: Uvula is midline. Mucous membranes are not pale, dry and not cyanotic. No trismus in the jaw. Abnormal dentition. Dental caries present. No uvula swelling. Posterior oropharyngeal erythema present. No oropharyngeal exudate, posterior oropharyngeal edema or tonsillar abscesses.  Posterior oropharynx without edema and exudate on the tonsils, mild erythema. No swelling of the uvula. Multiple dental caries throughout to tooth number 30 and 31 with large cavities. At the site of patient's pain no erythema or induration to the gingiva. No gross abscess. No tenderness or induration to the floor of the mouth.  Eyes: Conjunctivae are normal. Pupils are equal, round, and reactive to light.  Neck: Normal range of motion, full passive range of motion without pain and phonation normal. No tracheal tenderness, no spinous process tenderness and no muscular tenderness present. No rigidity. No erythema and normal range of motion present. No Brudzinski's sign and no Kernig's sign noted.  Range of motion without pain no No midline or paraspinal tenderness Normal phonation No stridor Handling secretions without difficulty No nuchal rigidity or meningeal signs  Cardiovascular: Normal rate, regular rhythm, normal heart sounds and intact distal pulses.   Pulses:      Radial pulses are 2+ on the right side, and 2+ on the left side.  Pulmonary/Chest: Effort normal and breath  sounds normal. No stridor. No respiratory distress. She has no decreased breath sounds. She has no wheezes.  Equal chest expansion, clear and equal breath sounds without focal wheezes, rhonchi or rales  Abdominal: Soft. Bowel sounds are normal. There is no tenderness.  Musculoskeletal: Normal range of motion.  Lymphadenopathy:       Head (right side): Submandibular and tonsillar adenopathy present. No submental, no preauricular, no posterior auricular and no occipital adenopathy present.       Head (left side): Submandibular and tonsillar adenopathy present. No submental, no preauricular, no posterior auricular and no occipital adenopathy present.    She has cervical adenopathy.       Right cervical: Superficial cervical adenopathy present. No deep cervical and no posterior cervical adenopathy present.      Left cervical: Superficial cervical adenopathy present. No deep cervical and no posterior cervical adenopathy present.  Neurological: She is alert and oriented to person, place, and time.  Alert and oriented Moves all extremities without ataxia  Skin: Skin is warm and dry. No rash noted. She is not diaphoretic.  Psychiatric: She has a normal mood and affect.    ED Course  Procedures (including critical care time)  DIAGNOSTIC STUDIES: Oxygen Saturation is 98% on room air, normal by my interpretation.    COORDINATION OF  CARE: At 1752 Discussed treatment plan with patient which includes rapid strep screen, Tylenol and IV fluids. Patient agrees.   Labs Review Labs Reviewed  RAPID STREP SCREEN  CULTURE, GROUP A STREP    Imaging Review No results found.   EKG Interpretation None      MDM   Final diagnoses:  Viral URI  Viral pharyngitis    [name] Hendrix presents with sore throat, rhinorrhea and general illness.  Pt afebrile without tonsillar exudate, negative strep. Presents with mild cervical lymphadenopathy, & dysphagia; diagnosis of viral pharyngitis. No abx indicated.  DC w symptomatic tx for pain  Pt with clear and equal breath sounds, no indication for CXR at this time.  Pt appears dehydrated, IV fluids given with resolution of tachycardia,  did discuss importance of water rehydration. Presentation non concerning for PTA or infxn spread to soft tissue. No trismus or uvula deviation. Specific return precautions discussed. Pt able to drink water in ED without difficulty with intact air way. Recommended PCP follow up.  I have personally reviewed patient's vitals, nursing note and any pertinent labs or imaging.  I performed an focused physical exam; undressed when appropriate .    It has been determined that no acute conditions requiring further emergency intervention are present at this time. The patient/guardian have been advised of the diagnosis and plan. I reviewed any labs and imaging including any potential incidental findings. We have discussed signs and symptoms that warrant return to the ED and they are listed in the discharge instructions.    Vital signs are stable at discharge.   BP 118/105  Pulse 88  Temp(Src) 98.8 F (37.1 C) (Oral)  Resp 18  Ht 5\' 1"  (1.549 m)  Wt 117 lb (53.071 kg)  BMI 22.12 kg/m2  SpO2 100%  I personally performed the services described in this documentation, which was scribed in my presence. The recorded information has been reviewed and is accurate.    Dahlia Client Zoey Gilkeson, PA-C 02/13/14 2015

## 2014-02-13 NOTE — ED Notes (Signed)
Declined W/C at D/C and was escorted to lobby by RN. 

## 2014-02-15 LAB — CULTURE, GROUP A STREP

## 2014-02-16 NOTE — ED Provider Notes (Signed)
Medical screening examination/treatment/procedure(s) were performed by non-physician practitioner and as supervising physician I was immediately available for consultation/collaboration.   EKG Interpretation None        Samit Sylve T Shelbia Scinto, MD 02/16/14 2332 

## 2014-02-24 ENCOUNTER — Encounter (HOSPITAL_COMMUNITY): Payer: Self-pay | Admitting: Emergency Medicine

## 2014-03-01 ENCOUNTER — Encounter (HOSPITAL_COMMUNITY): Payer: Self-pay | Admitting: Cardiology

## 2014-03-01 ENCOUNTER — Emergency Department (HOSPITAL_COMMUNITY)
Admission: EM | Admit: 2014-03-01 | Discharge: 2014-03-01 | Disposition: A | Discharge status: Home / Self Care | Payer: Medicaid Other | Attending: Emergency Medicine | Admitting: Emergency Medicine

## 2014-03-01 DIAGNOSIS — I10 Essential (primary) hypertension: Secondary | ICD-10-CM | POA: Diagnosis not present

## 2014-03-01 DIAGNOSIS — Z87442 Personal history of urinary calculi: Secondary | ICD-10-CM | POA: Diagnosis not present

## 2014-03-01 DIAGNOSIS — K029 Dental caries, unspecified: Secondary | ICD-10-CM | POA: Insufficient documentation

## 2014-03-01 DIAGNOSIS — Z720 Tobacco use: Secondary | ICD-10-CM | POA: Insufficient documentation

## 2014-03-01 DIAGNOSIS — K0889 Other specified disorders of teeth and supporting structures: Secondary | ICD-10-CM

## 2014-03-01 DIAGNOSIS — Z7951 Long term (current) use of inhaled steroids: Secondary | ICD-10-CM | POA: Insufficient documentation

## 2014-03-01 DIAGNOSIS — K047 Periapical abscess without sinus: Secondary | ICD-10-CM

## 2014-03-01 DIAGNOSIS — K088 Other specified disorders of teeth and supporting structures: Secondary | ICD-10-CM | POA: Diagnosis present

## 2014-03-01 MED ORDER — AMOXICILLIN 500 MG PO CAPS
500.0000 mg | ORAL_CAPSULE | Freq: Once | ORAL | Status: AC
  Administered 2014-03-01: 500 mg via ORAL
  Filled 2014-03-01: qty 1

## 2014-03-01 MED ORDER — HYDROCODONE-ACETAMINOPHEN 5-325 MG PO TABS: 1.0000 | ORAL_TABLET | Freq: Once | ORAL | Status: DC

## 2014-03-01 MED ORDER — HYDROCODONE-ACETAMINOPHEN 5-325 MG PO TABS: 1.0000 | ORAL_TABLET | Freq: Four times a day (QID) | ORAL | Status: DC | PRN

## 2014-03-01 MED ORDER — OXYCODONE-ACETAMINOPHEN 5-325 MG PO TABS
1.0000 | ORAL_TABLET | Freq: Once | ORAL | Status: AC
  Administered 2014-03-01: 1 via ORAL
  Filled 2014-03-01: qty 1

## 2014-03-01 MED ORDER — AMOXICILLIN 500 MG PO CAPS
500.0000 mg | ORAL_CAPSULE | Freq: Three times a day (TID) | ORAL | Status: DC
Start: 2014-03-01 — End: 2014-10-16

## 2014-03-01 NOTE — ED Provider Notes (Signed)
CSN: [id number]     Arrival date & time 03/01/14  1621 History  This chart was scribed for non-physician practitioner, Jaynie Crumbleatyana Lexie Koehl, PA-C,working with Gerhard Munchobert Lockwood, MD, by Karle PlumberJennifer Tensley, ED Scribe. This patient was seen in room TR10C/TR10C and the patient's care was started at 5:09 PM.  Chief Complaint  Patient presents with  . Dental Pain   Patient is a 28 y.o. female presenting with tooth pain. The history is provided by the patient. No language interpreter was used.  Dental Pain Associated symptoms: no fever     HPI Comments:  [name] Hendrix is a 28 y.o. female with PMH of HTN and kidney stones who presents to the Emergency Department complaining of severe, worsening, constant left upper and lower dental pain that began two weeks ago. She reports taking Ibuprofen with no significant relief of the pain. She reports eating and touching her face makes the pain worse. Denies alleviating factors. She states she has contacted a dentist but is waiting for them to call her back. Denies fever, chills, facial swelling, nausea, vomiting, inability to swallow, sore throat or otalgia.  Past Medical History  Diagnosis Date  . Hypertension   . Kidney stones   . Placenta previa    Past Surgical History  Procedure Laterality Date  . Cesarean section    . Cesarean section N/A 06/18/2013    Procedure: CESAREAN SECTION;  Surgeon: Kathreen CosierBernard A Marshall, MD;  Location: [facility];  Service: Obstetrics;  Laterality: N/A;  . Cesarean section with bilateral tubal ligation Bilateral 06/18/2013    Procedure: REPEAT CESAREAN SECTION WITH BILATERAL TUBAL LIGATION;  Surgeon: Kathreen CosierBernard A Marshall, MD;  Location: [facility];  Service: Obstetrics;  Laterality: Bilateral;   Family History  Problem Relation Age of Onset  . Diabetes Maternal Grandfather   . Hypertension Maternal Grandfather    History  Substance Use Topics  . Smoking status: Current Some Day Smoker -- 0.25 packs/day for 14 years    Types: Cigarettes   . Smokeless tobacco: Former NeurosurgeonUser  . Alcohol Use: No   OB History    Gravida Para Term Preterm AB TAB SAB Ectopic Multiple Living   3 3 2 1      3      Review of Systems  Constitutional: Negative for fever and chills.  HENT: Positive for dental problem. Negative for ear pain, sore throat and trouble swallowing.   Gastrointestinal: Negative for nausea and vomiting.    Allergies  Review of patient's allergies indicates no known allergies.  Home Medications   Prior to Admission medications   Medication Sig Start Date End Date Taking? Authorizing Provider  benzocaine (HURRICAINE) 20 % SOLN Use as directed 1 application in the mouth or throat 3 (three) times daily as needed. As needed for sore throat 02/13/14   Dahlia ClientHannah Muthersbaugh, PA-C  fluticasone (FLONASE) 50 MCG/ACT nasal spray Place 2 sprays into both nostrils daily. 02/13/14   Hannah Muthersbaugh, PA-C  naproxen sodium (ANAPROX) 220 MG tablet Take 220 mg by mouth 2 (two) times daily as needed (for pain).    Historical Provider, MD  oxyCODONE-acetaminophen (PERCOCET) 5-325 MG per tablet Take 1 tablet by mouth every 4 (four) hours as needed for severe pain. 12/25/13   Loren Raceravid Yelverton, MD   Triage Vitals: BP 108/91 mmHg  Pulse 110  Temp(Src) 98.1 F (36.7 C)  Resp 18  SpO2 99%  LMP 02/18/2014 Physical Exam  Constitutional: She is oriented to person, place, and time. She appears well-developed and well-nourished.  HENT:  Head: Normocephalic and atraumatic.  Poor oral hygiene, multiple caries, multiple fillings missing, food between teeth and on the gumline. Tender to palpation over right lower first molar, left first and second molars, left upper first and second molars. No obvious abscess, no facial swelling. No trismus. No swelling under the tongue.    Eyes: EOM are normal.  Neck: Normal range of motion. Neck supple.  Cardiovascular: Normal rate.   Pulmonary/Chest: Effort normal.  Musculoskeletal: Normal range of motion.   Neurological: She is alert and oriented to person, place, and time.  Skin: Skin is warm and dry.  Psychiatric: She has a normal mood and affect. Her behavior is normal.  Nursing note and vitals reviewed.   ED Course  Procedures (including critical care time) DIAGNOSTIC STUDIES: Oxygen Saturation is 99% on RA, normal by my interpretation.   COORDINATION OF CARE: 5:13 PM- Will prescribe pain medication, antibiotic and refer to dentist. Pt verbalizes understanding and agrees to plan.  Medications  oxyCODONE-acetaminophen (PERCOCET/ROXICET) 5-325 MG per tablet 1 tablet (1 tablet Oral Given 03/01/14 1636)    Labs Review Labs Reviewed - No data to display  Imaging Review No results found.   EKG Interpretation None      MDM   Final diagnoses:  Pain, dental  Infected dental carries   Patient with multiple dental caries, dental pain, widespread dental decay. No obvious abscess, no trismus, no evidence of lymphatics angina, she is afebrile. Will treat with amoxicillin, Norco for pain, follow-up with a dentist.  Filed Vitals:   03/01/14 1628  BP: 108/91  Pulse: 110  Temp: 98.1 F (36.7 C)  Resp: 18  SpO2: 99%     I personally performed the services described in this documentation, which was scribed in my presence. The recorded information has been reviewed and is accurate.    Lottie Musselatyana A Adalene Gulotta, PA-C 03/01/14 1729  Gerhard Munchobert Lockwood, MD 03/01/14 2012

## 2014-03-01 NOTE — Discharge Instructions (Signed)
Continue ibuprofen. Amoxil for infection. norco as prescribed for severe pain. Follow up with a dentist.    Dental Caries Dental caries (also called tooth decay) is the most common oral disease. It can occur at any age but is more common in children and young adults.  HOW DENTAL CARIES DEVELOPS  The process of decay begins when bacteria and foods (particularly sugars and starches) combine in your mouth to produce plaque. Plaque is a substance that sticks to the hard, outer surface of a tooth (enamel). The bacteria in plaque produce acids that attack enamel. These acids may also attack the root surface of a tooth (cementum) if it is exposed. Repeated attacks dissolve these surfaces and create holes in the tooth (cavities). If left untreated, the acids destroy the other layers of the tooth.  RISK FACTORS  Frequent sipping of sugary beverages.   Frequent snacking on sugary and starchy foods, especially those that easily get stuck in the teeth.   Poor oral hygiene.   Dry mouth.   Substance abuse such as methamphetamine abuse.   Broken or poor-fitting dental restorations.   Eating disorders.   Gastroesophageal reflux disease (GERD).   Certain radiation treatments to the head and neck. SYMPTOMS In the early stages of dental caries, symptoms are seldom present. Sometimes white, chalky areas may be seen on the enamel or other tooth layers. In later stages, symptoms may include:  Pits and holes on the enamel.  Toothache after sweet, hot, or cold foods or drinks are consumed.  Pain around the tooth.  Swelling around the tooth. DIAGNOSIS  Most of the time, dental caries is detected during a regular dental checkup. A diagnosis is made after a thorough medical and dental history is taken and the surfaces of your teeth are checked for signs of dental caries. Sometimes special instruments, such as lasers, are used to check for dental caries. Dental X-ray exams may be taken so that areas  not visible to the eye (such as between the contact areas of the teeth) can be checked for cavities.  TREATMENT  If dental caries is in its early stages, it may be reversed with a fluoride treatment or an application of a remineralizing agent at the dental office. Thorough brushing and flossing at home is needed to aid these treatments. If it is in its later stages, treatment depends on the location and extent of tooth destruction:   If a small area of the tooth has been destroyed, the destroyed area will be removed and cavities will be filled with a material such as gold, silver amalgam, or composite resin.   If a large area of the tooth has been destroyed, the destroyed area will be removed and a cap (crown) will be fitted over the remaining tooth structure.   If the center part of the tooth (pulp) is affected, a procedure called a root canal will be needed before a filling or crown can be placed.   If most of the tooth has been destroyed, the tooth may need to be pulled (extracted). HOME CARE INSTRUCTIONS You can prevent, stop, or reverse dental caries at home by practicing good oral hygiene. Good oral hygiene includes:  Thoroughly cleaning your teeth at least twice a day with a toothbrush and dental floss.   Using a fluoride toothpaste. A fluoride mouth rinse may also be used if recommended by your dentist or health care provider.   Restricting the amount of sugary and starchy foods and sugary liquids you consume.  Avoiding frequent snacking on these foods and sipping of these liquids.   Keeping regular visits with a dentist for checkups and cleanings. PREVENTION   Practice good oral hygiene.  Consider a dental sealant. A dental sealant is a coating material that is applied by your dentist to the pits and grooves of teeth. The sealant prevents food from being trapped in them. It may protect the teeth for several years.  Ask about fluoride supplements if you live in a  community without fluorinated water or with water that has a low fluoride content. Use fluoride supplements as directed by your dentist or health care provider.  Allow fluoride varnish applications to teeth if directed by your dentist or health care provider. Document Released: 01/01/2002 Document Revised: 08/26/2013 Document Reviewed: 04/13/2012 [facility] Patient Information 2015 [employer], [place]. This information is not intended to replace advice given to you by your health care provider. Make sure you discuss any questions you have with your health care provider.

## 2014-03-01 NOTE — ED Notes (Signed)
Pt reports pain to the left side of her mouth and face due to an infected tooth that needs to be pulled. Pt denies any fever.

## 2014-05-21 ENCOUNTER — Emergency Department (HOSPITAL_COMMUNITY): Payer: Medicaid Other

## 2014-05-21 ENCOUNTER — Emergency Department (HOSPITAL_COMMUNITY)
Admission: EM | Admit: 2014-05-21 | Discharge: 2014-05-21 | Disposition: A | Discharge status: Home / Self Care | Payer: Medicaid Other | Attending: Emergency Medicine | Admitting: Emergency Medicine

## 2014-05-21 ENCOUNTER — Encounter (HOSPITAL_COMMUNITY): Payer: Self-pay

## 2014-05-21 DIAGNOSIS — Z720 Tobacco use: Secondary | ICD-10-CM | POA: Diagnosis not present

## 2014-05-21 DIAGNOSIS — Z87442 Personal history of urinary calculi: Secondary | ICD-10-CM | POA: Insufficient documentation

## 2014-05-21 DIAGNOSIS — I10 Essential (primary) hypertension: Secondary | ICD-10-CM | POA: Diagnosis not present

## 2014-05-21 DIAGNOSIS — M25571 Pain in right ankle and joints of right foot: Secondary | ICD-10-CM | POA: Diagnosis present

## 2014-05-21 DIAGNOSIS — Z7951 Long term (current) use of inhaled steroids: Secondary | ICD-10-CM | POA: Diagnosis not present

## 2014-05-21 DIAGNOSIS — Z792 Long term (current) use of antibiotics: Secondary | ICD-10-CM | POA: Insufficient documentation

## 2014-05-21 MED ORDER — TRAMADOL-ACETAMINOPHEN 37.5-325 MG PO TABS: 1.0000 | ORAL_TABLET | Freq: Four times a day (QID) | ORAL | Status: DC | PRN

## 2014-05-21 NOTE — ED Provider Notes (Signed)
CSN: [id number]     Arrival date & time 05/21/14  1200 History  This chart was scribed for non-physician practitioner, Sharilyn SitesLisa Katelynn Heidler, PA-C working with Enid SkeensJoshua M Zavitz, MD by Luisa DagoPriscilla Tutu, ED scribe. This patient was seen in room TR09C/TR09C and the patient's care was started at 12:17 PM.    Chief Complaint  Patient presents with  . Ankle Injury   The history is provided by the patient and medical records. No language interpreter was used.   HPI Comments: [name] Hendrix is a 29 y.o. female with a PMhx of HTN listed below presents to the Emergency Department complaining of a right ankle injury that occurred yesterday. Pt states that she is unsure of what happened but may have injured it while playing with her children. Denies any falls. She currently rates her pain as a "8/10" and describes the pain as a "pressure". Pt denies fever, neck pain, sore throat, visual disturbance, CP, cough, SOB, abdominal pain, nausea, emesis, diarrhea, urinary symptoms, back pain, HA, weakness, numbness and rash as associated symptoms.     Past Medical History  Diagnosis Date  . Hypertension   . Kidney stones   . Placenta previa    Past Surgical History  Procedure Laterality Date  . Cesarean section    . Cesarean section N/A 06/18/2013    Procedure: CESAREAN SECTION;  Surgeon: Kathreen CosierBernard A Marshall, MD;  Location: [facility];  Service: Obstetrics;  Laterality: N/A;  . Cesarean section with bilateral tubal ligation Bilateral 06/18/2013    Procedure: REPEAT CESAREAN SECTION WITH BILATERAL TUBAL LIGATION;  Surgeon: Kathreen CosierBernard A Marshall, MD;  Location: [facility];  Service: Obstetrics;  Laterality: Bilateral;   Family History  Problem Relation Age of Onset  . Diabetes Maternal Grandfather   . Hypertension Maternal Grandfather    History  Substance Use Topics  . Smoking status: Current Some Day Smoker -- 0.25 packs/day for 14 years    Types: Cigarettes  . Smokeless tobacco: Former NeurosurgeonUser  . Alcohol Use: No   OB  History    Gravida Para Term Preterm AB TAB SAB Ectopic Multiple Living   3 3 2 1      3      Review of Systems  Constitutional: Negative for fever.  Musculoskeletal: Positive for arthralgias. Negative for myalgias and joint swelling.  Neurological: Negative for weakness and numbness.      Allergies  Review of patient's allergies indicates no known allergies.  Home Medications   Prior to Admission medications   Medication Sig Start Date End Date Taking? Authorizing Provider  amoxicillin (AMOXIL) 500 MG capsule Take 1 capsule (500 mg total) by mouth 3 (three) times daily. 03/01/14   Tatyana A Kirichenko, PA-C  benzocaine (HURRICAINE) 20 % SOLN Use as directed 1 application in the mouth or throat 3 (three) times daily as needed. As needed for sore throat 02/13/14   Dahlia ClientHannah Muthersbaugh, PA-C  fluticasone (FLONASE) 50 MCG/ACT nasal spray Place 2 sprays into both nostrils daily. 02/13/14   Hannah Muthersbaugh, PA-C  HYDROcodone-acetaminophen (NORCO) 5-325 MG per tablet Take 1-2 tablets by mouth every 6 (six) hours as needed for moderate pain. 03/01/14   Tatyana A Kirichenko, PA-C  naproxen sodium (ANAPROX) 220 MG tablet Take 220 mg by mouth 2 (two) times daily as needed (for pain).    Historical Provider, MD  oxyCODONE-acetaminophen (PERCOCET) 5-325 MG per tablet Take 1 tablet by mouth every 4 (four) hours as needed for severe pain. 12/25/13   Loren Raceravid Yelverton, MD   BP  105/72 mmHg  Pulse 91  Temp(Src) 98 F (36.7 C) (Oral)  Resp 18  SpO2 99%  Physical Exam  Constitutional: She is oriented to person, place, and time. She appears well-developed and well-nourished.  HENT:  Head: Normocephalic and atraumatic.  Mouth/Throat: Oropharynx is clear and moist.  Eyes: Conjunctivae and EOM are normal. Pupils are equal, round, and reactive to light.  Neck: Normal range of motion.  Cardiovascular: Normal rate, regular rhythm and normal heart sounds.   Pulmonary/Chest: Effort normal and breath sounds  normal. No respiratory distress. She has no wheezes.  Musculoskeletal: Normal range of motion.       Right ankle: She exhibits normal range of motion, no swelling, no ecchymosis, no deformity, no laceration and normal pulse. Tenderness. AITFL and posterior TFL tenderness found.       Feet:  Right ankle pain as depicted; no gross bony deformities, swelling, or visible signs of trauma; full range of motion maintained; moving all toes appropriately; foot remains neurovascularly intact  Neurological: She is alert and oriented to person, place, and time.  Skin: Skin is warm and dry.  Psychiatric: She has a normal mood and affect.  Nursing note and vitals reviewed.   ED Course  Procedures (including critical care time)  DIAGNOSTIC STUDIES: Oxygen Saturation is 99% on RA, normal by my interpretation.    COORDINATION OF CARE: 12:18 PM- Will order right ankle x-ray. Pt advised of plan for treatment and pt agrees.  Labs Review Labs Reviewed - No data to display  Imaging Review Dg Ankle Complete Right  05/21/2014   CLINICAL DATA:  29 year old female who tripped with lateral ankle pain. Initial encounter.  EXAM: RIGHT ANKLE - COMPLETE 3+ VIEW  COMPARISON:  None.  FINDINGS: No joint effusion identified. Preserved mortise joint alignment. Talar dome intact. Calcaneus intact. Chronic mild degenerative spurring at the anterior neck of the talus. Distal tibia and fibula appear intact with no acute fracture or dislocation identified.  IMPRESSION: No acute fracture or dislocation identified about the right ankle.   Electronically Signed   By: Augusto Gamble M.D.   On: 05/21/2014 12:54     EKG Interpretation None      MDM   Final diagnoses:  Right ankle pain   29 year old female with essentially atraumatic right ankle pain. Imaging was obtained which is negative for acute findings. Patient placed in ASO splint, ultracet for pain.  Encouraged RICE routine.  FU with orthopedics if no improvement in one  week.  Discussed plan with patient, he/she acknowledged understanding and agreed with plan of care.  Return precautions given for new or worsening symptoms.  I personally performed the services described in this documentation, which was scribed in my presence. The recorded information has been reviewed and is accurate.  Garlon Hatchet, PA-C 05/21/14 1325  Enid Skeens, MD 05/21/14 [phone]

## 2014-05-21 NOTE — ED Notes (Signed)
Pt from home with right ankle pain.  Denies injury to extremity.  Sts it feels like pressure.  No swelling or deformity to ankle.  Pt ambulatory in triage.  Pain 8/10.

## 2014-05-21 NOTE — Discharge Instructions (Signed)
Take the prescribed medication as directed for pain.  May also wish to ice and elevate your ankle to help with pain. Follow-up with orthopedics if no improvement in the next week. Return to the ED for new or worsening symptoms.

## 2014-08-18 ENCOUNTER — Emergency Department (HOSPITAL_COMMUNITY): Payer: Medicaid Other

## 2014-08-18 ENCOUNTER — Emergency Department (HOSPITAL_COMMUNITY)
Admission: EM | Admit: 2014-08-18 | Discharge: 2014-08-18 | Disposition: A | Discharge status: Home / Self Care | Payer: Medicaid Other | Attending: Emergency Medicine | Admitting: Emergency Medicine

## 2014-08-18 ENCOUNTER — Encounter (HOSPITAL_COMMUNITY): Payer: Self-pay | Admitting: Family Medicine

## 2014-08-18 DIAGNOSIS — R0789 Other chest pain: Secondary | ICD-10-CM | POA: Diagnosis not present

## 2014-08-18 DIAGNOSIS — Z720 Tobacco use: Secondary | ICD-10-CM | POA: Insufficient documentation

## 2014-08-18 DIAGNOSIS — I10 Essential (primary) hypertension: Secondary | ICD-10-CM | POA: Diagnosis not present

## 2014-08-18 DIAGNOSIS — Z792 Long term (current) use of antibiotics: Secondary | ICD-10-CM | POA: Diagnosis not present

## 2014-08-18 DIAGNOSIS — J069 Acute upper respiratory infection, unspecified: Secondary | ICD-10-CM | POA: Diagnosis not present

## 2014-08-18 DIAGNOSIS — R05 Cough: Secondary | ICD-10-CM

## 2014-08-18 DIAGNOSIS — Z7951 Long term (current) use of inhaled steroids: Secondary | ICD-10-CM | POA: Diagnosis not present

## 2014-08-18 DIAGNOSIS — B9789 Other viral agents as the cause of diseases classified elsewhere: Secondary | ICD-10-CM

## 2014-08-18 DIAGNOSIS — Z3202 Encounter for pregnancy test, result negative: Secondary | ICD-10-CM | POA: Insufficient documentation

## 2014-08-18 DIAGNOSIS — R197 Diarrhea, unspecified: Secondary | ICD-10-CM | POA: Diagnosis not present

## 2014-08-18 DIAGNOSIS — Z87442 Personal history of urinary calculi: Secondary | ICD-10-CM | POA: Insufficient documentation

## 2014-08-18 DIAGNOSIS — R059 Cough, unspecified: Secondary | ICD-10-CM

## 2014-08-18 DIAGNOSIS — R079 Chest pain, unspecified: Secondary | ICD-10-CM

## 2014-08-18 LAB — URINE MICROSCOPIC-ADD ON

## 2014-08-18 LAB — URINALYSIS, ROUTINE W REFLEX MICROSCOPIC
Glucose, UA: NEGATIVE mg/dL
Ketones, ur: NEGATIVE mg/dL
Nitrite: NEGATIVE
PH: 6 (ref 5.0–8.0)
Protein, ur: NEGATIVE mg/dL
Specific Gravity, Urine: 1.027 (ref 1.005–1.030)
Urobilinogen, UA: 1 mg/dL (ref 0.0–1.0)

## 2014-08-18 LAB — PREGNANCY, URINE: PREG TEST UR: NEGATIVE

## 2014-08-18 MED ORDER — HYDROCODONE-HOMATROPINE 5-1.5 MG/5ML PO SYRP: 5.0000 mL | ORAL_SOLUTION | Freq: Four times a day (QID) | ORAL | Status: DC | PRN

## 2014-08-18 MED ORDER — BENZONATATE 100 MG PO CAPS
200.0000 mg | ORAL_CAPSULE | Freq: Once | ORAL | Status: AC
  Administered 2014-08-18: 200 mg via ORAL
  Filled 2014-08-18: qty 2

## 2014-08-18 MED ORDER — AEROCHAMBER PLUS W/MASK MISC
1.0000 | Freq: Once | Status: AC
  Administered 2014-08-18: 1
  Filled 2014-08-18: qty 1

## 2014-08-18 MED ORDER — ALBUTEROL SULFATE HFA 108 (90 BASE) MCG/ACT IN AERS
2.0000 | INHALATION_SPRAY | RESPIRATORY_TRACT | Status: DC | PRN
  Administered 2014-08-18: 2 via RESPIRATORY_TRACT
  Filled 2014-08-18: qty 6.7

## 2014-08-18 MED ORDER — IBUPROFEN 800 MG PO TABS
800.0000 mg | ORAL_TABLET | Freq: Once | ORAL | Status: AC
  Administered 2014-08-18: 800 mg via ORAL
  Filled 2014-08-18: qty 1

## 2014-08-18 NOTE — Discharge Instructions (Signed)
1. Medications: hycodan, mucinex, albuterol, usual home medications 2. Treatment: rest, drink plenty of fluids, take tylenol or ibuprofen for fever control 3. Follow Up: Please followup with your primary doctor in 3 days for discussion of your diagnoses and further evaluation after today's visit; if you do not have a primary care doctor use the resource guide provided to find one; Return to the ER for high fevers, difficulty breathing or other concerning symptoms   Upper Respiratory Infection, Adult An upper respiratory infection (URI) is also sometimes known as the common cold. The upper respiratory tract includes the nose, sinuses, throat, trachea, and bronchi. Bronchi are the airways leading to the lungs. Most people improve within 1 week, but symptoms can last up to 2 weeks. A residual cough may last even longer.  CAUSES Many different viruses can infect the tissues lining the upper respiratory tract. The tissues become irritated and inflamed and often become very moist. Mucus production is also common. A cold is contagious. You can easily spread the virus to others by oral contact. This includes kissing, sharing a glass, coughing, or sneezing. Touching your mouth or nose and then touching a surface, which is then touched by another person, can also spread the virus. SYMPTOMS  Symptoms typically develop 1 to 3 days after you come in contact with a cold virus. Symptoms vary from person to person. They may include:  Runny nose.  Sneezing.  Nasal congestion.  Sinus irritation.  Sore throat.  Loss of voice (laryngitis).  Cough.  Fatigue.  Muscle aches.  Loss of appetite.  Headache.  Low-grade fever. DIAGNOSIS  You might diagnose your own cold based on familiar symptoms, since most people get a cold 2 to 3 times a year. Your caregiver can confirm this based on your exam. Most importantly, your caregiver can check that your symptoms are not due to another disease such as strep  throat, sinusitis, pneumonia, asthma, or epiglottitis. Blood tests, throat tests, and X-rays are not necessary to diagnose a common cold, but they may sometimes be helpful in excluding other more serious diseases. Your caregiver will decide if any further tests are required. RISKS AND COMPLICATIONS  You may be at risk for a more severe case of the common cold if you smoke cigarettes, have chronic heart disease (such as heart failure) or lung disease (such as asthma), or if you have a weakened immune system. The very young and very old are also at risk for more serious infections. Bacterial sinusitis, middle ear infections, and bacterial pneumonia can complicate the common cold. The common cold can worsen asthma and chronic obstructive pulmonary disease (COPD). Sometimes, these complications can require emergency medical care and may be life-threatening. PREVENTION  The best way to protect against getting a cold is to practice good hygiene. Avoid oral or hand contact with people with cold symptoms. Wash your hands often if contact occurs. There is no clear evidence that vitamin C, vitamin E, echinacea, or exercise reduces the chance of developing a cold. However, it is always recommended to get plenty of rest and practice good nutrition. TREATMENT  Treatment is directed at relieving symptoms. There is no cure. Antibiotics are not effective, because the infection is caused by a virus, not by bacteria. Treatment may include:  Increased fluid intake. Sports drinks offer valuable electrolytes, sugars, and fluids.  Breathing heated mist or steam (vaporizer or shower).  Eating chicken soup or other clear broths, and maintaining good nutrition.  Getting plenty of rest.  Using gargles  or lozenges for comfort.  Controlling fevers with ibuprofen or acetaminophen as directed by your caregiver.  Increasing usage of your inhaler if you have asthma. Zinc gel and zinc lozenges, taken in the first 24 hours of  the common cold, can shorten the duration and lessen the severity of symptoms. Pain medicines may help with fever, muscle aches, and throat pain. A variety of non-prescription medicines are available to treat congestion and runny nose. Your caregiver can make recommendations and may suggest nasal or lung inhalers for other symptoms.  HOME CARE INSTRUCTIONS   Only take over-the-counter or prescription medicines for pain, discomfort, or fever as directed by your caregiver.  Use a warm mist humidifier or inhale steam from a shower to increase air moisture. This may keep secretions moist and make it easier to breathe.  Drink enough water and fluids to keep your urine clear or pale yellow.  Rest as needed.  Return to work when your temperature has returned to normal or as your caregiver advises. You may need to stay home longer to avoid infecting others. You can also use a face mask and careful hand washing to prevent spread of the virus. SEEK MEDICAL CARE IF:   After the first few days, you feel you are getting worse rather than better.  You need your caregiver's advice about medicines to control symptoms.  You develop chills, worsening shortness of breath, or brown or red sputum. These may be signs of pneumonia.  You develop yellow or brown nasal discharge or pain in the face, especially when you bend forward. These may be signs of sinusitis.  You develop a fever, swollen neck glands, pain with swallowing, or white areas in the back of your throat. These may be signs of strep throat. SEEK IMMEDIATE MEDICAL CARE IF:   You have a fever.  You develop severe or persistent headache, ear pain, sinus pain, or chest pain.  You develop wheezing, a prolonged cough, cough up blood, or have a change in your usual mucus (if you have chronic lung disease).  You develop sore muscles or a stiff neck. Document Released: 10/05/2000 Document Revised: 07/04/2011 Document Reviewed: 07/17/2013 [facility]  Patient Information 2015 [place], [place]. This information is not intended to replace advice given to you by your health care provider. Make sure you discuss any questions you have with your health care provider.

## 2014-08-18 NOTE — ED Provider Notes (Signed)
CSN: [id number]     Arrival date & time 08/18/14  1139 History   First MD Initiated Contact with Patient 08/18/14 1459     Chief Complaint  Patient presents with  . Cough     (Consider location/radiation/quality/duration/timing/severity/associated sxs/prior Treatment) The history is provided by the patient and medical records. No language interpreter was used.     [name] Hendrix is a 29 y.o. female  with a hx of HTN, kidney stones presents to the Emergency Department complaining of gradual, persistent, progressively worsening cough, sore throat onset yesterday.   Associated symptoms include loose stool x1 without melena or hematochezia yesterday.  Pt also reports subjective fever, but did not measure it.  She states she has anterior and lateral rib pain with severe bouts of coughing.  She denies fever, chills, headache, neck pain, SOB, abd pain, N/V, weakness, dizziness, syncope.  Nothing makes it better and nothing makes it worse.  Pt reports she smokes 3-4 cigarettes per day.  Pt reports her 1yo child at home has pneumonia.     Past Medical History  Diagnosis Date  . Hypertension   . Kidney stones   . Placenta previa    Past Surgical History  Procedure Laterality Date  . Cesarean section    . Cesarean section N/A 06/18/2013    Procedure: CESAREAN SECTION;  Surgeon: [name], MD;  Location: [facility];  Service: Obstetrics;  Laterality: N/A;  . Cesarean section with bilateral tubal ligation Bilateral 06/18/2013    Procedure: REPEAT CESAREAN SECTION WITH BILATERAL TUBAL LIGATION;  Surgeon: [name], MD;  Location: [facility];  Service: Obstetrics;  Laterality: Bilateral;   Family History  Problem Relation Age of Onset  . Diabetes Maternal Grandfather   . Hypertension Maternal Grandfather    History  Substance Use Topics  . Smoking status: Current Some Day Smoker -- 0.25 packs/day for 14 years    Types: Cigarettes  . Smokeless tobacco: Former Neurosurgeon  . Alcohol Use: No    OB History    Gravida Para Term Preterm AB TAB SAB Ectopic Multiple Living   Review of Systems  Constitutional: Positive for fever ( subjective) and fatigue. Negative for chills and appetite change.  HENT: Positive for congestion, postnasal drip, rhinorrhea, sinus pressure and sore throat. Negative for ear discharge, ear pain and mouth sores.   Eyes: Negative for visual disturbance.  Respiratory: Positive for cough and chest tightness. Negative for shortness of breath, wheezing and stridor.   Cardiovascular: Negative for chest pain, palpitations and leg swelling.  Gastrointestinal: Positive for diarrhea (loose stool x2). Negative for nausea, vomiting and abdominal pain.  Genitourinary: Negative for dysuria, urgency, frequency and hematuria.  Musculoskeletal: Negative for myalgias, back pain, arthralgias and neck stiffness.  Skin: Negative for rash.  Neurological: Negative for syncope, light-headedness, numbness and headaches.  Hematological: Negative for adenopathy.  Psychiatric/Behavioral: The patient is not nervous/anxious.   All other systems reviewed and are negative.     Allergies  Review of patient's allergies indicates no known allergies.  Home Medications   Prior to Admission medications   Medication Sig Start Date End Date Taking? Authorizing Provider  amoxicillin (AMOXIL) 500 MG capsule Take 1 capsule (500 mg total) by mouth 3 (three) times daily. 03/01/14   Tatyana Kirichenko, PA-C  benzocaine (HURRICAINE) 20 % SOLN Use as directed 1 application in the mouth or throat 3 (three) times daily as needed. As  needed for sore throat 02/13/14   Dahlia Client Huberta Tompkins, PA-C  fluticasone (FLONASE) 50 MCG/ACT nasal spray Place 2 sprays into both nostrils daily. 02/13/14   Mostafa Yuan, PA-C  HYDROcodone-acetaminophen (NORCO) 5-325 MG per tablet Take 1-2 tablets by mouth every 6 (six) hours as needed for moderate pain. 03/01/14   Tatyana Kirichenko, PA-C   HYDROcodone-homatropine (HYCODAN) 5-1.5 MG/5ML syrup Take 5 mLs by mouth every 6 (six) hours as needed for cough. 08/18/14   Jahzara Slattery, PA-C  naproxen sodium (ANAPROX) 220 MG tablet Take 220 mg by mouth 2 (two) times daily as needed (for pain).    Historical Provider, MD  oxyCODONE-acetaminophen (PERCOCET) 5-325 MG per tablet Take 1 tablet by mouth every 4 (four) hours as needed for severe pain. 12/25/13   Loren Racer, MD  traMADol-acetaminophen (ULTRACET) 37.5-325 MG per tablet Take 1 tablet by mouth every 6 (six) hours as needed. 05/21/14   Garlon Hatchet, PA-C   BP 128/73 mmHg  Pulse 102  Temp(Src) 97.9 F (36.6 C) (Oral)  Resp 18  SpO2 100%  LMP 08/09/2014 Physical Exam  Constitutional: She is oriented to person, place, and time. She appears well-developed and well-nourished. No distress.  HENT:  Head: Normocephalic and atraumatic.  Right Ear: Tympanic membrane, external ear and ear canal normal.  Left Ear: Tympanic membrane, external ear and ear canal normal.  Nose: Mucosal edema and rhinorrhea present. No epistaxis. Right sinus exhibits no maxillary sinus tenderness and no frontal sinus tenderness. Left sinus exhibits no maxillary sinus tenderness and no frontal sinus tenderness.  Mouth/Throat: Uvula is midline and mucous membranes are normal. Mucous membranes are not pale and not cyanotic. Posterior oropharyngeal erythema present. No oropharyngeal exudate, posterior oropharyngeal edema or tonsillar abscesses.  Mild erythema of the posterior oropharyngeal mucosa without edema or exudate  Eyes: Conjunctivae are normal. Pupils are equal, round, and reactive to light.  Neck: Normal range of motion and full passive range of motion without pain.  Cardiovascular: Normal rate, normal heart sounds and intact distal pulses.   No murmur heard. Pulmonary/Chest: Effort normal. No stridor. She has decreased breath sounds. She has no wheezes. She has no rhonchi. She has no rales.  Clear  and equal but slightly decreased breath sounds throughout without focal wheezes, rhonchi, rales Pt coughing  Abdominal: Soft. Bowel sounds are normal. There is no tenderness.  Musculoskeletal: Normal range of motion.  Lymphadenopathy:    She has no cervical adenopathy.  Neurological: She is alert and oriented to person, place, and time.  Skin: Skin is warm and dry. No rash noted. She is not diaphoretic.  Psychiatric: She has a normal mood and affect.  Nursing note and vitals reviewed.   ED Course  Procedures (including critical care time) Labs Review Labs Reviewed  URINALYSIS, ROUTINE W REFLEX MICROSCOPIC - Abnormal; Notable for the following:    Color, Urine AMBER (*)    APPearance CLOUDY (*)    Hgb urine dipstick MODERATE (*)    Bilirubin Urine SMALL (*)    Leukocytes, UA SMALL (*)    All other components within normal limits  URINE MICROSCOPIC-ADD ON - Abnormal; Notable for the following:    Squamous Epithelial / LPF MANY (*)    Bacteria, UA MANY (*)    All other components within normal limits  URINE CULTURE  PREGNANCY, URINE    Imaging Review Dg Chest 2 View  08/18/2014   CLINICAL DATA:  Two days of productive cough and shortness of breath, current smoker.  EXAM:  CHEST  2 VIEW  COMPARISON:  PA and lateral chest x-ray of January 17, 2011  FINDINGS: The lungs are hyperinflated and clear. The heart and pulmonary vascularity are normal. The mediastinum is normal in width. There is no pleural effusion. The bony thorax is unremarkable.  IMPRESSION: COPD/reactive airway disease. There is no pneumonia nor other acute cardiopulmonary abnormality.   Electronically Signed   By: David  SwazilandJordan M.D.   On: 08/18/2014 13:32     EKG Interpretation None      MDM   Final diagnoses:  Cough  Viral URI with cough  Chest wall pain    [name] Hendrix presents with URI symptoms.  She is coughing on exam, but without focal breath sounds.  Pt CXR negative for acute infiltrate. Mild  COPD/reactive airway disease noted on x-ray and discussed with patient along with smoking cessation.  Will give albuterol MDI and reassess.    Patients symptoms are consistent with URI, likely viral etiology. She is afebrile in the department.  Discussed that antibiotics are not indicated for viral infections. Pt will be discharged with symptomatic treatment.  Verbalizes understanding and is agreeable with plan. Pt is hemodynamically stable & in NAD prior to dc.    BP 128/73 mmHg  Pulse 102  Temp(Src) 97.9 F (36.6 C) (Oral)  Resp 18  SpO2 100%  LMP 08/09/2014     Dierdre ForthHannah Bassy Fetterly, PA-C 08/18/14 1556  Doug SouSam Jacubowitz, MD 08/19/14 [phone]

## 2014-08-18 NOTE — ED Notes (Signed)
Pt having cough and diarrhea that started last night. sts some nausea and SOB

## 2014-08-19 LAB — URINE CULTURE: Colony Count: 30000

## 2014-08-27 ENCOUNTER — Encounter (HOSPITAL_COMMUNITY): Payer: Self-pay | Admitting: Emergency Medicine

## 2014-08-27 ENCOUNTER — Emergency Department (INDEPENDENT_AMBULATORY_CARE_PROVIDER_SITE_OTHER): Payer: Medicaid Other

## 2014-08-27 ENCOUNTER — Emergency Department (INDEPENDENT_AMBULATORY_CARE_PROVIDER_SITE_OTHER)
Admission: EM | Admit: 2014-08-27 | Discharge: 2014-08-27 | Disposition: A | Discharge status: Home / Self Care | Payer: Medicaid Other | Source: Home / Self Care | Attending: Family Medicine | Admitting: Family Medicine

## 2014-08-27 DIAGNOSIS — J209 Acute bronchitis, unspecified: Secondary | ICD-10-CM

## 2014-08-27 MED ORDER — PREDNISONE 20 MG PO TABS
ORAL_TABLET | ORAL | Status: AC
  Filled 2014-08-27: qty 3

## 2014-08-27 MED ORDER — ALBUTEROL SULFATE HFA 108 (90 BASE) MCG/ACT IN AERS: 1.0000 | INHALATION_SPRAY | Freq: Four times a day (QID) | RESPIRATORY_TRACT | Status: DC | PRN

## 2014-08-27 MED ORDER — ALBUTEROL SULFATE (2.5 MG/3ML) 0.083% IN NEBU
INHALATION_SOLUTION | RESPIRATORY_TRACT | Status: AC
  Filled 2014-08-27: qty 6

## 2014-08-27 MED ORDER — ALBUTEROL SULFATE (2.5 MG/3ML) 0.083% IN NEBU
5.0000 mg | INHALATION_SOLUTION | Freq: Once | RESPIRATORY_TRACT | Status: AC
  Administered 2014-08-27: 5 mg via RESPIRATORY_TRACT

## 2014-08-27 MED ORDER — PREDNISONE 10 MG PO TABS: ORAL_TABLET | ORAL | Status: DC

## 2014-08-27 MED ORDER — PREDNISONE 20 MG PO TABS
60.0000 mg | ORAL_TABLET | Freq: Once | ORAL | Status: AC
  Administered 2014-08-27: 60 mg via ORAL

## 2014-08-27 MED ORDER — IBUPROFEN 800 MG PO TABS
800.0000 mg | ORAL_TABLET | Freq: Once | ORAL | Status: AC
  Administered 2014-08-27: 800 mg via ORAL

## 2014-08-27 MED ORDER — IBUPROFEN 800 MG PO TABS
ORAL_TABLET | ORAL | Status: AC
  Filled 2014-08-27: qty 1

## 2014-08-27 MED ORDER — BENZONATATE 100 MG PO CAPS: 100.0000 mg | ORAL_CAPSULE | Freq: Three times a day (TID) | ORAL | Status: DC | PRN

## 2014-08-27 MED ORDER — IPRATROPIUM BROMIDE 0.02 % IN SOLN
0.5000 mg | Freq: Once | RESPIRATORY_TRACT | Status: AC
  Administered 2014-08-27: 0.5 mg via RESPIRATORY_TRACT

## 2014-08-27 MED ORDER — IPRATROPIUM BROMIDE 0.02 % IN SOLN
RESPIRATORY_TRACT | Status: AC
  Filled 2014-08-27: qty 2.5

## 2014-08-27 NOTE — ED Notes (Signed)
C/o bronchitis which was dx on 08/18/14 States coughing has her back and abd area hurting States she is sob

## 2014-08-27 NOTE — Discharge Instructions (Signed)

## 2014-08-27 NOTE — ED Provider Notes (Signed)
CSN: [id number]     Arrival date & time 08/27/14  1532 History   First MD Initiated Contact with Patient 08/27/14 1600     Chief Complaint  Patient presents with  . Bronchitis   (Consider location/radiation/quality/duration/timing/severity/associated sxs/prior Treatment) HPI  Past Medical History  Diagnosis Date  . Hypertension   . Kidney stones   . Placenta previa    Past Surgical History  Procedure Laterality Date  . Cesarean section    . Cesarean section N/A 06/18/2013    Procedure: CESAREAN SECTION;  Surgeon: Kathreen CosierBernard A Marshall, MD;  Location: [facility];  Service: Obstetrics;  Laterality: N/A;  . Cesarean section with bilateral tubal ligation Bilateral 06/18/2013    Procedure: REPEAT CESAREAN SECTION WITH BILATERAL TUBAL LIGATION;  Surgeon: Kathreen CosierBernard A Marshall, MD;  Location: [facility];  Service: Obstetrics;  Laterality: Bilateral;   Family History  Problem Relation Age of Onset  . Diabetes Maternal Grandfather   . Hypertension Maternal Grandfather    History  Substance Use Topics  . Smoking status: Current Some Day Smoker -- 0.25 packs/day for 14 years    Types: Cigarettes  . Smokeless tobacco: Former NeurosurgeonUser  . Alcohol Use: No   OB History    Gravida Para Term Preterm AB TAB SAB Ectopic Multiple Living   3 3 2 1      3      Review of Systems  Allergies  Hydromet  Home Medications   Prior to Admission medications   Medication Sig Start Date End Date Taking? Authorizing Provider  albuterol (PROVENTIL HFA;VENTOLIN HFA) 108 (90 BASE) MCG/ACT inhaler Inhale 1-2 puffs into the lungs every 6 (six) hours as needed for wheezing or shortness of breath. 08/27/14   Mathis FareJennifer Lee H Korrie Hofbauer, PA  amoxicillin (AMOXIL) 500 MG capsule Take 1 capsule (500 mg total) by mouth 3 (three) times daily. 03/01/14   Tatyana Kirichenko, PA-C  benzocaine (HURRICAINE) 20 % SOLN Use as directed 1 application in the mouth or throat 3 (three) times daily as needed. As needed for sore throat 02/13/14   Dahlia ClientHannah  Muthersbaugh, PA-C  benzonatate (TESSALON) 100 MG capsule Take 1 capsule (100 mg total) by mouth 3 (three) times daily as needed for cough. 08/27/14   Mathis FareJennifer Lee H Axiel Fjeld, PA  fluticasone (FLONASE) 50 MCG/ACT nasal spray Place 2 sprays into both nostrils daily. 02/13/14   Hannah Muthersbaugh, PA-C  HYDROcodone-acetaminophen (NORCO) 5-325 MG per tablet Take 1-2 tablets by mouth every 6 (six) hours as needed for moderate pain. 03/01/14   Tatyana Kirichenko, PA-C  naproxen sodium (ANAPROX) 220 MG tablet Take 220 mg by mouth 2 (two) times daily as needed (for pain).    Historical Provider, MD  oxyCODONE-acetaminophen (PERCOCET) 5-325 MG per tablet Take 1 tablet by mouth every 4 (four) hours as needed for severe pain. 12/25/13   Loren Raceravid Yelverton, MD  predniSONE (DELTASONE) 10 MG tablet Beginning 08/28/2014 take 5 tabs po QD day 1, 4 tabs po QD  Day 2, 3 tabs po QD day 3, 2 tabs po QD day 4, 1 tab po QD day 5 then stop 08/27/14   Ria ClockJennifer Lee H Pernell Dikes, PA  traMADol-acetaminophen (ULTRACET) 37.5-325 MG per tablet Take 1 tablet by mouth every 6 (six) hours as needed. 05/21/14   Garlon HatchetLisa M Sanders, PA-C   BP 125/81 mmHg  Pulse 113  Temp(Src) 98.4 F (36.9 C) (Oral)  Resp 18  SpO2 97%  LMP 08/06/2014 Physical Exam  ED Course  Procedures (including critical care time) Labs Review  Labs Reviewed - No data to display  Imaging Review Dg Chest 2 View  08/27/2014   CLINICAL DATA:  Cough and low-grade fevers for 2 weeks  EXAM: CHEST  2 VIEW  COMPARISON:  08/18/2014  FINDINGS: Cardiac shadow is within normal limits. The lungs are again hyperinflated but may be related to a vigorous inspiratory effort. No focal infiltrate or sizable effusion is seen. No bony abnormality is noted.  IMPRESSION: Hyperinflation which may be related to a vigorous inspiratory effort. No acute abnormality is noted.   Electronically Signed   By: Alcide CleverMark  Lukens M.D.   On: 08/27/2014 16:41     MDM   1. Bronchitis, acute, with bronchospasm    Patient given 800 mg Ibuprofen, 60 mg prednisone and albuterol 5mg  and atrovent 0.5mg  [facility] Endorses significant improvement following breathing treatment.  CXR grossly normal No hypoxia Will continue treatment at home with albuterol, prednisone taper and tessalon. Advised patient to stop smoking Follow up if no improvement    Ria ClockJennifer Lee H Lendon George, [place] 08/27/14 1750

## 2014-10-16 ENCOUNTER — Encounter (HOSPITAL_COMMUNITY): Payer: Self-pay | Admitting: Emergency Medicine

## 2014-10-16 ENCOUNTER — Emergency Department (INDEPENDENT_AMBULATORY_CARE_PROVIDER_SITE_OTHER)
Admission: EM | Admit: 2014-10-16 | Discharge: 2014-10-16 | Disposition: A | Discharge status: Home / Self Care | Payer: Medicaid Other | Source: Home / Self Care | Attending: Family Medicine | Admitting: Family Medicine

## 2014-10-16 DIAGNOSIS — K0510 Chronic gingivitis, plaque induced: Secondary | ICD-10-CM | POA: Diagnosis not present

## 2014-10-16 DIAGNOSIS — K0889 Other specified disorders of teeth and supporting structures: Secondary | ICD-10-CM

## 2014-10-16 DIAGNOSIS — K088 Other specified disorders of teeth and supporting structures: Secondary | ICD-10-CM | POA: Diagnosis not present

## 2014-10-16 DIAGNOSIS — Z9189 Other specified personal risk factors, not elsewhere classified: Secondary | ICD-10-CM

## 2014-10-16 MED ORDER — HYDROCODONE-ACETAMINOPHEN 5-325 MG PO TABS: 1.0000 | ORAL_TABLET | ORAL | Status: DC | PRN

## 2014-10-16 MED ORDER — AMOXICILLIN 500 MG PO CAPS: 1000.0000 mg | ORAL_CAPSULE | Freq: Two times a day (BID) | ORAL | Status: DC

## 2014-10-16 NOTE — Discharge Instructions (Signed)
Dental Pain °Toothache is pain in or around a tooth. It may get worse with chewing or with cold or heat.  °HOME CARE °· Your dentist may use a numbing medicine during treatment. If so, you may need to avoid eating until the medicine wears off. Ask your dentist about this. °· Only take medicine as told by your dentist or doctor. °· Avoid chewing food near the painful tooth until after all treatment is done. Ask your dentist about this. °GET HELP RIGHT AWAY IF:  °· The problem gets worse or new problems appear. °· You have a fever. °· There is redness and puffiness (swelling) of the face, jaw, or neck. °· You cannot open your mouth. °· There is pain in the jaw. °· There is very bad pain that is not helped by medicine. °MAKE SURE YOU:  °· Understand these instructions. °· Will watch your condition. °· Will get help right away if you are not doing well or get worse. °Document Released: 09/28/2007 Document Revised: 07/04/2011 Document Reviewed: 09/28/2007 °ExitCare® Patient Information ©2015 [employer]. This information is not intended to replace advice given to you by your health care provider. Make sure you discuss any questions you have with your health care provider. ° °Dental Care and Dentist Visits °Dental care supports good overall health. Regular dental visits can also help you avoid dental pain, bleeding, infection, and other more serious health problems in the future. It is important to keep the mouth healthy because diseases in the teeth, gums, and other oral tissues can spread to other areas of the body. Some problems, such as diabetes, heart disease, and pre-term labor have been associated with poor oral health.  °See your dentist every 6 months. If you experience emergency problems such as a toothache or broken tooth, go to the dentist right away. If you see your dentist regularly, you may catch problems early. It is easier to be treated for problems in the early stages.  °WHAT TO EXPECT AT A DENTIST  VISIT  °Your dentist will look for many common oral health problems and recommend proper treatment. At your regular dental visit, you can expect: °· Gentle cleaning of the teeth and gums. This includes scraping and polishing. This helps to remove the sticky substance around the teeth and gums (plaque). Plaque forms in the mouth shortly after eating. Over time, plaque hardens on the teeth as tartar. If tartar is not removed regularly, it can cause problems. Cleaning also helps remove stains. °· Periodic X-rays. These pictures of the teeth and supporting bone will help your dentist assess the health of your teeth. °· Periodic fluoride treatments. Fluoride is a natural mineral shown to help strengthen teeth. Fluoride treatment involves applying a fluoride gel or varnish to the teeth. It is most commonly done in children. °· Examination of the mouth, tongue, jaws, teeth, and gums to look for any oral health problems, such as: °¨ Cavities (dental caries). This is decay on the tooth caused by plaque, sugar, and acid in the mouth. It is best to catch a cavity when it is small. °¨ Inflammation of the gums caused by plaque buildup (gingivitis). °¨ Problems with the mouth or malformed or misaligned teeth. °¨ Oral cancer or other diseases of the soft tissues or jaws.  °KEEP YOUR TEETH AND GUMS HEALTHY °For healthy teeth and gums, follow these general guidelines as well as your dentist's specific advice: °· Have your teeth professionally cleaned at the dentist every 6 months. °· Brush twice daily with a   fluoride toothpaste. °· Floss your teeth daily.  °· Ask your dentist if you need fluoride supplements, treatments, or fluoride toothpaste. °· Eat a healthy diet. Reduce foods and drinks with added sugar. °· Avoid smoking. °TREATMENT FOR ORAL HEALTH PROBLEMS °If you have oral health problems, treatment varies depending on the conditions present in your teeth and gums. °· Your caregiver will most likely recommend good oral hygiene  at each visit. °· For cavities, gingivitis, or other oral health disease, your caregiver will perform a procedure to treat the problem. This is typically done at a separate appointment. Sometimes your caregiver will refer you to another dental specialist for specific tooth problems or for surgery. °SEEK IMMEDIATE DENTAL CARE IF: °· You have pain, bleeding, or soreness in the gum, tooth, jaw, or mouth area. °· A permanent tooth becomes loose or separated from the gum socket. °· You experience a blow or injury to the mouth or jaw area. °Document Released: 12/22/2010 Document Revised: 07/04/2011 Document Reviewed: 12/22/2010 °ExitCare® Patient Information ©2015 [employer]. This information is not intended to replace advice given to you by your health care provider. Make sure you discuss any questions you have with your health care provider. ° °

## 2014-10-16 NOTE — ED Provider Notes (Signed)
CSN: [id number]     Arrival date & time 10/16/14  1300 History   First MD Initiated Contact with Patient 10/16/14 1313     Chief Complaint  Patient presents with  . Dental Pain   (Consider location/radiation/quality/duration/timing/severity/associated sxs/prior Treatment) HPI Comments: 29 year old female complaining of 2 teeth that hurt for 2 days. She tried calling her dentist but was tired of waiting for them to make arrangements on the phone so she hung up and came here. She is pointing to the right upper second molar and left lower canine tooth as the sources of pain.   Past Medical History  Diagnosis Date  . Hypertension   . Kidney stones   . Placenta previa    Past Surgical History  Procedure Laterality Date  . Cesarean section    . Cesarean section N/A 06/18/2013    Procedure: CESAREAN SECTION;  Surgeon: [name], MD;  Location: [facility];  Service: Obstetrics;  Laterality: N/A;  . Cesarean section with bilateral tubal ligation Bilateral 06/18/2013    Procedure: REPEAT CESAREAN SECTION WITH BILATERAL TUBAL LIGATION;  Surgeon: [name], MD;  Location: [facility];  Service: Obstetrics;  Laterality: Bilateral;   Family History  Problem Relation Age of Onset  . Diabetes Maternal Grandfather   . Hypertension Maternal Grandfather    History  Substance Use Topics  . Smoking status: Current Some Day Smoker -- 0.25 packs/day for 14 years    Types: Cigarettes  . Smokeless tobacco: Former Neurosurgeon  . Alcohol Use: No   OB History    Gravida Para Term Preterm AB TAB SAB Ectopic Multiple Living   3 3 2 1      3      Review of Systems  Constitutional: Negative.  Negative for fever.  HENT: Positive for dental problem. Negative for congestion, facial swelling, mouth sores, postnasal drip and rhinorrhea.   Eyes: Negative.     Allergies  Hydromet  Home Medications   Prior to Admission medications   Medication Sig Start Date End Date Taking? Authorizing Provider   albuterol (PROVENTIL HFA;VENTOLIN HFA) 108 (90 BASE) MCG/ACT inhaler Inhale 1-2 puffs into the lungs every 6 (six) hours as needed for wheezing or shortness of breath. 08/27/14   Mathis Fare Presson, PA  amoxicillin (AMOXIL) 500 MG capsule Take 2 capsules (1,000 mg total) by mouth 2 (two) times daily. 10/16/14   Hayden Rasmussen, NP  benzocaine (HURRICAINE) 20 % SOLN Use as directed 1 application in the mouth or throat 3 (three) times daily as needed. As needed for sore throat 02/13/14   Dahlia Client Muthersbaugh, PA-C  HYDROcodone-acetaminophen (NORCO/VICODIN) 5-325 MG per tablet Take 1 tablet by mouth every 4 (four) hours as needed. 10/16/14   Hayden Rasmussen, NP  naproxen sodium (ANAPROX) 220 MG tablet Take 220 mg by mouth 2 (two) times daily as needed (for pain).    Historical Provider, MD   BP 110/66 mmHg  Pulse 106  Temp(Src) 98 F (36.7 C) (Oral)  Resp 16  SpO2 99%  LMP 10/03/2014 Physical Exam  Constitutional: She is oriented to person, place, and time. She appears well-developed and well-nourished. No distress.  HENT:  Mouth/Throat: Oropharynx is clear and moist.  Poor dental repair. Generalized gingivitis. Tenderness to the right upper second and third molars, and the left lower canine tooth which is growing toward the lingual aspect at an angle of approximately 20. No abscess is actually seen.  Neck: Normal range of motion. Neck supple.  Pulmonary/Chest: Effort normal.  Lymphadenopathy:    She has no cervical adenopathy.  Neurological: She is alert and oriented to person, place, and time. She exhibits normal muscle tone.  Skin: Skin is warm and dry.  Psychiatric: She has a normal mood and affect.  Nursing note and vitals reviewed.   ED Course  Procedures (including critical care time) Labs Review Labs Reviewed - No data to display  Imaging Review No results found.   MDM   1. Odontalgia   2. Poor dental hygiene   3. Gingivitis, chronic    Amoxicillin 500 mg 2 twice a day Norco  5 mg one every 4 hours when necessary pain #15   Note: There is an allergy pop up to hydrocodone and home atropine however the patient states she is not allergic to any medicines for pain or antibody.    Hayden Rasmussen, NP 10/16/14 1331

## 2014-10-16 NOTE — ED Notes (Signed)
Patient complains of toothache for 2 days.  Patient reports top, right tooth and bottom, left tooth is hurting today.  Reports having 8 teeth pulled in December 2015

## 2014-11-30 ENCOUNTER — Encounter (HOSPITAL_COMMUNITY): Payer: Self-pay | Admitting: *Deleted

## 2014-11-30 ENCOUNTER — Emergency Department (HOSPITAL_COMMUNITY)
Admission: EM | Admit: 2014-11-30 | Discharge: 2014-11-30 | Disposition: A | Discharge status: Home / Self Care | Payer: Medicaid Other | Attending: Emergency Medicine | Admitting: Emergency Medicine

## 2014-11-30 ENCOUNTER — Emergency Department (HOSPITAL_COMMUNITY): Payer: Medicaid Other

## 2014-11-30 DIAGNOSIS — I10 Essential (primary) hypertension: Secondary | ICD-10-CM | POA: Insufficient documentation

## 2014-11-30 DIAGNOSIS — Z87442 Personal history of urinary calculi: Secondary | ICD-10-CM | POA: Diagnosis not present

## 2014-11-30 DIAGNOSIS — M79661 Pain in right lower leg: Secondary | ICD-10-CM | POA: Insufficient documentation

## 2014-11-30 DIAGNOSIS — Z79899 Other long term (current) drug therapy: Secondary | ICD-10-CM | POA: Insufficient documentation

## 2014-11-30 DIAGNOSIS — M79662 Pain in left lower leg: Secondary | ICD-10-CM

## 2014-11-30 DIAGNOSIS — Z720 Tobacco use: Secondary | ICD-10-CM | POA: Insufficient documentation

## 2014-11-30 DIAGNOSIS — Z792 Long term (current) use of antibiotics: Secondary | ICD-10-CM | POA: Diagnosis not present

## 2014-11-30 MED ORDER — IBUPROFEN 600 MG PO TABS: 600.0000 mg | ORAL_TABLET | Freq: Four times a day (QID) | ORAL | Status: DC | PRN

## 2014-11-30 MED ORDER — TRAMADOL HCL 50 MG PO TABS: 50.0000 mg | ORAL_TABLET | Freq: Four times a day (QID) | ORAL | Status: DC | PRN

## 2014-11-30 MED ORDER — HYDROCODONE-ACETAMINOPHEN 5-325 MG PO TABS
1.0000 | ORAL_TABLET | Freq: Once | ORAL | Status: AC
  Administered 2014-11-30: 1 via ORAL
  Filled 2014-11-30: qty 1

## 2014-11-30 NOTE — ED Provider Notes (Signed)
CSN: [id number]     Arrival date & time 11/30/14  1225 History   First MD Initiated Contact with Patient 11/30/14 1302     Chief Complaint  Patient presents with  . Leg Pain     (Consider location/radiation/quality/duration/timing/severity/associated sxs/prior Treatment) HPI She is a 29 year old woman here for evaluation of bilateral leg pain. She states the pain is in her shins. It is aching. It started about 5 days ago and has gradually been worsening. She states it hurts all the time. It will get a little bit better if she can elevate her legs. She reports pain with walking as well as at rest. She has pain with ankle movements. She denies any pain in her knees, ankles, feet. No injury or trauma. She denies any change in her activity. She has tried some ibuprofen without improvement.  Past Medical History  Diagnosis Date  . Hypertension   . Kidney stones   . Placenta previa    Past Surgical History  Procedure Laterality Date  . Cesarean section    . Cesarean section N/A 06/18/2013    Procedure: CESAREAN SECTION;  Surgeon: [name], MD;  Location: [facility];  Service: Obstetrics;  Laterality: N/A;  . Cesarean section with bilateral tubal ligation Bilateral 06/18/2013    Procedure: REPEAT CESAREAN SECTION WITH BILATERAL TUBAL LIGATION;  Surgeon: [name], MD;  Location: [facility];  Service: Obstetrics;  Laterality: Bilateral;   Family History  Problem Relation Age of Onset  . Diabetes Maternal Grandfather   . Hypertension Maternal Grandfather    History  Substance Use Topics  . Smoking status: Current Some Day Smoker -- 0.25 packs/day for 14 years    Types: Cigarettes  . Smokeless tobacco: Former Neurosurgeon  . Alcohol Use: No   OB History    Gravida Para Term Preterm AB TAB SAB Ectopic Multiple Living   Review of Systems  As in history of present illness  Allergies  Hydromet  Home Medications   Prior to Admission medications   Medication  Sig Start Date End Date Taking? Authorizing Provider  albuterol (PROVENTIL HFA;VENTOLIN HFA) 108 (90 BASE) MCG/ACT inhaler Inhale 1-2 puffs into the lungs every 6 (six) hours as needed for wheezing or shortness of breath. 08/27/14   Mathis Fare Presson, PA  amoxicillin (AMOXIL) 500 MG capsule Take 2 capsules (1,000 mg total) by mouth 2 (two) times daily. 10/16/14   Hayden Rasmussen, NP  benzocaine (HURRICAINE) 20 % SOLN Use as directed 1 application in the mouth or throat 3 (three) times daily as needed. As needed for sore throat 02/13/14   Dahlia Client Muthersbaugh, PA-C  HYDROcodone-acetaminophen (NORCO/VICODIN) 5-325 MG per tablet Take 1 tablet by mouth every 4 (four) hours as needed. 10/16/14   Hayden Rasmussen, NP  ibuprofen (ADVIL,MOTRIN) 600 MG tablet Take 1 tablet (600 mg total) by mouth every 6 (six) hours as needed. 11/30/14   Charm Rings, MD  naproxen sodium (ANAPROX) 220 MG tablet Take 220 mg by mouth 2 (two) times daily as needed (for pain).    Historical Provider, MD  traMADol (ULTRAM) 50 MG tablet Take 1 tablet (50 mg total) by mouth every 6 (six) hours as needed. 11/30/14   Charm Rings, MD   BP 102/67 mmHg  Pulse 78  Temp(Src) 97.2 F (36.2 C) (Oral)  Resp 12  Ht 5' (1.524 m)  Wt 107 lb (48.535 kg)  BMI  20.90 kg/m2  SpO2 100%  LMP 11/24/2014  Breastfeeding? No Physical Exam  Constitutional: She is oriented to person, place, and time. She appears well-developed and well-nourished. No distress.  Cardiovascular: Normal rate.   Pulmonary/Chest: Effort normal.  Musculoskeletal:  Lower legs: No obvious swelling or erythema. She is tender to palpation along bilateral shins. 2+ DP pulses bilaterally.  Neurological: She is alert and oriented to person, place, and time.    ED Course  Procedures (including critical care time) Labs Review Labs Reviewed - No data to display  Imaging Review Dg Tibia/fibula Left  11/30/2014   CLINICAL DATA:  29 year old female with a history of bilateral leg pain/and  pain. No known trauma  EXAM: LEFT TIBIA AND FIBULA - 2 VIEW  COMPARISON:  None.  FINDINGS: No acute fracture identified. Cortex of the left tibia and fibula appears intact. No focal periosteal reaction identified. No radiopaque foreign body. No significant soft tissue swelling.  IMPRESSION: Negative for acute bony abnormality.  Signed,  Yvone Neu. Loreta Ave, DO  [facility]  [facility]   Electronically Signed   By: Gilmer Mor D.O.   On: 11/30/2014 15:08   Dg Tibia/fibula Right  11/30/2014   CLINICAL DATA:  Bilateral anterior lower leg pain for 4 days. Difficulty walking. No previous injury. Initial encounter.  EXAM: RIGHT TIBIA AND FIBULA - 2 VIEW  COMPARISON:  Ankle radiographs 05/21/2014.  FINDINGS: The mineralization and alignment are normal. There is no evidence of acute fracture or dislocation. The joint spaces are maintained. No focal soft tissue swelling or foreign body demonstrated.  IMPRESSION: Negative right lower leg radiographs.   Electronically Signed   By: Carey Bullocks M.D.   On: 11/30/2014 15:08     EKG Interpretation None      MDM   Final diagnoses:  Pain in shin, left  Pain in shin, right    Norco 5-325 milligrams given for pain.  Pain is improved after the Norco. X-rays negative. History is most consistent with shin splints. Recommended a well cushioned shoe, frequent icing, and ibuprofen. Prescription for tramadol given to use for severe pain. Expect improvement over several weeks.   Charm Rings, MD 11/30/14 [phone]

## 2014-11-30 NOTE — ED Notes (Signed)
Declined W/C at D/C and was escorted to lobby by RN. 

## 2014-11-30 NOTE — ED Notes (Signed)
PT reports Bil. Leg pain started on WED. the patient reports pain increased on SAT.

## 2014-11-30 NOTE — Discharge Instructions (Signed)
You likely have shin splints. Make sure you are wearing a well cushioned shoe. Apply ice to her shins as often as you can. Take ibuprofen 600 mg every 6 hours as needed. I would use this regularly for the next few days. Use the tramadol every 6-8 hours as needed for severe pain. This will gradually improve over the next several weeks.

## 2014-12-21 ENCOUNTER — Encounter (HOSPITAL_COMMUNITY): Payer: Self-pay | Admitting: Emergency Medicine

## 2014-12-21 ENCOUNTER — Emergency Department (HOSPITAL_COMMUNITY)
Admission: EM | Admit: 2014-12-21 | Discharge: 2014-12-21 | Disposition: A | Discharge status: Home / Self Care | Payer: Medicaid Other | Attending: Emergency Medicine | Admitting: Emergency Medicine

## 2014-12-21 ENCOUNTER — Emergency Department (HOSPITAL_COMMUNITY): Payer: Medicaid Other

## 2014-12-21 DIAGNOSIS — Y9289 Other specified places as the place of occurrence of the external cause: Secondary | ICD-10-CM | POA: Diagnosis not present

## 2014-12-21 DIAGNOSIS — I10 Essential (primary) hypertension: Secondary | ICD-10-CM | POA: Insufficient documentation

## 2014-12-21 DIAGNOSIS — S0101XA Laceration without foreign body of scalp, initial encounter: Secondary | ICD-10-CM | POA: Insufficient documentation

## 2014-12-21 DIAGNOSIS — Y998 Other external cause status: Secondary | ICD-10-CM | POA: Insufficient documentation

## 2014-12-21 DIAGNOSIS — S0191XA Laceration without foreign body of unspecified part of head, initial encounter: Secondary | ICD-10-CM

## 2014-12-21 DIAGNOSIS — Z79899 Other long term (current) drug therapy: Secondary | ICD-10-CM | POA: Insufficient documentation

## 2014-12-21 DIAGNOSIS — Z720 Tobacco use: Secondary | ICD-10-CM | POA: Diagnosis not present

## 2014-12-21 DIAGNOSIS — Y9389 Activity, other specified: Secondary | ICD-10-CM | POA: Insufficient documentation

## 2014-12-21 DIAGNOSIS — S0990XA Unspecified injury of head, initial encounter: Secondary | ICD-10-CM

## 2014-12-21 DIAGNOSIS — T7411XA Adult physical abuse, confirmed, initial encounter: Secondary | ICD-10-CM | POA: Insufficient documentation

## 2014-12-21 DIAGNOSIS — Z87442 Personal history of urinary calculi: Secondary | ICD-10-CM | POA: Insufficient documentation

## 2014-12-21 DIAGNOSIS — T7491XA Unspecified adult maltreatment, confirmed, initial encounter: Secondary | ICD-10-CM

## 2014-12-21 MED ORDER — TETANUS-DIPHTH-ACELL PERTUSSIS 5-2.5-18.5 LF-MCG/0.5 IM SUSP
0.5000 mL | Freq: Once | INTRAMUSCULAR | Status: AC
  Administered 2014-12-21: 0.5 mL via INTRAMUSCULAR
  Filled 2014-12-21: qty 0.5

## 2014-12-21 MED ORDER — TRAMADOL HCL 50 MG PO TABS
50.0000 mg | ORAL_TABLET | Freq: Once | ORAL | Status: AC
  Administered 2014-12-21: 50 mg via ORAL
  Filled 2014-12-21: qty 1

## 2014-12-21 MED ORDER — NAPROXEN 375 MG PO TABS: 375.0000 mg | ORAL_TABLET | Freq: Two times a day (BID) | ORAL | Status: DC

## 2014-12-21 MED ORDER — LIDOCAINE HCL 2 % IJ SOLN
15.0000 mL | Freq: Once | INTRAMUSCULAR | Status: AC
  Administered 2014-12-21: 300 mg
  Filled 2014-12-21: qty 20

## 2014-12-21 MED ORDER — LIDOCAINE-PRILOCAINE 2.5-2.5 % EX CREA
TOPICAL_CREAM | Freq: Once | CUTANEOUS | Status: AC
  Administered 2014-12-21: 13:00:00 via TOPICAL
  Filled 2014-12-21: qty 5

## 2014-12-21 NOTE — Discharge Instructions (Signed)
Facial Laceration  A facial laceration is a cut on the face. These injuries can be painful and cause bleeding. Lacerations usually heal quickly, but they need special care to reduce scarring. DIAGNOSIS  Your health care provider will take a medical history, ask for details about how the injury occurred, and examine the wound to determine how deep the cut is. TREATMENT  Some facial lacerations may not require closure. Others may not be able to be closed because of an increased risk of infection. The risk of infection and the chance for successful closure will depend on various factors, including the amount of time since the injury occurred. The wound may be cleaned to help prevent infection. If closure is appropriate, pain medicines may be given if needed. Your health care provider will use stitches (sutures), wound glue (adhesive), or skin adhesive strips to repair the laceration. These tools bring the skin edges together to allow for faster healing and a better cosmetic outcome. If needed, you may also be given a tetanus shot. HOME CARE INSTRUCTIONS  Only take over-the-counter or prescription medicines as directed by your health care provider.  Follow your health care provider's instructions for wound care. These instructions will vary depending on the technique used for closing the wound. For Sutures:  Keep the wound clean and dry.   If you were given a bandage (dressing), you should change it at least once a day. Also change the dressing if it becomes wet or dirty, or as directed by your health care provider.   Wash the wound with soap and water 2 times a day. Rinse the wound off with water to remove all soap. Pat the wound dry with a clean towel.   After cleaning, apply a thin layer of the antibiotic ointment recommended by your health care provider. This will help prevent infection and keep the dressing from sticking.   You may shower as usual after the first 24 hours. Do not soak  the wound in water until the sutures are removed.   Get your sutures removed as directed by your health care provider. With facial lacerations, sutures should usually be taken out after 4-5 days to avoid stitch marks.   Wait a few days after your sutures are removed before applying any makeup. For Skin Adhesive Strips:  Keep the wound clean and dry.   Do not get the skin adhesive strips wet. You may bathe carefully, using caution to keep the wound dry.   If the wound gets wet, pat it dry with a clean towel.   Skin adhesive strips will fall off on their own. You may trim the strips as the wound heals. Do not remove skin adhesive strips that are still stuck to the wound. They will fall off in time.  For Wound Adhesive:  You may briefly wet your wound in the shower or bath. Do not soak or scrub the wound. Do not swim. Avoid periods of heavy sweating until the skin adhesive has fallen off on its own. After showering or bathing, gently pat the wound dry with a clean towel.   Do not apply liquid medicine, cream medicine, ointment medicine, or makeup to your wound while the skin adhesive is in place. This may loosen the film before your wound is healed.   If a dressing is placed over the wound, be careful not to apply tape directly over the skin adhesive. This may cause the adhesive to be pulled off before the wound is healed.   Avoid  prolonged exposure to sunlight or tanning lamps while the skin adhesive is in place.  The skin adhesive will usually remain in place for 5-10 days, then naturally fall off the skin. Do not pick at the adhesive film.  After Healing: Once the wound has healed, cover the wound with sunscreen during the day for 1 full year. This can help minimize scarring. Exposure to ultraviolet light in the first year will darken the scar. It can take 1-2 years for the scar to lose its redness and to heal completely.  SEEK IMMEDIATE MEDICAL CARE IF:  You have redness,  pain, or swelling around the wound.   You see ayellowish-white fluid (pus) coming from the wound.   You have chills or a fever.  MAKE SURE YOU:  Understand these instructions.  Will watch your condition.  Will get help right away if you are not doing well or get worse. Document Released: 05/19/2004 Document Revised: 01/30/2013 Document Reviewed: 11/22/2012 [facility] Patient Information 2015 [place], [place]. This information is not intended to replace advice given to you by your health care provider. Make sure you discuss any questions you have with your health care provider.   RESOURCE GUIDE  Chronic Pain Problems: Contact [facility]  [phone] Patients need to be referred by their primary care doctor.  Insufficient Money for Medicine: Contact [employer]:  call "211" or [place] [phone].  No Primary Care Doctor: Call [facility]  [phone] - can help you locate a primary care doctor that  accepts your insurance, provides certain services, etc. Physician Referral Service- [phone]  Agencies that provide inexpensive medical care: [facility]  [facility]  [phone] [facility]  [phone] [facility]  [phone] [facility]  [phone] [facility]  [phone]  [place] Providers: [facility]- [address]  [phone], Mon-Fri 9am-7pm, Sat 9am-1pm [place], [address] [place]  [place], [address] [place]  [place]- [address]  [place], [address], [phone]  Only accepts [place] patients after they have their name  applied to their card  Self Pay (no insurance) in [facility]: Sickle Cell Patients: Dr Kevan Ny, [facility]  Medicine  [place], [facility]- [place]  [facility] [place]- [address], [facility]- see information above (Speak to [employer] if you do not have insurance)       -  [facility]- [place], [place],  [place]       -  [place], [place]  Dr Vista Lawman-  [address], [place], [place] [facility]- [address], [phone]       -  [facility]- [address], [place], also [address], [phone]       -    [facility]- [address] [place], 1st & 3rd Saturday   every month, 10am-1pm  1) Find a Doctor and Pay Out of Pocket Although you won't have to find out who is covered by your insurance plan, it is a good idea to ask around and get recommendations. You will then need to call the office and see if the doctor you have chosen will accept you as a new patient and what types of options they offer for patients who are self-pay. Some doctors offer discounts or will set up payment plans for their patients who do not have insurance, but you will need to ask so you aren't surprised when you get to your appointment.  2) Contact Your Local Health Department Not all health departments have doctors that can see patients for sick visits, but many do, so it is worth a call to see if yours does. If you don't know where your local health department is, you can check in your phone book. The CDC also has a tool to help you locate your state's health department, and many state websites also have listings of all of their local health departments.  3) Find a [facility] If your illness is not likely to be very severe or complicated, you may want to try a walk in clinic. These are popping up all over the  country in pharmacies, drugstores, and shopping centers. They're usually staffed by nurse practitioners or physician assistants that have been trained to treat common illnesses and complaints. They're usually fairly quick and inexpensive. However, if you have serious medical issues or chronic medical problems, these are probably not your best option  [facility] [place], [facility], [address], [place], phone [phone] or [phone].  Monday - Friday, call for an appointment. [place], [facility], [address], [place], phone [phone] or [phone].  Monday - Friday, call for an appointment.  Abuse/Neglect: [place] [phone] [place] [phone] (After Hours)  Emergency Shelter:  [facility] [phone]  Maternity Homes: Room at the [place] [phone] [place] [phone]  MRSA Hotline #:   650-431-5484  [facility] Dept. [address]         [place] Phone:  [phone]                                  Phone:  [phone]                   Phone:  [place], [phone]       -  [facility] in [place], [address]                                  [place] [phone] or [phone] (After Hours)   [place]  Substance Abuse Resources: Alcohol and Drug Services  [place]  [phone] [facility] [place] Chinita Pester [phone] Residential & Outpatient Substance Abuse Program  [phone]  Psychological Services: [place]  [phone] [place]  [place], [place] [address], [place], [place]: [phone] or [phone], [email or web address]  Dental Assistance  If unable to pay or uninsured, contact:  [facility] or [facility]. to become qualified for the adult dental clinic.  Patients with Medicaid: [facility] [phone] [address], [place] [address] [phone]  If unable to pay, or uninsured, contact [facility] [phone]) or [place] [phone] in [place], [place] in [facility]) to become qualified for the adult dental clinic  Other [place]- [place], [place], [place], [address], [place], [place], 2nd and 4th Thursday of the month at 6:30am.  10 clients each day by appointment, can sometimes see walk-in patients if someone does not show for an appointment. [facility]- [address] Hillard Danker [place], [place], [address], [place], [place], [place], [address], [place] [facility] [phone]  Please make every effort to establish with a primary care physician for routine medical care  [place]  The [place] provides a wide range of adult health services. Some of these services are designed to address the healthcare needs of all [facility] residents and all services are designed to meet the needs of uninsured/underinsured low income residents. Some services are available to any resident of [place], call  [phone] for details. ] The [facility], a new medical clinic for adults, is now open. For more information about the Center and its services please call [phone]. For information on our [place] services, click here.  For more information on any of the following Department of Public Health programs, including hours of service, click on the highlighted link.  SERVICES FOR WOMEN (Adults and Teens) [employer] provide a full range of birth control options plus education and counseling. New patient visit and annual return visits include a complete examination, pap test as indicated, and other laboratory as indicated. Included is our [employer] for men.  Maternity Care is provided through pregnancy, including a six week post partum exam. Women who meet eligibility criteria for the Medicaid for Pregnant Women program, receive care free. Other women are charged on a sliding scale according to income. Note: [facility] provides services to pregnant women who have a Medicaid card. Call [phone] for an appointment in [place] or [phone] for an appointment in [facility].  Primary Care for Medicaid [place] Access Women is available through the Index. As primary care provider for the [place] program, women may designate the [facility] clinic as their primary care provider.  PLEASE CALL [phone] FOR AN APPOINTMENT FOR THE ABOVE SERVICES IN EITHER [place]  OR [place]. Information available in [place] and [place].   Childbirth Education Classes are open to the public and offered to help families prepare for the best possible childbirth experience as well as to promote lifelong health and wellness. Classes are offered throughout the year and meet on the same night once a week for five weeks. Medicaid covers the cost of the classes for the  mother-to-be and her partner. For participants without Medicaid, the cost of the class series is $45.00 for the mother-to-be and her partner. Class size is limited and registration is required. For more information or to register call [phone]. Baby items donated by [employer] and the [employer] [place] are given away during each class series.  SERVICES FOR WOMEN AND MEN Sexually Transmitted Infection appointments, including HIV testing, are available daily (weekdays, except holidays). Call early as same-day appointments are limited. For an appointment in either [facility] or [place], call [phone]. Services are confidential and free of charge.  Skin Testing for Tuberculosis Please call [phone]. Adult Immunizations are available, usually for a fee. Please call [phone] for details.  PLEASE CALL [phone] FOR AN APPOINTMENT FOR THE ABOVE SERVICES IN EITHER [place] OR [place].   [facility] provides up to the minute recommended vaccines for your travel destination. We also provide essential health and political information to help insure a safe and pleasurable travel experience. This program is self-sustaining, however, fees are very competitive. We are a CERTIFIED YELLOW FEVER IMMUNIZATION approved clinic site. PLEASE CALL [phone] FOR AN APPOINTMENT IN EITHER [place] OR [place].   If you have questions about the services listed above, we want to answer them! Email [place] at: [email or web address] Home Visiting Services for elderly and the disabled are available to residents of [facility] who are in need of care that compares to the care offered by a nursing home, have needs that can be met by the program, and have [place] Medicaid. Other short term services are available to residents 18 years and older who are unable to meet requirements for eligibility to receive services from a certified home health agency, spend the majority of time at home, and need  care for six months or less.  PLEASE CALL [id number] OR [phone] FOR MORE INFORMATION. Medication Assistance Program serves as a link between pharmaceutical companies and patients to provide low cost or free prescription medications. This servce is available for residents who meet certain income restrictions and have no insurance coverage.  PLEASE CALL [phone] ([place]) OR [phone] ([place]) FOR MORE INFORMATION.  Updated Feb. 21, 2013
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
## 2014-12-21 NOTE — ED Notes (Signed)
Pt returns from radiology. 

## 2015-01-03 ENCOUNTER — Emergency Department (HOSPITAL_COMMUNITY)
Admission: EM | Admit: 2015-01-03 | Discharge: 2015-01-03 | Disposition: A | Discharge status: Home / Self Care | Payer: Medicaid Other | Attending: Emergency Medicine | Admitting: Emergency Medicine

## 2015-01-03 ENCOUNTER — Encounter (HOSPITAL_COMMUNITY): Payer: Self-pay | Admitting: Emergency Medicine

## 2015-01-03 DIAGNOSIS — R51 Headache: Secondary | ICD-10-CM | POA: Diagnosis not present

## 2015-01-03 DIAGNOSIS — Z87440 Personal history of urinary (tract) infections: Secondary | ICD-10-CM | POA: Insufficient documentation

## 2015-01-03 DIAGNOSIS — Z720 Tobacco use: Secondary | ICD-10-CM | POA: Diagnosis not present

## 2015-01-03 DIAGNOSIS — Z791 Long term (current) use of non-steroidal anti-inflammatories (NSAID): Secondary | ICD-10-CM | POA: Insufficient documentation

## 2015-01-03 DIAGNOSIS — I10 Essential (primary) hypertension: Secondary | ICD-10-CM | POA: Insufficient documentation

## 2015-01-03 DIAGNOSIS — Z4802 Encounter for removal of sutures: Secondary | ICD-10-CM

## 2015-01-03 NOTE — Discharge Instructions (Signed)
Read the information below.  You may return to the Emergency Department at any time for worsening condition or any new symptoms that concern you.  If you develop redness, swelling, pus draining from the wound, or fevers greater than 100.4, return to the ER immediately for a recheck.   ° ° °Suture Removal, Care After °Refer to this sheet in the next few weeks. These instructions provide you with information on caring for yourself after your procedure. Your health care provider may also give you more specific instructions. Your treatment has been planned according to current medical practices, but problems sometimes occur. Call your health care provider if you have any problems or questions after your procedure. °WHAT TO EXPECT AFTER THE PROCEDURE °After your stitches (sutures) are removed, it is typical to have the following: °· Some discomfort and swelling in the wound area. °· Slight redness in the area. °HOME CARE INSTRUCTIONS  °· If you have skin adhesive strips over the wound area, do not take the strips off. They will fall off on their own in a few days. If the strips remain in place after 14 days, you may remove them. °· Change any bandages (dressings) at least once a day or as directed by your health care provider. If the bandage sticks, soak it off with warm, soapy water. °· Apply cream or ointment only as directed by your health care provider. If using cream or ointment, wash the area with soap and water 2 times a day to remove all the cream or ointment. Rinse off the soap and pat the area dry with a clean towel. °· Keep the wound area dry and clean. If the bandage becomes wet or dirty, or if it develops a bad smell, change it as soon as possible. °· Continue to protect the wound from injury. °· Use sunscreen when out in the sun. New scars become sunburned easily. °SEEK MEDICAL CARE IF: °· You have increasing redness, swelling, or pain in the wound. °· You see pus coming from the wound. °· You have a  fever. °· You notice a bad smell coming from the wound or dressing. °· Your wound breaks open (edges not staying together). °Document Released: 01/04/2001 Document Revised: 01/30/2013 Document Reviewed: 11/21/2012 °[employer]® Patient Information ©2015 [employer]. This information is not intended to replace advice given to you by your health care provider. Make sure you discuss any questions you have with your health care provider. ° °
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
## 2015-01-31 ENCOUNTER — Encounter (HOSPITAL_COMMUNITY): Payer: Self-pay | Admitting: Emergency Medicine

## 2015-01-31 ENCOUNTER — Emergency Department (HOSPITAL_COMMUNITY)
Admission: EM | Admit: 2015-01-31 | Discharge: 2015-01-31 | Disposition: A | Discharge status: Home / Self Care | Payer: Medicaid Other | Attending: Emergency Medicine | Admitting: Emergency Medicine

## 2015-01-31 DIAGNOSIS — Z87442 Personal history of urinary calculi: Secondary | ICD-10-CM | POA: Insufficient documentation

## 2015-01-31 DIAGNOSIS — R112 Nausea with vomiting, unspecified: Secondary | ICD-10-CM | POA: Insufficient documentation

## 2015-01-31 DIAGNOSIS — I10 Essential (primary) hypertension: Secondary | ICD-10-CM | POA: Insufficient documentation

## 2015-01-31 DIAGNOSIS — Z720 Tobacco use: Secondary | ICD-10-CM | POA: Diagnosis not present

## 2015-01-31 DIAGNOSIS — Z3202 Encounter for pregnancy test, result negative: Secondary | ICD-10-CM | POA: Diagnosis not present

## 2015-01-31 LAB — CBC
HCT: 37.1 % (ref 36.0–46.0)
Hemoglobin: 12.6 g/dL (ref 12.0–15.0)
MCH: 31.1 pg (ref 26.0–34.0)
MCHC: 34 g/dL (ref 30.0–36.0)
MCV: 91.6 fL (ref 78.0–100.0)
PLATELETS: 360 10*3/uL (ref 150–400)
RBC: 4.05 MIL/uL (ref 3.87–5.11)
RDW: 12.8 % (ref 11.5–15.5)
WBC: 7.7 10*3/uL (ref 4.0–10.5)

## 2015-01-31 LAB — COMPREHENSIVE METABOLIC PANEL
ALK PHOS: 77 U/L (ref 38–126)
ALT: 24 U/L (ref 14–54)
AST: 23 U/L (ref 15–41)
Albumin: 3.9 g/dL (ref 3.5–5.0)
Anion gap: 8 (ref 5–15)
BUN: 9 mg/dL (ref 6–20)
CALCIUM: 9.4 mg/dL (ref 8.9–10.3)
CHLORIDE: 106 mmol/L (ref 101–111)
CO2: 26 mmol/L (ref 22–32)
CREATININE: 0.96 mg/dL (ref 0.44–1.00)
Glucose, Bld: 96 mg/dL (ref 65–99)
Potassium: 4.2 mmol/L (ref 3.5–5.1)
Sodium: 140 mmol/L (ref 135–145)
Total Bilirubin: 0.5 mg/dL (ref 0.3–1.2)
Total Protein: 7 g/dL (ref 6.5–8.1)

## 2015-01-31 LAB — POC URINE PREG, ED: Preg Test, Ur: NEGATIVE

## 2015-01-31 MED ORDER — SODIUM CHLORIDE 0.9 % IV BOLUS (SEPSIS): 1000.0000 mL | Freq: Once | INTRAVENOUS | Status: DC

## 2015-01-31 MED ORDER — ONDANSETRON HCL 4 MG/2ML IJ SOLN: 4.0000 mg | Freq: Once | INTRAMUSCULAR | Status: DC

## 2015-01-31 MED ORDER — METOCLOPRAMIDE HCL 5 MG/ML IJ SOLN
10.0000 mg | Freq: Once | INTRAMUSCULAR | Status: DC
Start: 2015-01-31 — End: 2015-01-31

## 2015-01-31 MED ORDER — METOCLOPRAMIDE HCL 10 MG PO TABS
10.0000 mg | ORAL_TABLET | Freq: Once | ORAL | Status: AC
  Administered 2015-01-31: 10 mg via ORAL
  Filled 2015-01-31: qty 1

## 2015-01-31 MED ORDER — DIPHENHYDRAMINE HCL 50 MG/ML IJ SOLN
25.0000 mg | Freq: Once | INTRAMUSCULAR | Status: DC
Start: 2015-01-31 — End: 2015-01-31

## 2015-01-31 MED ORDER — METOCLOPRAMIDE HCL 10 MG PO TABS: 10.0000 mg | ORAL_TABLET | Freq: Four times a day (QID) | ORAL | Status: DC | PRN

## 2015-01-31 MED ORDER — ONDANSETRON 4 MG PO TBDP
4.0000 mg | ORAL_TABLET | Freq: Once | ORAL | Status: AC
Start: 2015-01-31 — End: 2015-01-31
  Administered 2015-01-31: 4 mg via ORAL
  Filled 2015-01-31: qty 1

## 2015-01-31 NOTE — Discharge Instructions (Signed)
Nausea and Vomiting °Nausea is a sick feeling that often comes before throwing up (vomiting). Vomiting is a reflex where stomach contents come out of your mouth. Vomiting can cause severe loss of body fluids (dehydration). Children and elderly adults can become dehydrated quickly, especially if they also have diarrhea. Nausea and vomiting are symptoms of a condition or disease. It is important to find the cause of your symptoms. °CAUSES  °· Direct irritation of the stomach lining. This irritation can result from increased acid production (gastroesophageal reflux disease), infection, food poisoning, taking certain medicines (such as nonsteroidal anti-inflammatory drugs), alcohol use, or tobacco use. °· Signals from the brain. These signals could be caused by a headache, heat exposure, an inner ear disturbance, increased pressure in the brain from injury, infection, a tumor, or a concussion, pain, emotional stimulus, or metabolic problems. °· An obstruction in the gastrointestinal tract (bowel obstruction). °· Illnesses such as diabetes, hepatitis, gallbladder problems, appendicitis, kidney problems, cancer, sepsis, atypical symptoms of a heart attack, or eating disorders. °· Medical treatments such as chemotherapy and radiation. °· Receiving medicine that makes you sleep (general anesthetic) during surgery. °DIAGNOSIS °Your caregiver may ask for tests to be done if the problems do not improve after a few days. Tests may also be done if symptoms are severe or if the reason for the nausea and vomiting is not clear. Tests may include: °· Urine tests. °· Blood tests. °· Stool tests. °· Cultures (to look for evidence of infection). °· X-rays or other imaging studies. °Test results can help your caregiver make decisions about treatment or the need for additional tests. °TREATMENT °You need to stay well hydrated. Drink frequently but in small amounts. You may wish to drink water, sports drinks, clear broth, or eat frozen  ice pops or gelatin dessert to help stay hydrated. When you eat, eating slowly may help prevent nausea. There are also some antinausea medicines that may help prevent nausea. °HOME CARE INSTRUCTIONS  °· Take all medicine as directed by your caregiver. °· If you do not have an appetite, do not force yourself to eat. However, you must continue to drink fluids. °· If you have an appetite, eat a normal diet unless your caregiver tells you differently. °¨ Eat a variety of complex carbohydrates (rice, wheat, potatoes, bread), lean meats, yogurt, fruits, and vegetables. °¨ Avoid high-fat foods because they are more difficult to digest. °· Drink enough water and fluids to keep your urine clear or pale yellow. °· If you are dehydrated, ask your caregiver for specific rehydration instructions. Signs of dehydration may include: °¨ Severe thirst. °¨ Dry lips and mouth. °¨ Dizziness. °¨ Dark urine. °¨ Decreasing urine frequency and amount. °¨ Confusion. °¨ Rapid breathing or pulse. °SEEK IMMEDIATE MEDICAL CARE IF:  °· You have blood or brown flecks (like coffee grounds) in your vomit. °· You have black or bloody stools. °· You have a severe headache or stiff neck. °· You are confused. °· You have severe abdominal pain. °· You have chest pain or trouble breathing. °· You do not urinate at least once every 8 hours. °· You develop cold or clammy skin. °· You continue to vomit for longer than 24 to 48 hours. °· You have a fever. °MAKE SURE YOU:  °· Understand these instructions. °· Will watch your condition. °· Will get help right away if you are not doing well or get worse. °  °This information is not intended to replace advice given to you by your health care provider. Make sure   you discuss any questions you have with your health care provider.   Document Released: 04/11/2005 Document Revised: 07/04/2011 Document Reviewed: 09/08/2010 Elsevier Interactive Patient Education 2016 [employer].  Metoclopramide tablets What is  this medicine? METOCLOPRAMIDE (met oh kloe PRA mide) is used to treat the symptoms of gastroesophageal reflux disease (GERD) like heartburn. It is also used to treat people with slow emptying of the stomach and intestinal tract. This medicine may be used for other purposes; ask your health care provider or pharmacist if you have questions. What should I tell my health care provider before I take this medicine? They need to know if you have any of these conditions: -breast cancer -depression -diabetes -heart failure -high blood pressure -kidney disease -liver disease -Parkinson's disease or a movement disorder -pheochromocytoma -seizures -stomach obstruction, bleeding, or perforation -an unusual or allergic reaction to metoclopramide, procainamide, sulfites, other medicines, foods, dyes, or preservatives -pregnant or trying to get pregnant -breast-feeding How should I use this medicine? Take this medicine by mouth with a glass of water. Follow the directions on the prescription label. Take this medicine on an empty stomach, about 30 minutes before eating. Take your doses at regular intervals. Do not take your medicine more often than directed. Do not stop taking except on the advice of your doctor or health care professional. A special MedGuide will be given to you by the pharmacist with each prescription and refill. Be sure to read this information carefully each time. Talk to your pediatrician regarding the use of this medicine in children. Special care may be needed. Overdosage: If you think you have taken too much of this medicine contact a poison control center or emergency room at once. NOTE: This medicine is only for you. Do not share this medicine with others. What if I miss a dose? If you miss a dose, take it as soon as you can. If it is almost time for your next dose, take only that dose. Do not take double or extra doses. What may interact with this  medicine? -acetaminophen -cyclosporine -digoxin -medicines for blood pressure -medicines for diabetes, including insulin -medicines for hay fever and other allergies -medicines for depression, especially an Monoamine Oxidase Inhibitor (MAOI) -medicines for Parkinson's disease, like levodopa -medicines for sleep or for pain -tetracycline This list may not describe all possible interactions. Give your health care provider a list of all the medicines, herbs, non-prescription drugs, or dietary supplements you use. Also tell them if you smoke, drink alcohol, or use illegal drugs. Some items may interact with your medicine. What should I watch for while using this medicine? It may take a few weeks for your stomach condition to start to get better. However, do not take this medicine for longer than 12 weeks. The longer you take this medicine, and the more you take it, the greater your chances are of developing serious side effects. If you are an elderly patient, a female patient, or you have diabetes, you may be at an increased risk for side effects from this medicine. Contact your doctor immediately if you start having movements you cannot control such as lip smacking, rapid movements of the tongue, involuntary or uncontrollable movements of the eyes, head, arms and legs, or muscle twitches and spasms. Patients and their families should watch out for worsening depression or thoughts of suicide. Also watch out for any sudden or severe changes in feelings such as feeling anxious, agitated, panicky, irritable, hostile, aggressive, impulsive, severely restless, overly excited and hyperactive, or  not being able to sleep. If this happens, especially at the beginning of treatment or after a change in dose, call your doctor. Do not treat yourself for high fever. Ask your doctor or health care professional for advice. You may get drowsy or dizzy. Do not drive, use machinery, or do anything that needs mental  alertness until you know how this drug affects you. Do not stand or sit up quickly, especially if you are an older patient. This reduces the risk of dizzy or fainting spells. Alcohol can make you more drowsy and dizzy. Avoid alcoholic drinks. What side effects may I notice from receiving this medicine? Side effects that you should report to your doctor or health care professional as soon as possible: -allergic reactions like skin rash, itching or hives, swelling of the face, lips, or tongue -abnormal production of milk in females -breast enlargement in both males and females -change in the way you walk -difficulty moving, speaking or swallowing -drooling, lip smacking, or rapid movements of the tongue -excessive sweating -fever -involuntary or uncontrollable movements of the eyes, head, arms and legs -irregular heartbeat or palpitations -muscle twitches and spasms -unusually weak or tired Side effects that usually do not require medical attention (report to your doctor or health care professional if they continue or are bothersome): -change in sex drive or performance -depressed mood -diarrhea -difficulty sleeping -headache -menstrual changes -restless or nervous This list may not describe all possible side effects. Call your doctor for medical advice about side effects. You may report side effects to FDA at 1-800-FDA-1088. Where should I keep my medicine? Keep out of the reach of children. Store at room temperature between 20 and 25 degrees C (68 and 77 degrees F). Protect from light. Keep container tightly closed. Throw away any unused medicine after the expiration date. NOTE: This sheet is a summary. It may not cover all possible information. If you have questions about this medicine, talk to your doctor, pharmacist, or health care provider.    2016, Elsevier/Gold Standard. (2011-08-09 13:04:38)

## 2015-01-31 NOTE — ED Provider Notes (Signed)
CSN: [id number]     Arrival date & time 01/31/15  0216 History  By signing my name below, I, Doreatha Martin, attest that this documentation has been prepared under the direction and in the presence of Dione Booze, MD. Electronically Signed: Doreatha Martin, ED Scribe. 01/31/2015. 4:01 AM.  Chief Complaint  Patient presents with  . Emesis   The history is provided by the patient. No language interpreter was used.    HPI Comments: [name] Hendrix is a 29 y.o. female who presents to the Emergency Department complaining of moderate, intermittent emesis onset this evening with associated nausea, generalized myalgias, chills, diaphoresis. Sick contact with both children. She denies abdominal pain, diarrhea, known fever.   Past Medical History  Diagnosis Date  . Hypertension   . Kidney stones   . Placenta previa    Past Surgical History  Procedure Laterality Date  . Cesarean section    . Cesarean section N/A 06/18/2013    Procedure: CESAREAN SECTION;  Surgeon: [name], MD;  Location: [facility];  Service: Obstetrics;  Laterality: N/A;  . Cesarean section with bilateral tubal ligation Bilateral 06/18/2013    Procedure: REPEAT CESAREAN SECTION WITH BILATERAL TUBAL LIGATION;  Surgeon: [name], MD;  Location: [facility];  Service: Obstetrics;  Laterality: Bilateral;   Family History  Problem Relation Age of Onset  . Diabetes Maternal Grandfather   . Hypertension Maternal Grandfather    Social History  Substance Use Topics  . Smoking status: Current Some Day Smoker -- 0.25 packs/day for 14 years    Types: Cigarettes  . Smokeless tobacco: Former Neurosurgeon  . Alcohol Use: No   OB History    Gravida Para Term Preterm AB TAB SAB Ectopic Multiple Living   Review of Systems  Constitutional: Positive for diaphoresis. Negative for fever.  Gastrointestinal: Positive for nausea.  All other systems reviewed and are negative.  Allergies  Hydromet  Home Medications    Prior to Admission medications   Medication Sig Start Date End Date Taking? Authorizing Provider  HYDROcodone-acetaminophen (NORCO/VICODIN) 5-325 MG per tablet Take 1 tablet by mouth every 4 (four) hours as needed. 10/16/14  Yes Hayden Rasmussen, NP  albuterol (PROVENTIL HFA;VENTOLIN HFA) 108 (90 BASE) MCG/ACT inhaler Inhale 1-2 puffs into the lungs every 6 (six) hours as needed for wheezing or shortness of breath. Patient not taking: Reported on 01/31/2015 08/27/14   Mathis Fare Presson, PA  amoxicillin (AMOXIL) 500 MG capsule Take 2 capsules (1,000 mg total) by mouth 2 (two) times daily. Patient not taking: Reported on 12/21/2014 10/16/14   Hayden Rasmussen, NP  benzocaine (HURRICAINE) 20 % SOLN Use as directed 1 application in the mouth or throat 3 (three) times daily as needed. As needed for sore throat Patient not taking: Reported on 12/21/2014 02/13/14   Dahlia Client Muthersbaugh, PA-C  ibuprofen (ADVIL,MOTRIN) 600 MG tablet Take 1 tablet (600 mg total) by mouth every 6 (six) hours as needed. Patient not taking: Reported on 01/31/2015 11/30/14   Charm Rings, MD  naproxen (NAPROSYN) 375 MG tablet Take 1 tablet (375 mg total) by mouth 2 (two) times daily. Patient not taking: Reported on 01/31/2015 12/21/14   Marlon Pel, PA-C  traMADol (ULTRAM) 50 MG tablet Take 1 tablet (50 mg total) by mouth every 6 (six) hours as needed. Patient not taking: Reported on 01/31/2015 11/30/14   Charm Rings, MD   BP 105/57 mmHg  Pulse 102  Temp(Src) 99.7 F (37.6 C) (Oral)  Resp 18  SpO2 100%  LMP 12/23/2014 Physical Exam  Constitutional: She is oriented to person, place, and time. She appears well-developed and well-nourished.  HENT:  Head: Normocephalic and atraumatic.  Eyes: Conjunctivae and EOM are normal. Pupils are equal, round, and reactive to light.  Neck: Normal range of motion. Neck supple. No JVD present.  Cardiovascular: Normal rate, regular rhythm and normal heart sounds.   No murmur heard. Pulmonary/Chest:  Effort normal and breath sounds normal. She has no wheezes. She has no rales. She exhibits no tenderness.  Abdominal: Soft. She exhibits no distension and no mass. Bowel sounds are decreased. There is no tenderness.  Bowel sounds are decreased.   Musculoskeletal: Normal range of motion. She exhibits no edema.  Lymphadenopathy:    She has no cervical adenopathy.  Neurological: She is alert and oriented to person, place, and time. No cranial nerve deficit. She exhibits normal muscle tone. Coordination normal.  Skin: Skin is warm and dry. No rash noted.  Psychiatric: She has a normal mood and affect. Her behavior is normal. Judgment and thought content normal.  Nursing note and vitals reviewed.  ED Course  Procedures (including critical care time) DIAGNOSTIC STUDIES: Oxygen Saturation is 100% on RA, normal by my interpretation.    COORDINATION OF CARE: 4:00 AM Discussed treatment plan with pt at bedside and pt agreed to plan.   Labs Review Results for orders placed or performed during the hospital encounter of 01/31/15  Comprehensive metabolic panel  Result Value Ref Range   Sodium 140 135 - 145 mmol/L   Potassium 4.2 3.5 - 5.1 mmol/L   Chloride 106 101 - 111 mmol/L   CO2 26 22 - 32 mmol/L   Glucose, Bld 96 65 - 99 mg/dL   BUN 9 6 - 20 mg/dL   Creatinine, Ser 5.28 0.44 - 1.00 mg/dL   Calcium 9.4 8.9 - 41.3 mg/dL   Total Protein 7.0 6.5 - 8.1 g/dL   Albumin 3.9 3.5 - 5.0 g/dL   AST 23 15 - 41 U/L   ALT 24 14 - 54 U/L   Alkaline Phosphatase 77 38 - 126 U/L   Total Bilirubin 0.5 0.3 - 1.2 mg/dL   GFR calc non Af Amer >60 >60 mL/min   GFR calc Af Amer >60 >60 mL/min   Anion gap 8 5 - 15  CBC  Result Value Ref Range   WBC 7.7 4.0 - 10.5 K/uL   RBC 4.05 3.87 - 5.11 MIL/uL   Hemoglobin 12.6 12.0 - 15.0 g/dL   HCT 24.4 01.0 - 27.2 %   MCV 91.6 78.0 - 100.0 fL   MCH 31.1 26.0 - 34.0 pg   MCHC 34.0 30.0 - 36.0 g/dL   RDW 53.6 64.4 - 03.4 %   Platelets 360 150 - 400 K/uL  POC  urine preg, ED (not at [facility])  Result Value Ref Range   Preg Test, Ur NEGATIVE NEGATIVE   I have personally reviewed and evaluated these lab results as part of my medical decision-making.  MDM   Final diagnoses:  Non-intractable vomiting with nausea, unspecified vomiting type    Nausea and vomiting which appears to be viral gastritis with family contacts who had similar illness. She's not appear dehydrated. Laboratory workup shows normal BUN and creatinine. She is given ondansetron but still stated that her stomach didn't quite feel right. She is given a dose of metoclopramide and feels significant better.  She is discharged with prescription for metoclopramide.  I, Rosibel Giacobbe, personally performed the services described in this documentation. All medical record entries made by the scribe were at my direction and in my presence.  I have reviewed the chart and discharge instructions and agree that the record reflects my personal performance and is accurate and complete. Taletha Twiford.  01/31/2015. 8:17 AM.     Dione Booze, MD 01/31/15 [phone]

## 2015-01-31 NOTE — ED Notes (Signed)
Pt. reports nausea and vomitting onset last night , denies diarrhea , no fever or chills.

## 2015-05-26 ENCOUNTER — Encounter (HOSPITAL_COMMUNITY): Payer: Self-pay

## 2015-05-26 ENCOUNTER — Emergency Department (HOSPITAL_COMMUNITY)
Admission: EM | Admit: 2015-05-26 | Discharge: 2015-05-26 | Disposition: A | Discharge status: Home / Self Care | Payer: Medicaid Other | Attending: Emergency Medicine | Admitting: Emergency Medicine

## 2015-05-26 DIAGNOSIS — K0889 Other specified disorders of teeth and supporting structures: Secondary | ICD-10-CM | POA: Insufficient documentation

## 2015-05-26 DIAGNOSIS — F17210 Nicotine dependence, cigarettes, uncomplicated: Secondary | ICD-10-CM | POA: Insufficient documentation

## 2015-05-26 DIAGNOSIS — Z720 Tobacco use: Secondary | ICD-10-CM

## 2015-05-26 DIAGNOSIS — M542 Cervicalgia: Secondary | ICD-10-CM | POA: Insufficient documentation

## 2015-05-26 DIAGNOSIS — K047 Periapical abscess without sinus: Secondary | ICD-10-CM | POA: Diagnosis not present

## 2015-05-26 DIAGNOSIS — K029 Dental caries, unspecified: Secondary | ICD-10-CM

## 2015-05-26 DIAGNOSIS — I10 Essential (primary) hypertension: Secondary | ICD-10-CM | POA: Diagnosis not present

## 2015-05-26 DIAGNOSIS — Z87442 Personal history of urinary calculi: Secondary | ICD-10-CM | POA: Insufficient documentation

## 2015-05-26 DIAGNOSIS — H9201 Otalgia, right ear: Secondary | ICD-10-CM | POA: Insufficient documentation

## 2015-05-26 MED ORDER — PENICILLIN V POTASSIUM 500 MG PO TABS: 1000.0000 mg | ORAL_TABLET | Freq: Two times a day (BID) | ORAL | Status: DC

## 2015-05-26 MED ORDER — NAPROXEN 250 MG PO TABS
500.0000 mg | ORAL_TABLET | Freq: Once | ORAL | Status: AC
  Administered 2015-05-26: 500 mg via ORAL
  Filled 2015-05-26: qty 2

## 2015-05-26 MED ORDER — NAPROXEN 500 MG PO TABS: 500.0000 mg | ORAL_TABLET | Freq: Two times a day (BID) | ORAL | Status: DC | PRN

## 2015-05-26 NOTE — ED Provider Notes (Signed)
CSN: [id number]     Arrival date & time 05/26/15  1658 History  By signing my name below, I, [name] Hendrix, attest that this documentation has been prepared under the direction and in the presence of [name], PA-C. Electronically Signed: [name], ED Scribe. 05/26/2015. 5:36 PM.      Chief Complaint  Patient presents with  . Dental Pain   Patient is a 30 y.o. female presenting with tooth pain. The history is provided by the patient and medical records. No language interpreter was used.  Dental Pain Location:  Upper Upper teeth location:  2/RU 2nd molar Quality:  Throbbing Severity:  Moderate Onset quality:  Gradual Duration:  1 week Timing:  Constant Progression:  Worsening Chronicity:  Recurrent Context: dental caries   Context: not abscess and not dental fracture   Relieved by:  Acetaminophen, NSAIDs and heat Worsened by:  Cold food/drink and hot food/drink Ineffective treatments:  None tried Associated symptoms: neck pain (soreness from R molar)   Associated symptoms: no difficulty swallowing, no drooling, no facial swelling, no fever, no gum swelling, no neck swelling, no oral bleeding, no oral lesions and no trismus   Risk factors: lack of dental care and smoking   Risk factors: no immunosuppression    HPI Comments: [name] Hendrix is a 30 y.o. female smoker at 0.25 ppd with a PMHx of HTN, who presents to the Emergency Department complaining of constant, throbbing, 9/10 right upper dental pain radiating to the right ear onset 1 week ago, worse with cold exposure and chewing on that side, and mildly improved with warm compresses, BC powders, tylenol, and advil. She reports associated R-sided neck soreness due to the dental pain. She reports a PMHx of poor dentition and has not seen a dentist for quite some time. Pt denies ear drainage, gum swelling or drainage, swelling to the face or the neck, URI symptoms, neck stiffness, trismus, drooling, difficulty  swallowing, fever, CP, SOB, abdominal pain, n/v/d, constipation, dysuria, hematuria, numbness, tingling, and weakness. NKDA to any antibiotics.    Past Medical History  Diagnosis Date  . Hypertension   . Kidney stones   . Placenta previa    Past Surgical History  Procedure Laterality Date  . Cesarean section    . Cesarean section N/A 06/18/2013    Procedure: CESAREAN SECTION;  Surgeon: [name], MD;  Location: [facility];  Service: Obstetrics;  Laterality: N/A;  . Cesarean section with bilateral tubal ligation Bilateral 06/18/2013    Procedure: REPEAT CESAREAN SECTION WITH BILATERAL TUBAL LIGATION;  Surgeon: [name], MD;  Location: [facility];  Service: Obstetrics;  Laterality: Bilateral;   Family History  Problem Relation Age of Onset  . Diabetes Maternal Grandfather   . Hypertension Maternal Grandfather    Social History  Substance Use Topics  . Smoking status: Current Some Day Smoker -- 0.25 packs/day for 14 years    Types: Cigarettes  . Smokeless tobacco: Former Neurosurgeon  . Alcohol Use: No   OB History    Gravida Para Term Preterm AB TAB SAB Ectopic Multiple Living   Review of Systems  Constitutional: Negative for fever and chills.  HENT: Positive for dental problem and ear pain (referred from R molar). Negative for drooling, ear discharge, facial swelling, mouth sores, rhinorrhea, sore throat and trouble swallowing.   Respiratory: Negative for shortness of breath.   Cardiovascular: Negative for chest pain.  Gastrointestinal:  Negative for nausea, vomiting, abdominal pain, diarrhea and constipation.  Genitourinary: Negative for dysuria and hematuria.  Musculoskeletal: Positive for neck pain (soreness from R molar). Negative for myalgias, arthralgias and neck stiffness.  Skin: Negative for color change.  Allergic/Immunologic: Negative for immunocompromised state.  Neurological: Negative for weakness and numbness.   10 Systems reviewed and all  are negative for acute change except as noted in the HPI.   Allergies  Hydromet  Home Medications   Prior to Admission medications   Medication Sig Start Date End Date Taking? Authorizing Provider  HYDROcodone-acetaminophen (NORCO/VICODIN) 5-325 MG per tablet Take 1 tablet by mouth every 4 (four) hours as needed. 10/16/14   Hayden Rasmussen, NP  metoCLOPramide (REGLAN) 10 MG tablet Take 1 tablet (10 mg total) by mouth every 6 (six) hours as needed for nausea. 01/31/15   Dione Booze, MD  naproxen (NAPROSYN) 500 MG tablet Take 1 tablet (500 mg total) by mouth 2 (two) times daily as needed for mild pain, moderate pain or headache (TAKE WITH MEALS.). 05/26/15   [name], PA-C  penicillin v potassium (VEETID) 500 MG tablet Take 2 tablets (1,000 mg total) by mouth 2 (two) times daily. X 7 days 05/26/15   Mika Griffitts Camprubi-Soms, PA-C   BP 113/73 mmHg  Pulse 84  Temp(Src) 98.2 F (36.8 C) (Oral)  Resp 18  SpO2 100%  LMP  (Approximate) Physical Exam  Constitutional: She is oriented to person, place, and time. Vital signs are normal. She appears well-developed and well-nourished.  Non-toxic appearance. No distress.  Afebrile, nontoxic, NAD  HENT:  Head: Normocephalic and atraumatic.  Nose: Nose normal.  Mouth/Throat: Uvula is midline, oropharynx is clear and moist and mucous membranes are normal. No trismus in the jaw. Dental caries present. No dental abscesses or uvula swelling.    R upper molar #2 decayed with minimal gingival erythema, but no surrounding swelling or abscess, no evidence of ludwig's. Nose clear. Oropharynx clear and moist, without uvular swelling or deviation, no trismus or drooling, no tonsillar swelling or erythema, no exudates.    Eyes: Conjunctivae and EOM are normal. Right eye exhibits no discharge. Left eye exhibits no discharge.  Neck: Normal range of motion. Neck supple.  Cardiovascular: Normal rate.   Pulmonary/Chest: Effort normal. No respiratory distress.    Abdominal: Normal appearance. She exhibits no distension.  Musculoskeletal: Normal range of motion.  Neurological: She is alert and oriented to person, place, and time. She has normal strength. No sensory deficit.  Skin: Skin is warm, dry and intact. No rash noted.  Psychiatric: She has a normal mood and affect. Her behavior is normal.  Nursing note and vitals reviewed.   ED Course  Procedures  DIAGNOSTIC STUDIES: Oxygen Saturation is 100% on RA, normal by my interpretation.    COORDINATION OF CARE: 5:30 PM - Discussed plans to refer to a dentist will order non-narcotic medication for pain. Pt advised of plan for treatment and pt agrees.  Labs Review Labs Reviewed - No data to display  Imaging Review No results found. I have personally reviewed and evaluated these images and lab results as part of my medical decision-making.   EKG Interpretation None      MDM   Final diagnoses:  Pain due to dental caries  Dental decay  Tobacco use  Dental infection    30 y.o. female here with Dental pain associated with dental decay/caries and possible dental infection with patient afebrile, non toxic appearing and swallowing secretions well, no evidence of  abscess or ludwig's. I gave patient referral to dentist and stressed the importance of dental follow up for ultimate management of dental pain.  I have also discussed reasons to return immediately to the ER.  Patient expresses understanding and agrees with plan.  I will also give PCN VK and naprosyn. Discussed use of tylenol and heat as well. Smoking cessation advised.    I personally performed the services described in this documentation, which was scribed in my presence. The recorded information has been reviewed and is accurate.  BP 113/73 mmHg  Pulse 84  Temp(Src) 98.2 F (36.8 C) (Oral)  Resp 18  SpO2 100%  LMP  (Approximate)  Meds ordered this encounter  Medications  . naproxen (NAPROSYN) tablet 500 mg    Sig:   .  penicillin v potassium (VEETID) 500 MG tablet    Sig: Take 2 tablets (1,000 mg total) by mouth 2 (two) times daily. X 7 days    Dispense:  28 tablet    Refill:  0    Order Specific Question:  Supervising Provider    Answer:  MILLER, BRIAN [[id number]]  . naproxen (NAPROSYN) 500 MG tablet    Sig: Take 1 tablet (500 mg total) by mouth 2 (two) times daily as needed for mild pain, moderate pain or headache (TAKE WITH MEALS.).    Dispense:  20 tablet    Refill:  0    Order Specific Question:  Supervising Provider    Answer:  Eber Hong [[id number]]     Kalimah Capurro Camprubi-Soms, PA-C 05/26/15 1741  Gerhard Munch, MD 05/27/15 [phone]

## 2015-05-26 NOTE — ED Notes (Signed)
Pt stable, ambulatory, states understanding of discharge instructions 

## 2015-05-26 NOTE — ED Notes (Addendum)
Pt c/o dental pain x1 week, pain upper right tooth.

## 2015-05-26 NOTE — Discharge Instructions (Signed)
Apply warm compresses to jaw throughout the day. Take antibiotic until finished. Take naprosyn or tylenol as directed, as needed for pain. Followup with a dentist is very important for ongoing evaluation and management of recurrent dental pain. Use the list below to find a dentist. STOP SMOKING! Avoid sugary foods. Return to emergency department for emergent changing or worsening symptoms.   Dental Caries Dental caries is tooth decay. This decay can cause a hole in teeth (cavity) that can get bigger and deeper over time. HOME CARE  Brush and floss your teeth. Do this at least two times a day.  Use a fluoride toothpaste.  Use a mouth rinse if told by your dentist or doctor.  Eat less sugary and starchy foods. Drink less sugary drinks.  Avoid snacking often on sugary and starchy foods. Avoid sipping often on sugary drinks.  Keep regular checkups and cleanings with your dentist.  Use fluoride supplements if told by your dentist or doctor.  Allow fluoride to be applied to teeth if told by your dentist or doctor.   This information is not intended to replace advice given to you by your health care provider. Make sure you discuss any questions you have with your health care provider.   Document Released: 01/19/2008 Document Revised: 05/02/2014 Document Reviewed: 04/13/2012 Elsevier Interactive Patient Education 2016 Elsevier Inc.   Dental Pain Dental pain may be caused by many things, including:  Tooth decay (cavities or caries). Cavities cause the nerve of your tooth to be open to air and hot or cold temperatures. This can cause pain or discomfort.  Abscess or infection. A dental abscess is an area that is full of infected pus from a bacterial infection in the inner part of the tooth (pulp). It usually happens at the end of the tooth's root.  Injury.  An unknown reason (idiopathic). Your pain may be mild or severe. It may only happen when:  You are chewing.  You are exposed to  hot or cold temperature.  You are eating or drinking sugary foods or beverages, such as:  Soda.  Candy. Your pain may also be there all of the time. HOME CARE Watch your dental pain for any changes. Do these things to lessen your discomfort:  Take medicines only as told by your dentist.  If your dentist tells you to take an antibiotic medicine, finish all of it even if you start to feel better.  Keep all follow-up visits as told by your dentist. This is important.  Do not apply heat to the outside of your face.  Rinse your mouth or gargle with salt water if told by your dentist. This helps with pain and swelling.  You can make salt water by adding  tsp of salt to 1 cup of warm water.  Apply ice to the painful area of your face:  Put ice in a plastic bag.  Place a towel between your skin and the bag.  Leave the ice on for 20 minutes, 2-3 times per day.  Avoid foods or drinks that cause you pain, such as:  Very hot or very cold foods or drinks.  Sweet or sugary foods or drinks. GET HELP IF:  Your pain is not helped with medicines.  Your symptoms are worse.  You have new symptoms. GET HELP RIGHT AWAY IF:  You cannot open your mouth.  You are having trouble breathing or swallowing.  You have a fever.  Your face, neck, or jaw is puffy (swollen).  This information is not intended to replace advice given to you by your health care provider. Make sure you discuss any questions you have with your health care provider.   Document Released: 09/28/2007 Document Revised: 08/26/2014 Document Reviewed: 04/07/2014 Elsevier Interactive Patient Education 2016 [employer].  Smoking Cessation, Tips for Success If you are ready to quit smoking, congratulations! You have chosen to help yourself be healthier. Cigarettes bring nicotine, tar, carbon monoxide, and other irritants into your body. Your lungs, heart, and blood vessels will be able to work better without these  poisons. There are many different ways to quit smoking. Nicotine gum, nicotine patches, a nicotine inhaler, or nicotine nasal spray can help with physical craving. Hypnosis, support groups, and medicines help break the habit of smoking. WHAT THINGS CAN I DO TO MAKE QUITTING EASIER?  Here are some tips to help you quit for good:  Pick a date when you will quit smoking completely. Tell all of your friends and family about your plan to quit on that date.  Do not try to slowly cut down on the number of cigarettes you are smoking. Pick a quit date and quit smoking completely starting on that day.  Throw away all cigarettes.   Clean and remove all ashtrays from your home, work, and car.  On a card, write down your reasons for quitting. Carry the card with you and read it when you get the urge to smoke.  Cleanse your body of nicotine. Drink enough water and fluids to keep your urine clear or pale yellow. Do this after quitting to flush the nicotine from your body.  Learn to predict your moods. Do not let a bad situation be your excuse to have a cigarette. Some situations in your life might tempt you into wanting a cigarette.  Never have "just one" cigarette. It leads to wanting another and another. Remind yourself of your decision to quit.  Change habits associated with smoking. If you smoked while driving or when feeling stressed, try other activities to replace smoking. Stand up when drinking your coffee. Brush your teeth after eating. Sit in a different chair when you read the paper. Avoid alcohol while trying to quit, and try to drink fewer caffeinated beverages. Alcohol and caffeine may urge you to smoke.  Avoid foods and drinks that can trigger a desire to smoke, such as sugary or spicy foods and alcohol.  Ask people who smoke not to smoke around you.  Have something planned to do right after eating or having a cup of coffee. For example, plan to take a walk or exercise.  Try a relaxation  exercise to calm you down and decrease your stress. Remember, you may be tense and nervous for the first 2 weeks after you quit, but this will pass.  Find new activities to keep your hands busy. Play with a pen, coin, or rubber band. Doodle or draw things on paper.  Brush your teeth right after eating. This will help cut down on the craving for the taste of tobacco after meals. You can also try mouthwash.   Use oral substitutes in place of cigarettes. Try using lemon drops, carrots, cinnamon sticks, or chewing gum. Keep them handy so they are available when you have the urge to smoke.  When you have the urge to smoke, try deep breathing.  Designate your home as a nonsmoking area.  If you are a heavy smoker, ask your health care provider about a prescription for nicotine chewing gum.  It can ease your withdrawal from nicotine.  Reward yourself. Set aside the cigarette money you save and buy yourself something nice.  Look for support from others. Join a support group or smoking cessation program. Ask someone at home or at work to help you with your plan to quit smoking.  Always ask yourself, "Do I need this cigarette or is this just a reflex?" Tell yourself, "Today, I choose not to smoke," or "I do not want to smoke." You are reminding yourself of your decision to quit.  Do not replace cigarette smoking with electronic cigarettes (commonly called e-cigarettes). The safety of e-cigarettes is unknown, and some may contain harmful chemicals.  If you relapse, do not give up! Plan ahead and think about what you will do the next time you get the urge to smoke. HOW WILL I FEEL WHEN I QUIT SMOKING? You may have symptoms of withdrawal because your body is used to nicotine (the addictive substance in cigarettes). You may crave cigarettes, be irritable, feel very hungry, cough often, get headaches, or have difficulty concentrating. The withdrawal symptoms are only temporary. They are strongest when you  first quit but will go away within 10-14 days. When withdrawal symptoms occur, stay in control. Think about your reasons for quitting. Remind yourself that these are signs that your body is healing and getting used to being without cigarettes. Remember that withdrawal symptoms are easier to treat than the major diseases that smoking can cause.  Even after the withdrawal is over, expect periodic urges to smoke. However, these cravings are generally short lived and will go away whether you smoke or not. Do not smoke! WHAT RESOURCES ARE AVAILABLE TO HELP ME QUIT SMOKING? Your health care provider can direct you to community resources or hospitals for support, which may include:  Group support.  Education.  Hypnosis.  Therapy.   This information is not intended to replace advice given to you by your health care provider. Make sure you discuss any questions you have with your health care provider.   Document Released: 01/08/2004 Document Revised: 05/02/2014 Document Reviewed: 09/27/2012 Elsevier Interactive Patient Education 2016 [employer].   Emergency Department Resource Guide 1) Find a Doctor and Pay Out of Pocket Although you won't have to find out who is covered by your insurance plan, it is a good idea to ask around and get recommendations. You will then need to call the office and see if the doctor you have chosen will accept you as a new patient and what types of options they offer for patients who are self-pay. Some doctors offer discounts or will set up payment plans for their patients who do not have insurance, but you will need to ask so you aren't surprised when you get to your appointment.  2) Contact Your Local Health Department Not all health departments have doctors that can see patients for sick visits, but many do, so it is worth a call to see if yours does. If you don't know where your local health department is, you can check in your phone book. The CDC also has a tool to  help you locate your state's health department, and many state websites also have listings of all of their local health departments.  3) Find a Walk-in Clinic If your illness is not likely to be very severe or complicated, you may want to try a walk in clinic. These are popping up all over the country in pharmacies, drugstores, and shopping centers. They're usually staffed  by nurse practitioners or physician assistants that have been trained to treat common illnesses and complaints. They're usually fairly quick and inexpensive. However, if you have serious medical issues or chronic medical problems, these are probably not your best option.  No Primary Care Doctor: - Call Health Connect at  [phone] - they can help you locate a primary care doctor that  accepts your insurance, provides certain services, etc. - Physician Referral Service- [phone]  Chronic Pain Problems: Organization         Address  Phone   Notes  [facility]  [phone] Patients need to be referred by their primary care doctor.   Medication Assistance: Organization         Address  Phone   Notes  [facility] Medication [facility] [address] [place] [place], [place] [address] [phone] --Must be a resident of [facility] -- Must have NO insurance coverage whatsoever (no Medicaid/ Medicare, etc.) -- The pt. MUST have a primary care doctor that directs their care regularly and follows them in the community   [facility]  [phone]   [place]  [phone]     Dental Care: Organization         Address  Phone  Notes  [facility] Department of [facility] [address] [place], [place] [phone] Accepts children up to age 36 who are enrolled in [place] or [place] Health Choice; pregnant women with a Medicaid card; and children who have applied for Medicaid or [place] Health Choice, but were declined, whose parents can pay a reduced fee  at time of service.  [facility] Department of [facility]  [address], [place] [phone] Accepts children up to age 37 who are enrolled in [place] or [place] Health Choice; pregnant women with a Medicaid card; and children who have applied for Medicaid or [place] Health Choice, but were declined, whose parents can pay a reduced fee at time of service.  [facility]  [address] [place], [place] [phone] Patients are seen by appointment only. Walk-ins are not accepted. [facility] will see patients 56 years of age and older. Monday - Tuesday (8am-5pm) Most Wednesdays (8:30-5pm) $30 per visit, cash only  [facility] Adult Dental Access PRO[facility]  [address], [facility] [phone] Patients are seen by appointment only. Walk-ins are not accepted. [facility] will see patients 74 years of age and older. One Wednesday Evening (Monthly: Volunteer Based).  $30 per visit, cash only  [employer] [facility] [employer]  [phone] for adults; Children under age 85, call Graduate Pediatric Dentistry at [phone]. Children aged 29-14, please call [phone] to request a pediatric application.  Dental services are provided in all areas of dental care including fillings, crowns and bridges, complete and partial dentures, implants, gum treatment, root canals, and extractions. Preventive care is also provided. Treatment is provided to both adults and children. Patients are selected via a lottery and there is often a waiting list.   [facility] [address] [place]  [phone] [email or web address]   [facility] [address] [place], [place] [phone], Ext. [phone] Second and Fourth Thursday of each month, opens at 6:30 AM; Clinic ends at 9 AM.  Patients are seen on a first-come first-served basis, and a limited number are seen during each clinic.   [facility]  [address],  [place], [place] [phone]   [address]You must have lived in [place], [place], or [place] counties for at least the last three months.   You cannot be eligible for state or federal sponsored [place], including [employer], [place], or [employer].   You generally cannot be eligible for healthcare insurance through your employer.    How to apply: Eligibility screenings are held every Tuesday and Wednesday afternoon from 1:00 pm until 4:00 pm. You do not need an appointment for the interview!  [facility] [address], [place], [place] [phone]   [facility] Health Department  [phone]   [facility] Health Department  [phone]   [facility] Health Department  [phone]

## 2016-01-01 ENCOUNTER — Encounter (HOSPITAL_COMMUNITY): Payer: Self-pay | Admitting: *Deleted

## 2016-01-01 ENCOUNTER — Emergency Department (HOSPITAL_COMMUNITY)
Admission: EM | Admit: 2016-01-01 | Discharge: 2016-01-01 | Disposition: A | Discharge status: Home / Self Care | Payer: Medicaid Other | Attending: Emergency Medicine | Admitting: Emergency Medicine

## 2016-01-01 DIAGNOSIS — Z7729 Contact with and (suspected ) exposure to other hazardous substances: Secondary | ICD-10-CM

## 2016-01-01 DIAGNOSIS — F17210 Nicotine dependence, cigarettes, uncomplicated: Secondary | ICD-10-CM | POA: Insufficient documentation

## 2016-01-01 DIAGNOSIS — T5891XA Toxic effect of carbon monoxide from unspecified source, accidental (unintentional), initial encounter: Secondary | ICD-10-CM | POA: Insufficient documentation

## 2016-01-01 DIAGNOSIS — I10 Essential (primary) hypertension: Secondary | ICD-10-CM | POA: Insufficient documentation

## 2016-01-01 LAB — CARBOXYHEMOGLOBIN
CARBOXYHEMOGLOBIN: 2.2 % — AB (ref 0.5–1.5)
Methemoglobin: 1.1 % (ref 0.0–1.5)
O2 Saturation: 49 %
Total hemoglobin: 12.5 g/dL (ref 12.0–16.0)

## 2016-01-01 NOTE — ED Provider Notes (Signed)
[facility]-EMERGENCY DEPT Provider Note   CSN: [id number] Arrival date & time: 01/01/16  1020     History   Chief Complaint No chief complaint on file.   HPI [name] Hendrix is a 30 y.o. female.  Patient presents with complaint of carbon monoxide exposure. Patient was sleeping upstairs and awoke approximately 8 AM today. She went downstairs and heard the stove making a "hissing" noise and smelled natural gas. Patient immediately called 9-1-1 who instructed her and her family to leave the house immediately. Patient thinks that she was out of the house by 8:07 AM with her 3 children. Patient complains currently of a mild headache and feeling a little foggy-headed. No shortness of breath or chest pain. No nausea or vomiting. No lightheadedness or syncope. No other medical complaints. No treatments prior to arrival. EMS was called and evaluated everyone on scene. Mother transported family to hospital. House is currently being evaluated by authorities. The onset of this condition was acute. The course is constant. Aggravating factors: none. Alleviating factors: none.        Past Medical History:  Diagnosis Date  . Hypertension   . Kidney stones   . Placenta previa     Patient Active Problem List   Diagnosis Date Noted  . S/P cesarean section 06/18/2013  . Placenta previa antepartum in third trimester 05/03/2013  . Placenta previa 04/19/2013  . Vaginal bleeding in pregnancy 03/25/2013    Past Surgical History:  Procedure Laterality Date  . CESAREAN SECTION    . CESAREAN SECTION N/A 06/18/2013   Procedure: CESAREAN SECTION;  Surgeon: [name], MD;  Location: [facility];  Service: Obstetrics;  Laterality: N/A;  . CESAREAN SECTION WITH BILATERAL TUBAL LIGATION Bilateral 06/18/2013   Procedure: REPEAT CESAREAN SECTION WITH BILATERAL TUBAL LIGATION;  Surgeon: [name], MD;  Location: [facility];  Service: Obstetrics;  Laterality: Bilateral;    OB History    Gravida Para Term  Preterm AB Living   3 3 2 1   3    SAB TAB Ectopic Multiple Live Births           3       Home Medications    Prior to Admission medications   Medication Sig Start Date End Date Taking? Authorizing Provider  HYDROcodone-acetaminophen (NORCO/VICODIN) 5-325 MG per tablet Take 1 tablet by mouth every 4 (four) hours as needed. 10/16/14   Hayden Rasmussen, NP  metoCLOPramide (REGLAN) 10 MG tablet Take 1 tablet (10 mg total) by mouth every 6 (six) hours as needed for nausea. 01/31/15   Dione Booze, MD  naproxen (NAPROSYN) 500 MG tablet Take 1 tablet (500 mg total) by mouth 2 (two) times daily as needed for mild pain, moderate pain or headache (TAKE WITH MEALS.). 05/26/15   Mercedes Camprubi-Soms, PA-C  penicillin v potassium (VEETID) 500 MG tablet Take 2 tablets (1,000 mg total) by mouth 2 (two) times daily. X 7 days 05/26/15   Mercedes Camprubi-Soms, PA-C    Family History Family History  Problem Relation Age of Onset  . Diabetes Maternal Grandfather   . Hypertension Maternal Grandfather     Social History Social History  Substance Use Topics  . Smoking status: Current Some Day Smoker    Packs/day: 0.25    Years: 14.00    Types: Cigarettes  . Smokeless tobacco: Former Neurosurgeon  . Alcohol use No     Allergies   Hydromet [hydrocodone-homatropine]   Review of Systems Review of Systems  Constitutional: Negative for fever.  HENT: Negative for rhinorrhea and sore throat.   Eyes: Negative for redness.  Respiratory: Negative for cough, shortness of breath and wheezing.   Cardiovascular: Negative for chest pain.  Gastrointestinal: Negative for abdominal pain, diarrhea, nausea and vomiting.  Genitourinary: Negative for dysuria.  Musculoskeletal: Negative for myalgias.  Skin: Negative for rash.  Neurological: Positive for dizziness and headaches.     Physical Exam Updated Vital Signs BP 112/74 (BP Location: Left Arm)   Pulse 104   Temp 100.1 F (37.8 C) (Temporal)   Resp 16   Wt 45.7  kg   SpO2 96%   BMI 19.69 kg/m   Physical Exam  Constitutional: She appears well-developed and well-nourished.  HENT:  Head: Normocephalic and atraumatic.  Mouth/Throat: Oropharynx is clear and moist.  Eyes: Conjunctivae are normal. Right eye exhibits no discharge. Left eye exhibits no discharge.  Neck: Normal range of motion. Neck supple.  Cardiovascular: Normal rate, regular rhythm and normal heart sounds.   Pulmonary/Chest: Effort normal and breath sounds normal.  Abdominal: Soft. There is no tenderness.  Neurological: She is alert.  Skin: Skin is warm and dry. No erythema. No pallor.  Psychiatric: She has a normal mood and affect.  Nursing note and vitals reviewed.    ED Treatments / Results  Labs (all labs ordered are listed, but only abnormal results are displayed) Labs Reviewed  CARBOXYHEMOGLOBIN - Abnormal; Notable for the following:       Result Value   Carboxyhemoglobin 2.2 (*)    All other components within normal limits    EKG  EKG Interpretation None       Radiology No results found.  Procedures Procedures (including critical care time)  Medications Ordered in ED Medications - No data to display   Initial Impression / Assessment and Plan / ED Course  I have reviewed the triage vital signs and the nursing notes.  Pertinent labs & imaging results that were available during my care of the patient were reviewed by me and considered in my medical decision making (see chart for details).  Clinical Course   Patient seen and examined. Work-up initiated. Mild symptoms, but will check spot carboxyhemoglobin.   Vital signs reviewed and are as follows: BP 112/74 (BP Location: Left Arm)   Pulse 104   Temp 100.1 F (37.8 C) (Temporal)   Resp 16   Wt 45.7 kg   SpO2 96%   BMI 19.69 kg/m    11:39 AM Minimally elevated carboxyhemoglobin. Mother is doing well. Feels safe for discharge to home at this time. Discussed not to reinjure home and advised by  utilities that it is safe.   Final Clinical Impressions(s) / ED Diagnoses   Final diagnoses:  Carbon monoxide exposure   Patient with minor symptoms of carbon monoxide poisoning. Carboxyhemoglobin minimally elevated. Feel safe for discharge.   New Prescriptions New Prescriptions   No medications on file     Renne CriglerJoshua Vannessa Godown, PA-C 01/01/16 1140    Blane OharaJoshua Zavitz, MD 01/01/16 [phone]

## 2016-01-01 NOTE — Discharge Instructions (Signed)
Please read and follow all provided instructions.  Your diagnoses today include:  1. Carbon monoxide exposure     Tests performed today include:  Vital signs. See below for your results today.   Carbon monoxide level - slightly high but not dangerously high  Medications prescribed:   None  Home care instructions:  Follow any educational materials contained in this packet.  Ensure that your home is safe to re-enter and follow any Instructions given to you by the utilities or emergency medical services.  Follow-up instructions: Return with worsening trouble breathing, headache, altered mental status.   Return instructions:   Please return to the Emergency Department if you experience worsening symptoms.   Please return if you have any other emergent concerns.  Additional Information:  Your vital signs today were: BP 112/74 (BP Location: Left Arm)    Pulse 104    Temp 100.1 F (37.8 C) (Temporal)    Resp 16    Wt 45.7 kg    SpO2 96%    BMI 19.69 kg/m  If your blood pressure (BP) was elevated above 135/85 this visit, please have this repeated by your doctor within one month. ---------------

## 2016-01-01 NOTE — ED Triage Notes (Signed)
Mom heard a hissing sound from the stove and she could smell gas at 0800  She called [facility] and [facility] to the home  Patient was told that they were exposed to Carbon monoxide and advised eval at the ED patient states she does feel a little funny   Complains of headache    She was ambulatory   Neuro intact   No noted sob

## 2016-09-21 ENCOUNTER — Emergency Department (HOSPITAL_COMMUNITY)
Admission: EM | Admit: 2016-09-21 | Discharge: 2016-09-22 | Discharge status: Against Medical Advice | Payer: Medicaid Other | Attending: Emergency Medicine | Admitting: Emergency Medicine

## 2016-09-21 ENCOUNTER — Encounter (HOSPITAL_COMMUNITY): Payer: Self-pay

## 2016-09-21 DIAGNOSIS — R5383 Other fatigue: Secondary | ICD-10-CM | POA: Insufficient documentation

## 2016-09-21 DIAGNOSIS — R200 Anesthesia of skin: Secondary | ICD-10-CM | POA: Insufficient documentation

## 2016-09-21 LAB — URINALYSIS, ROUTINE W REFLEX MICROSCOPIC
BILIRUBIN URINE: NEGATIVE
Glucose, UA: NEGATIVE mg/dL
HGB URINE DIPSTICK: NEGATIVE
KETONES UR: NEGATIVE mg/dL
Leukocytes, UA: NEGATIVE
NITRITE: NEGATIVE
Protein, ur: NEGATIVE mg/dL
SPECIFIC GRAVITY, URINE: 1.017 (ref 1.005–1.030)
pH: 7 (ref 5.0–8.0)

## 2016-09-21 LAB — CBC
HCT: 34.7 % — ABNORMAL LOW (ref 36.0–46.0)
HEMOGLOBIN: 10.9 g/dL — AB (ref 12.0–15.0)
MCH: 27.9 pg (ref 26.0–34.0)
MCHC: 31.4 g/dL (ref 30.0–36.0)
MCV: 89 fL (ref 78.0–100.0)
Platelets: 400 10*3/uL (ref 150–400)
RBC: 3.9 MIL/uL (ref 3.87–5.11)
RDW: 13.3 % (ref 11.5–15.5)
WBC: 6.8 10*3/uL (ref 4.0–10.5)

## 2016-09-21 LAB — BASIC METABOLIC PANEL
ANION GAP: 6 (ref 5–15)
BUN: 7 mg/dL (ref 6–20)
CALCIUM: 9.3 mg/dL (ref 8.9–10.3)
CO2: 24 mmol/L (ref 22–32)
CREATININE: 0.86 mg/dL (ref 0.44–1.00)
Chloride: 105 mmol/L (ref 101–111)
GFR calc Af Amer: >60 mL/min (ref >60)
GLUCOSE: 91 mg/dL (ref 65–99)
Potassium: 4 mmol/L (ref 3.5–5.1)
Sodium: 135 mmol/L (ref 135–145)

## 2016-09-21 LAB — I-STAT BETA HCG BLOOD, ED (MC, WL, AP ONLY)

## 2016-09-21 NOTE — ED Notes (Addendum)
Called pt name x3 for vitals. No response. 

## 2016-09-21 NOTE — ED Notes (Signed)
No answer in [facility]

## 2016-09-22 NOTE — ED Notes (Signed)
No answer in [facility]

## 2016-12-26 ENCOUNTER — Encounter (HOSPITAL_COMMUNITY): Payer: Self-pay | Admitting: Emergency Medicine

## 2016-12-26 ENCOUNTER — Emergency Department (HOSPITAL_COMMUNITY)
Admission: EM | Admit: 2016-12-26 | Discharge: 2016-12-26 | Disposition: A | Discharge status: Home / Self Care | Payer: Medicaid Other | Attending: Emergency Medicine | Admitting: Emergency Medicine

## 2016-12-26 DIAGNOSIS — F17210 Nicotine dependence, cigarettes, uncomplicated: Secondary | ICD-10-CM | POA: Insufficient documentation

## 2016-12-26 DIAGNOSIS — J069 Acute upper respiratory infection, unspecified: Secondary | ICD-10-CM

## 2016-12-26 DIAGNOSIS — I10 Essential (primary) hypertension: Secondary | ICD-10-CM | POA: Diagnosis not present

## 2016-12-26 DIAGNOSIS — R0981 Nasal congestion: Secondary | ICD-10-CM | POA: Diagnosis not present

## 2016-12-26 DIAGNOSIS — R05 Cough: Secondary | ICD-10-CM | POA: Diagnosis not present

## 2016-12-26 DIAGNOSIS — B9789 Other viral agents as the cause of diseases classified elsewhere: Secondary | ICD-10-CM

## 2016-12-26 DIAGNOSIS — R070 Pain in throat: Secondary | ICD-10-CM | POA: Diagnosis present

## 2016-12-26 DIAGNOSIS — Z79899 Other long term (current) drug therapy: Secondary | ICD-10-CM | POA: Insufficient documentation

## 2016-12-26 LAB — RAPID STREP SCREEN (MED CTR MEBANE ONLY): STREPTOCOCCUS, GROUP A SCREEN (DIRECT): NEGATIVE

## 2016-12-26 NOTE — Discharge Instructions (Signed)
You presented to the emergency department for sore throat, nasal congestion and left ear pain. You have an upper viral respiratory infection. Your sinus congestion is affecting the pressure in your sinuses and causing your left ear pain. On exam today you do not have an ear infection. We will treat your symptoms with Flonase and over-the-counter  allergy medication (example: Zyrtec, Allegra, etc.) for nasal congestion. Take ibuprofen or Tylenol for ear discomfort and throat pain. If you continue to have sinus congestion you may develop an ear infection. Please monitor your symptoms. Your symptoms should resolve in the next 3-5 days. Follow up with a primary care provider in one week if symptoms worsen or persist.

## 2016-12-26 NOTE — ED Provider Notes (Signed)
[facility]-EMER[facility]Y DEPT Provider Note   CSN: [id number] Arrival date & time: 12/26/16  1139     History   Chief Complaint Chief Complaint  Patient presents with  . Sore Throat    HPI Elgie CongoLanika Fear is a 31 y.o. female presents to ED for evaluation of sore throat,dry cough, nasal congestion and left ear pain and "stuffiness" x 3 days. Feels like her left ear needs to pop. No fevers, chills, chest pain, shortness of breath, body aches, sinus pain. Has 3 young kids and works at [employer], she wants to make sure she doesn't have strep throat. Has been taking tylenol without significant relief of symptoms.   HPI  Past Medical History:  Diagnosis Date  . Hypertension   . Kidney stones   . Placenta previa     Patient Active Problem List   Diagnosis Date Noted  . S/P cesarean section 06/18/2013  . Placenta previa antepartum in third trimester 05/03/2013  . Placenta previa 04/19/2013  . Vaginal bleeding in pregnancy 03/25/2013    Past Surgical History:  Procedure Laterality Date  . CESAREAN SECTION    . CESAREAN SECTION N/A 06/18/2013   Procedure: CESAREAN SECTION;  Surgeon: Kathreen CosierBernard A Marshall, MD;  Location: [facility];  Service: Obstetrics;  Laterality: N/A;  . CESAREAN SECTION WITH BILATERAL TUBAL LIGATION Bilateral 06/18/2013   Procedure: REPEAT CESAREAN SECTION WITH BILATERAL TUBAL LIGATION;  Surgeon: Kathreen CosierBernard A Marshall, MD;  Location: [facility];  Service: Obstetrics;  Laterality: Bilateral;    OB History    Gravida Para Term Preterm AB Living   3 3 2 1   3    SAB TAB Ectopic Multiple Live Births           3       Home Medications    Prior to Admission medications   Medication Sig Start Date End Date Taking? Authorizing Provider  HYDROcodone-acetaminophen (NORCO/VICODIN) 5-325 MG per tablet Take 1 tablet by mouth every 4 (four) hours as needed. 10/16/14   Hayden RasmussenMabe, David, NP  metoCLOPramide (REGLAN) 10 MG tablet Take 1 tablet (10 mg total) by mouth every 6 (six) hours as needed  for nausea. 01/31/15   Dione BoozeGlick, David, MD  naproxen (NAPROSYN) 500 MG tablet Take 1 tablet (500 mg total) by mouth 2 (two) times daily as needed for mild pain, moderate pain or headache (TAKE WITH MEALS.). 05/26/15   Street, HardinMercedes, PA-C  penicillin v potassium (VEETID) 500 MG tablet Take 2 tablets (1,000 mg total) by mouth 2 (two) times daily. X 7 days 05/26/15   Street, [place] BendMercedes, PA-C    Family History Family History  Problem Relation Age of Onset  . Diabetes Maternal Grandfather   . Hypertension Maternal Grandfather     Social History Social History  Substance Use Topics  . Smoking status: Current Every Day Smoker    Packs/day: 0.25    Years: 14.00    Types: Cigarettes  . Smokeless tobacco: Former NeurosurgeonUser  . Alcohol use No     Allergies   Hydromet [hydrocodone-homatropine]   Review of Systems Review of Systems  Constitutional: Negative for chills, diaphoresis and fever.  HENT: Positive for congestion, ear pain, postnasal drip, rhinorrhea and sore throat. Negative for sinus pain and sinus pressure.   Respiratory: Positive for cough. Negative for chest tightness and shortness of breath.   Cardiovascular: Negative for chest pain.     Physical Exam Updated Vital Signs BP 115/75 (BP Location: Left Arm)   Pulse 71   Temp 98.4  F (36.9 C) (Oral)   Resp 16   LMP 12/25/2016   SpO2 100%   Physical Exam  Constitutional: She is oriented to person, place, and time. She appears well-developed and well-nourished. No distress.  NAD.  HENT:  Head: Normocephalic and atraumatic.  Right Ear: Tympanic membrane and external ear normal. No tenderness. No mastoid tenderness.  Left Ear: External ear normal. No tenderness. No mastoid tenderness.  Nose: Mucosal edema present. Right sinus exhibits no maxillary sinus tenderness and no frontal sinus tenderness. Left sinus exhibits no maxillary sinus tenderness and no frontal sinus tenderness.  Mouth/Throat: Mucous membranes are normal.  Posterior oropharyngeal erythema present. Tonsils are 0 on the right. Tonsils are 0 on the left.  Moderate mucosal edema bilaterally Oropharynx erythematous w/o edema Tonsils are erythematous, symmetric w/o exudates L TM is cloudy, difficult to visualize bony land marks but without perforation, bulging,or erythema  Eyes: Conjunctivae and EOM are normal. No scleral icterus.  Neck: Normal range of motion. Neck supple.  Cardiovascular: Normal rate, regular rhythm and normal heart sounds.   No murmur heard. Pulmonary/Chest: Effort normal and breath sounds normal. She has no decreased breath sounds. She has no wheezes.  Musculoskeletal: Normal range of motion. She exhibits no deformity.  Neurological: She is alert and oriented to person, place, and time.  Skin: Skin is warm and dry. Capillary refill takes less than 2 seconds.  Psychiatric: She has a normal mood and affect. Her behavior is normal. Judgment and thought content normal.  Nursing note and vitals reviewed.    ED Treatments / Results  Labs (all labs ordered are listed, but only abnormal results are displayed) Labs Reviewed  RAPID STREP SCREEN (NOT AT [facility])  CULTURE, GROUP A STREP [facility])    EKG  EKG Interpretation None       Radiology No results found.  Procedures Procedures (including critical care time)  Medications Ordered in ED Medications - No data to display   Initial Impression / Assessment and Plan / ED Course  I have reviewed the triage vital signs and the nursing notes.  Pertinent labs & imaging results that were available during my care of the patient were reviewed by me and considered in my medical decision making (see chart for details).    Hx and exam c/w viral upper respiratory infection with nasal congestion. Left TM is only mildly cloudy w/o perforation, bulging or erythema. No mastoid tenderness. Exam not consistent with OE, OM or mastoiditis.  No fevers, tachycardia or tachypnea. Lungs clear  bilaterally. I don't think pt needs emergent lab work or imaging at this time. Doubt PNA. Will d/c with flonase, decongestant, NSAIDs and f/u with PCP for worsening or persistent symptoms. Strep negative today.   Final Clinical Impressions(s) / ED Diagnoses   Final diagnoses:  Viral URI with cough  Nasal sinus congestion    New Prescriptions Discharge Medication List as of 12/26/2016  1:26 PM       Liberty Handy, PA-C 12/26/16 1428    Lorre Nick, MD 12/29/16 1410

## 2016-12-26 NOTE — ED Triage Notes (Signed)
Pt states that she has had a sore throat and generalized body aches x 3 days. Alert and oriented.

## 2016-12-28 LAB — CULTURE, GROUP A STREP (THRC)

## 2017-02-06 ENCOUNTER — Encounter (HOSPITAL_COMMUNITY): Payer: Self-pay | Admitting: *Deleted

## 2017-02-06 ENCOUNTER — Emergency Department (HOSPITAL_COMMUNITY): Payer: Medicaid Other

## 2017-02-06 ENCOUNTER — Emergency Department (HOSPITAL_COMMUNITY)
Admission: EM | Admit: 2017-02-06 | Discharge: 2017-02-06 | Disposition: A | Discharge status: Home / Self Care | Payer: Medicaid Other | Attending: Emergency Medicine | Admitting: Emergency Medicine

## 2017-02-06 DIAGNOSIS — W010XXA Fall on same level from slipping, tripping and stumbling without subsequent striking against object, initial encounter: Secondary | ICD-10-CM | POA: Insufficient documentation

## 2017-02-06 DIAGNOSIS — Y9389 Activity, other specified: Secondary | ICD-10-CM | POA: Insufficient documentation

## 2017-02-06 DIAGNOSIS — Z87891 Personal history of nicotine dependence: Secondary | ICD-10-CM | POA: Diagnosis not present

## 2017-02-06 DIAGNOSIS — S93401A Sprain of unspecified ligament of right ankle, initial encounter: Secondary | ICD-10-CM | POA: Diagnosis not present

## 2017-02-06 DIAGNOSIS — I10 Essential (primary) hypertension: Secondary | ICD-10-CM | POA: Diagnosis not present

## 2017-02-06 DIAGNOSIS — Y998 Other external cause status: Secondary | ICD-10-CM | POA: Diagnosis not present

## 2017-02-06 DIAGNOSIS — Y929 Unspecified place or not applicable: Secondary | ICD-10-CM | POA: Insufficient documentation

## 2017-02-06 DIAGNOSIS — S99911A Unspecified injury of right ankle, initial encounter: Secondary | ICD-10-CM | POA: Diagnosis present

## 2017-02-06 DIAGNOSIS — M25571 Pain in right ankle and joints of right foot: Secondary | ICD-10-CM

## 2017-02-06 MED ORDER — IBUPROFEN 600 MG PO TABS: 600.0000 mg | ORAL_TABLET | Freq: Four times a day (QID) | ORAL | 0 refills | Status: DC | PRN

## 2017-02-06 MED ORDER — IBUPROFEN 200 MG PO TABS
600.0000 mg | ORAL_TABLET | Freq: Once | ORAL | Status: AC
  Administered 2017-02-06: 600 mg via ORAL
  Filled 2017-02-06: qty 1

## 2017-02-06 NOTE — ED Notes (Signed)
Pt taken to XR.  

## 2017-02-06 NOTE — ED Triage Notes (Signed)
Pt states tripped and fell and now with right ankle pain pain with swelling

## 2017-02-06 NOTE — Discharge Instructions (Signed)
Your x-ray shows no evidence of fracture. Use crutches and ankle brace for support. Keep ankle elevated as much as possible and use ice. You can use ibuprofen as needed for pain relief. If ankle is not improving after a week please follow up with orthopedics. Follow-up with your PCP as needed.

## 2017-02-06 NOTE — ED Provider Notes (Signed)
[facility] Provider Note   CSN: [id number] Arrival date & time: 02/06/17  1109     History   Chief Complaint Chief Complaint  Patient presents with  . Ankle Pain    HPI  [name] Hendrix is a 31 y.o. Female with a history of hypertension, who presents with Right ankle pain. Patient reports she tripped and fell today and rolled her ankle, since then she's had pain and swelling to the right ankle. Pt describes pain as constant throbbing. No other injury from trip and fall. Patient able to bear weight and drove herself here, but reports pain with dorsiflexion and plantarflexion. She has not taken anything at home for the pain. No numbness or tingling. No previous ankle injury, no pain at the knee.      Past Medical History:  Diagnosis Date  . Hypertension   . Kidney stones   . Placenta previa     Patient Active Problem List   Diagnosis Date Noted  . S/P cesarean section 06/18/2013  . Placenta previa antepartum in third trimester 05/03/2013  . Placenta previa 04/19/2013  . Vaginal bleeding in pregnancy 03/25/2013    Past Surgical History:  Procedure Laterality Date  . CESAREAN SECTION    . CESAREAN SECTION N/A 06/18/2013   Procedure: CESAREAN SECTION;  Surgeon: [name], MD;  Location: [facility];  Service: Obstetrics;  Laterality: N/A;  . CESAREAN SECTION WITH BILATERAL TUBAL LIGATION Bilateral 06/18/2013   Procedure: REPEAT CESAREAN SECTION WITH BILATERAL TUBAL LIGATION;  Surgeon: [name], MD;  Location: [facility];  Service: Obstetrics;  Laterality: Bilateral;    OB History    Gravida Para Term Preterm AB Living   SAB TAB Ectopic Multiple Live Births           3       Home Medications    Prior to Admission medications   Medication Sig Start Date End Date Taking? Authorizing Provider  HYDROcodone-acetaminophen (NORCO/VICODIN) 5-325 MG per tablet Take 1 tablet by mouth every 4 (four) hours as needed. 10/16/14   Hayden Rasmussen, NP    metoCLOPramide (REGLAN) 10 MG tablet Take 1 tablet (10 mg total) by mouth every 6 (six) hours as needed for nausea. 01/31/15   Dione Booze, MD  naproxen (NAPROSYN) 500 MG tablet Take 1 tablet (500 mg total) by mouth 2 (two) times daily as needed for mild pain, moderate pain or headache (TAKE WITH MEALS.). 05/26/15   Street, [place], PA-C  penicillin v potassium (VEETID) 500 MG tablet Take 2 tablets (1,000 mg total) by mouth 2 (two) times daily. X 7 days 05/26/15   Street, [place], PA-C    Family History Family History  Problem Relation Age of Onset  . Diabetes Maternal Grandfather   . Hypertension Maternal Grandfather     Social History Social History  Substance Use Topics  . Smoking status: Former Smoker    Packs/day: 0.25    Years: 14.00    Types: Cigarettes  . Smokeless tobacco: Former Neurosurgeon  . Alcohol use No     Allergies   Hydromet [hydrocodone-homatropine]   Review of Systems Review of Systems  Constitutional: Negative for chills and fever.  Musculoskeletal: Positive for arthralgias (Right ankle) and joint swelling.  Neurological: Negative for weakness and numbness.     Physical Exam Updated Vital Signs BP (!) 122/92 (BP Location: Left Arm)   Pulse 82   Temp 98.6 F (37 C) (Oral)  Resp 17   LMP 01/22/2017   SpO2 99%   Physical Exam  Constitutional: She appears well-developed and well-nourished. No distress.  HENT:  Head: Normocephalic and atraumatic.  Eyes: Right eye exhibits no discharge. Left eye exhibits no discharge.  Pulmonary/Chest: Effort normal. No respiratory distress.  Musculoskeletal:  Right ankle with swelling and pain with swelling and pain over lateral malleolus. No bruising or discoloration. NTTP over metatarsals, medial malleolus or calcaneus. 2+ DP and PT pulses and good cap refill, sensation intact, dorsiflexion and plantarflexion slightly limited by pain.  No tenderness at knee or fibular head  Neurological: She is alert. Coordination  normal.  Skin: Skin is warm and dry. Capillary refill takes less than 2 seconds. She is not diaphoretic.  Psychiatric: She has a normal mood and affect. Her behavior is normal.  Nursing note and vitals reviewed.    ED Treatments / Results  Labs (all labs ordered are listed, but only abnormal results are displayed) Labs Reviewed - No data to display  EKG  EKG Interpretation None       Radiology Dg Ankle Complete Right  Result Date: 02/06/2017 CLINICAL DATA:  Larey Seat last night, rolled RIGHT ankle, posterior and lateral malleolar pain EXAM: RIGHT ANKLE - COMPLETE 3+ VIEW COMPARISON:  LEFT tibial and fibular radiographs 11/30/2014 FINDINGS: Osseous mineralization normal. Joint spaces preserved. No acute fracture, dislocation, or bone destruction. IMPRESSION: No acute osseous abnormalities. Electronically Signed   By: Ulyses Southward M.D.   On: 02/06/2017 12:25    Procedures Procedures (including critical care time)  Medications Ordered in ED Medications  ibuprofen (ADVIL,MOTRIN) tablet 600 mg (600 mg Oral Given 02/06/17 1312)     Initial Impression / Assessment and Plan / ED Course  I have reviewed the triage vital signs and the nursing notes.  Pertinent labs & imaging results that were available during my care of the patient were reviewed by me and considered in my medical decision making (see chart for details).  Pt presents with right ankle pain. Swelling and tenderness over lateral malleolus on exam. No fracture on XR, presentation consistent with ankle sprain, likely anterior talofibular ligament. Pain adequately treated with ibuprofen and ice in ED. Pt provided crutches and ASO brace in ED, ambulated w/ crutches without difficulty. Pt to follow up with orthopedics if symptoms not improving after a week, ibuprofen for pain. Encouraged pt to use RICE therapy. Return precautions discussed. Pt expresses understanding and agrees with plan.   Final Clinical Impressions(s) / ED  Diagnoses   Final diagnoses:  Acute right ankle pain  Sprain of right ankle, unspecified ligament, initial encounter    New Prescriptions Discharge Medication List as of 02/06/2017  1:14 PM    START taking these medications   Details  ibuprofen (ADVIL,MOTRIN) 600 MG tablet Take 1 tablet (600 mg total) by mouth every 6 (six) hours as needed., Starting Mon 02/06/2017, Print         Dartha Lodge, PA-C 02/06/17 2219    Raeford Razor, MD 02/07/17 1150

## 2017-07-19 ENCOUNTER — Encounter (HOSPITAL_COMMUNITY): Payer: Self-pay

## 2017-07-19 ENCOUNTER — Inpatient Hospital Stay (HOSPITAL_COMMUNITY)
Admission: AD | Admit: 2017-07-19 | Discharge: 2017-07-19 | Disposition: A | Discharge status: Home / Self Care | Payer: Medicaid Other | Source: Ambulatory Visit | Attending: Family Medicine | Admitting: Family Medicine

## 2017-07-19 DIAGNOSIS — N946 Dysmenorrhea, unspecified: Secondary | ICD-10-CM | POA: Diagnosis not present

## 2017-07-19 DIAGNOSIS — Z87891 Personal history of nicotine dependence: Secondary | ICD-10-CM | POA: Diagnosis not present

## 2017-07-19 DIAGNOSIS — R1030 Lower abdominal pain, unspecified: Secondary | ICD-10-CM | POA: Insufficient documentation

## 2017-07-19 DIAGNOSIS — N760 Acute vaginitis: Secondary | ICD-10-CM | POA: Insufficient documentation

## 2017-07-19 DIAGNOSIS — B9689 Other specified bacterial agents as the cause of diseases classified elsewhere: Secondary | ICD-10-CM

## 2017-07-19 LAB — URINALYSIS, ROUTINE W REFLEX MICROSCOPIC
BILIRUBIN URINE: NEGATIVE
Glucose, UA: NEGATIVE mg/dL
HGB URINE DIPSTICK: NEGATIVE
KETONES UR: NEGATIVE mg/dL
Leukocytes, UA: NEGATIVE
NITRITE: NEGATIVE
PH: 7 (ref 5.0–8.0)
Protein, ur: NEGATIVE mg/dL
SPECIFIC GRAVITY, URINE: 1.016 (ref 1.005–1.030)

## 2017-07-19 LAB — CBC
HCT: 36.9 % (ref 36.0–46.0)
Hemoglobin: 11.8 g/dL — ABNORMAL LOW (ref 12.0–15.0)
MCH: 28.9 pg (ref 26.0–34.0)
MCHC: 32 g/dL (ref 30.0–36.0)
MCV: 90.4 fL (ref 78.0–100.0)
Platelets: 421 10*3/uL — ABNORMAL HIGH (ref 150–400)
RBC: 4.08 MIL/uL (ref 3.87–5.11)
RDW: 14.9 % (ref 11.5–15.5)
WBC: 6.8 10*3/uL (ref 4.0–10.5)

## 2017-07-19 LAB — POCT PREGNANCY, URINE: PREG TEST UR: NEGATIVE

## 2017-07-19 LAB — WET PREP, GENITAL
Sperm: NONE SEEN
Trich, Wet Prep: NONE SEEN
Yeast Wet Prep HPF POC: NONE SEEN

## 2017-07-19 LAB — GC/CHLAMYDIA PROBE AMP (~~LOC~~) NOT AT ARMC
CHLAMYDIA, DNA PROBE: NEGATIVE
NEISSERIA GONORRHEA: NEGATIVE

## 2017-07-19 MED ORDER — KETOROLAC TROMETHAMINE 60 MG/2ML IM SOLN
60.0000 mg | Freq: Once | INTRAMUSCULAR | Status: AC
  Administered 2017-07-19: 60 mg via INTRAMUSCULAR
  Filled 2017-07-19: qty 2

## 2017-07-19 MED ORDER — METRONIDAZOLE 500 MG PO TABS: 500.0000 mg | ORAL_TABLET | Freq: Two times a day (BID) | ORAL | 0 refills | Status: DC

## 2017-07-19 MED ORDER — IBUPROFEN 800 MG PO TABS: 800.0000 mg | ORAL_TABLET | Freq: Three times a day (TID) | ORAL | 0 refills | Status: DC

## 2017-07-19 NOTE — MAU Provider Note (Signed)
History     CSN: [id number]  Arrival date and time: 07/19/17 0208   First Provider Initiated Contact with Patient 07/19/17 0248     Chief Complaint  Patient presents with  . Abdominal Pain  . Vaginal Bleeding   HPI [name] Hendrix is a 32 y.o. [phone] non pregnant female who presents with lower abdominal cramping and spotting when she wipes. She states for the last year, her periods have become more irregular and painful. She states this pain comes every time she starts to have bleeding. She rates the pain a 6/10 and has not tried anything for the pain. She states the bleeding is only a small amount when she wipes but her regular periods are heavy with clots.   OB History    Gravida  4   Para  3   Term  2   Preterm  1   AB  1   Living  3     SAB  1   TAB      Ectopic      Multiple      Live Births  3           Past Medical History:  Diagnosis Date  . Hypertension   . Kidney stones   . Placenta previa     Past Surgical History:  Procedure Laterality Date  . CESAREAN SECTION    . CESAREAN SECTION N/A 06/18/2013   Procedure: CESAREAN SECTION;  Surgeon: [name], MD;  Location: [facility];  Service: Obstetrics;  Laterality: N/A;  . CESAREAN SECTION WITH BILATERAL TUBAL LIGATION Bilateral 06/18/2013   Procedure: REPEAT CESAREAN SECTION WITH BILATERAL TUBAL LIGATION;  Surgeon: [name], MD;  Location: [facility];  Service: Obstetrics;  Laterality: Bilateral;    Family History  Problem Relation Age of Onset  . Diabetes Maternal Grandfather   . Hypertension Maternal Grandfather     Social History   Tobacco Use  . Smoking status: Former Smoker    Packs/day: 0.25    Years: 14.00    Pack years: 3.50    Types: Cigarettes  . Smokeless tobacco: Former Engineer, water Use Topics  . Alcohol use: No  . Drug use: No    Allergies:  Allergies  Allergen Reactions  . Hydromet [Hydrocodone-Homatropine] Hives    Medications Prior to Admission   Medication Sig Dispense Refill Last Dose  . HYDROcodone-acetaminophen (NORCO/VICODIN) 5-325 MG per tablet Take 1 tablet by mouth every 4 (four) hours as needed. 15 tablet 0 Past Week at Unknown time  . ibuprofen (ADVIL,MOTRIN) 600 MG tablet Take 1 tablet (600 mg total) by mouth every 6 (six) hours as needed. 30 tablet 0   . metoCLOPramide (REGLAN) 10 MG tablet Take 1 tablet (10 mg total) by mouth every 6 (six) hours as needed for nausea. 30 tablet 0   . naproxen (NAPROSYN) 500 MG tablet Take 1 tablet (500 mg total) by mouth 2 (two) times daily as needed for mild pain, moderate pain or headache (TAKE WITH MEALS.). 20 tablet 0   . penicillin v potassium (VEETID) 500 MG tablet Take 2 tablets (1,000 mg total) by mouth 2 (two) times daily. X 7 days 28 tablet 0     Review of Systems  Constitutional: Negative.  Negative for fatigue and fever.  HENT: Negative.   Respiratory: Negative.  Negative for shortness of breath.   Cardiovascular: Negative.  Negative for chest pain.  Gastrointestinal: Negative.  Negative for abdominal pain, constipation, diarrhea, nausea and  vomiting.  Genitourinary: Negative.  Negative for dysuria.  Neurological: Negative.  Negative for dizziness and headaches.   Physical Exam   Blood pressure 107/84, pulse 70, temperature 98.2 F (36.8 C), temperature source Oral, resp. rate 18, height 5' (1.524 m), weight 113 lb (51.3 kg), last menstrual period 07/01/2017, SpO2 100 %.  Physical Exam  Nursing note and vitals reviewed. Constitutional: She is oriented to person, place, and time. She appears well-developed and well-nourished. No distress.  HENT:  Head: Normocephalic.  Eyes: Pupils are equal, round, and reactive to light.  Cardiovascular: Normal rate, regular rhythm and normal heart sounds.  Respiratory: Effort normal and breath sounds normal. No respiratory distress.  GI: Soft. Bowel sounds are normal. She exhibits no distension. There is no tenderness.  Genitourinary:   Genitourinary Comments: Scant vaginal bleeding  Neurological: She is alert and oriented to person, place, and time.  Skin: Skin is warm and dry.  Psychiatric: She has a normal mood and affect. Her behavior is normal. Judgment and thought content normal.    MAU Course  Procedures Results for orders placed or performed during the hospital encounter of 07/19/17 (from the past 24 hour(s))  Urinalysis, Routine w reflex microscopic     Status: Abnormal   Collection Time: 07/19/17  2:25 AM  Result Value Ref Range   Color, Urine YELLOW YELLOW   APPearance HAZY (A) CLEAR   Specific Gravity, Urine 1.016 1.005 - 1.030   pH 7.0 5.0 - 8.0   Glucose, UA NEGATIVE NEGATIVE mg/dL   Hgb urine dipstick NEGATIVE NEGATIVE   Bilirubin Urine NEGATIVE NEGATIVE   Ketones, ur NEGATIVE NEGATIVE mg/dL   Protein, ur NEGATIVE NEGATIVE mg/dL   Nitrite NEGATIVE NEGATIVE   Leukocytes, UA NEGATIVE NEGATIVE  Pregnancy, urine POC     Status: None   Collection Time: 07/19/17  2:44 AM  Result Value Ref Range   Preg Test, Ur NEGATIVE NEGATIVE  CBC     Status: Abnormal   Collection Time: 07/19/17  3:01 AM  Result Value Ref Range   WBC 6.8 4.0 - 10.5 K/uL   RBC 4.08 3.87 - 5.11 MIL/uL   Hemoglobin 11.8 (L) 12.0 - 15.0 g/dL   HCT 47.4 25.9 - 56.3 %   MCV 90.4 78.0 - 100.0 fL   MCH 28.9 26.0 - 34.0 pg   MCHC 32.0 30.0 - 36.0 g/dL   RDW 87.5 64.3 - 32.9 %   Platelets 421 (H) 150 - 400 K/uL  Wet prep, genital     Status: Abnormal   Collection Time: 07/19/17  3:16 AM  Result Value Ref Range   Yeast Wet Prep HPF POC NONE SEEN NONE SEEN   Trich, Wet Prep NONE SEEN NONE SEEN   Clue Cells Wet Prep HPF POC PRESENT (A) NONE SEEN   WBC, Wet Prep HPF POC MODERATE (A) NONE SEEN   Sperm NONE SEEN    MDM UA, UPT CBC Toradol 60mg - patient reports relief from pain Wet prep and gc/chlamydia  Assessment and Plan   1. Bacterial vaginosis   2. Dysmenorrhea    -Discharge home in stable condition -Rx for metronidazole  and ibuprofen given to patient -Vaginal bleeding precautions discussed -Patient advised to follow-up with [facility] for irregular periods -Patient may return to MAU as needed or if her condition were to change or worsen  Rolm Bookbinder CNM 07/19/2017, 2:53 AM   Allergies as of 07/19/2017      Reactions   Hydromet [hydrocodone-homatropine] Hives  Medication List    STOP taking these medications   HYDROcodone-acetaminophen 5-325 MG tablet Commonly known as:  NORCO/VICODIN   metoCLOPramide 10 MG tablet Commonly known as:  REGLAN   naproxen 500 MG tablet Commonly known as:  NAPROSYN   penicillin v potassium 500 MG tablet Commonly known as:  VEETID     TAKE these medications   ibuprofen 800 MG tablet Commonly known as:  ADVIL,MOTRIN Take 1 tablet (800 mg total) by mouth 3 (three) times daily. What changed:    medication strength  how much to take  when to take this  reasons to take this   metroNIDAZOLE 500 MG tablet Commonly known as:  FLAGYL Take 1 tablet (500 mg total) by mouth 2 (two) times daily.

## 2017-07-19 NOTE — Discharge Instructions (Signed)
Bacterial Vaginosis Bacterial vaginosis is a vaginal infection that occurs when the normal balance of bacteria in the vagina is disrupted. It results from an overgrowth of certain bacteria. This is the most common vaginal infection among women ages 15-44. Because bacterial vaginosis increases your risk for STIs (sexually transmitted infections), getting treated can help reduce your risk for chlamydia, gonorrhea, herpes, and HIV (human immunodeficiency virus). Treatment is also important for preventing complications in pregnant women, because this condition can cause an early (premature) delivery. What are the causes? This condition is caused by an increase in harmful bacteria that are normally present in small amounts in the vagina. However, the reason that the condition develops is not fully understood. What increases the risk? The following factors may make you more likely to develop this condition:  Having a new sexual partner or multiple sexual partners.  Having unprotected sex.  Douching.  Having an intrauterine device (IUD).  Smoking.  Drug and alcohol abuse.  Taking certain antibiotic medicines.  Being pregnant.  You cannot get bacterial vaginosis from toilet seats, bedding, swimming pools, or contact with objects around you. What are the signs or symptoms? Symptoms of this condition include:  Grey or white vaginal discharge. The discharge can also be watery or foamy.  A fish-like odor with discharge, especially after sexual intercourse or during menstruation.  Itching in and around the vagina.  Burning or pain with urination.  Some women with bacterial vaginosis have no signs or symptoms. How is this diagnosed? This condition is diagnosed based on:  Your medical history.  A physical exam of the vagina.  Testing a sample of vaginal fluid under a microscope to look for a large amount of bad bacteria or abnormal cells. Your health care provider may use a cotton swab  or a small wooden spatula to collect the sample.  How is this treated? This condition is treated with antibiotics. These may be given as a pill, a vaginal cream, or a medicine that is put into the vagina (suppository). If the condition comes back after treatment, a second round of antibiotics may be needed. Follow these instructions at home: Medicines  Take over-the-counter and prescription medicines only as told by your health care provider.  Take or use your antibiotic as told by your health care provider. Do not stop taking or using the antibiotic even if you start to feel better. General instructions  If you have a female sexual partner, tell her that you have a vaginal infection. She should see her health care provider and be treated if she has symptoms. If you have a female sexual partner, he does not need treatment.  During treatment: ? Avoid sexual activity until you finish treatment. ? Do not douche. ? Avoid alcohol as directed by your health care provider. ? Avoid breastfeeding as directed by your health care provider.  Drink enough water and fluids to keep your urine clear or pale yellow.  Keep the area around your vagina and rectum clean. ? Wash the area daily with warm water. ? Wipe yourself from front to back after using the toilet.  Keep all follow-up visits as told by your health care provider. This is important. How is this prevented?  Do not douche.  Wash the outside of your vagina with warm water only.  Use protection when having sex. This includes latex condoms and dental dams.  Limit how many sexual partners you have. To help prevent bacterial vaginosis, it is best to have sex with just   one partner (monogamous).  Make sure you and your sexual partner are tested for STIs.  Wear cotton or cotton-lined underwear.  Avoid wearing tight pants and pantyhose, especially during summer.  Limit the amount of alcohol that you drink.  Do not use any products that  contain nicotine or tobacco, such as cigarettes and e-cigarettes. If you need help quitting, ask your health care provider.  Do not use illegal drugs. Where to find more information:  Centers for Disease Control and Prevention: www.cdc.gov/std  American Sexual Health Association (ASHA): www.ashastd.org  U.S. Department of Health and Human Services, Office on Women's Health: www.womenshealth.gov/ or https://www.womenshealth.gov/a-z-topics/bacterial-vaginosis Contact a health care provider if:  Your symptoms do not improve, even after treatment.  You have more discharge or pain when urinating.  You have a fever.  You have pain in your abdomen.  You have pain during sex.  You have vaginal bleeding between periods. Summary  Bacterial vaginosis is a vaginal infection that occurs when the normal balance of bacteria in the vagina is disrupted.  Because bacterial vaginosis increases your risk for STIs (sexually transmitted infections), getting treated can help reduce your risk for chlamydia, gonorrhea, herpes, and HIV (human immunodeficiency virus). Treatment is also important for preventing complications in pregnant women, because the condition can cause an early (premature) delivery.  This condition is treated with antibiotic medicines. These may be given as a pill, a vaginal cream, or a medicine that is put into the vagina (suppository). This information is not intended to replace advice given to you by your health care provider. Make sure you discuss any questions you have with your health care provider. Document Released: 04/11/2005 Document Revised: 08/15/2016 Document Reviewed: 12/26/2015 Elsevier Interactive Patient Education  2018 Elsevier Inc.  

## 2017-07-19 NOTE — MAU Note (Signed)
Pt here with c/o abdominal pain and spotting. Had BTL in 2015. LMP 07/01/17 but been having irregular bleeding.

## 2017-12-17 ENCOUNTER — Emergency Department (HOSPITAL_COMMUNITY)
Admission: EM | Admit: 2017-12-17 | Discharge: 2017-12-17 | Disposition: A | Discharge status: Home / Self Care | Payer: Medicaid Other | Attending: Emergency Medicine | Admitting: Emergency Medicine

## 2017-12-17 ENCOUNTER — Other Ambulatory Visit: Payer: Self-pay

## 2017-12-17 ENCOUNTER — Encounter (HOSPITAL_COMMUNITY): Payer: Self-pay | Admitting: Emergency Medicine

## 2017-12-17 DIAGNOSIS — I10 Essential (primary) hypertension: Secondary | ICD-10-CM | POA: Insufficient documentation

## 2017-12-17 DIAGNOSIS — Z87891 Personal history of nicotine dependence: Secondary | ICD-10-CM | POA: Insufficient documentation

## 2017-12-17 DIAGNOSIS — J029 Acute pharyngitis, unspecified: Secondary | ICD-10-CM

## 2017-12-17 LAB — GROUP A STREP BY PCR: GROUP A STREP BY PCR: NOT DETECTED

## 2017-12-17 MED ORDER — DEXAMETHASONE SODIUM PHOSPHATE 10 MG/ML IJ SOLN
10.0000 mg | Freq: Once | INTRAMUSCULAR | Status: AC
  Administered 2017-12-17: 10 mg via INTRAMUSCULAR
  Filled 2017-12-17: qty 1

## 2017-12-17 MED ORDER — KETOROLAC TROMETHAMINE 15 MG/ML IJ SOLN: 15.0000 mg | Freq: Once | INTRAMUSCULAR | Status: DC

## 2017-12-17 NOTE — Discharge Instructions (Signed)
Please continue with logenes and warm drinks. You may take tylenol for fever of 100 degrees or higher. If your symptoms worsen or you experience any chest pain, difficulty swallowing, changes in voice or shortness of breath please return to the Ed.

## 2017-12-17 NOTE — ED Triage Notes (Signed)
Pt. Stated, [name] Hendrix had a sore throat for 2 days, it burns when I swallow.

## 2017-12-17 NOTE — ED Provider Notes (Signed)
[facility] Provider Note   CSN: [id number] Arrival date & time: 12/17/17  1017     History   Chief Complaint Chief Complaint  Patient presents with  . Sore Throat    HPI Elgie CongoLanika Morson is a 32 y.o. female.  32 y/o female with no PMH presents to the ED with a chief complaint of sore throat x 3 days. She describes the pain as burning sensation worse in the morning. She reports pain with swallowing and subjective fever of 99.2. Patient has tried Sales promotion account executiveAlkazelzer warm water with salt and throat logenes but states she has no relieve in symptoms. She denies any cough, rhinorrhea, chest pain voice changes or shortness of breath.       Past Medical History:  Diagnosis Date  . Hypertension   . Kidney stones   . Placenta previa     Patient Active Problem List   Diagnosis Date Noted  . S/P cesarean section 06/18/2013  . Placenta previa antepartum in third trimester 05/03/2013  . Placenta previa 04/19/2013  . Vaginal bleeding in pregnancy 03/25/2013    Past Surgical History:  Procedure Laterality Date  . CESAREAN SECTION    . CESAREAN SECTION N/A 06/18/2013   Procedure: CESAREAN SECTION;  Surgeon: Kathreen CosierBernard A Marshall, MD;  Location: [facility];  Service: Obstetrics;  Laterality: N/A;  . CESAREAN SECTION WITH BILATERAL TUBAL LIGATION Bilateral 06/18/2013   Procedure: REPEAT CESAREAN SECTION WITH BILATERAL TUBAL LIGATION;  Surgeon: Kathreen CosierBernard A Marshall, MD;  Location: [facility];  Service: Obstetrics;  Laterality: Bilateral;     OB History    Gravida  4   Para  3   Term  2   Preterm  1   AB  1   Living  3     SAB  1   TAB      Ectopic      Multiple      Live Births  3            Home Medications    Prior to Admission medications   Medication Sig Start Date End Date Taking? Authorizing Provider  aspirin-sod bicarb-citric acid (ALKA-SELTZER) 325 MG TBEF tablet Take 325 mg by mouth every 6 (six) hours as needed.   Yes [provider]  ibuprofen (ADVIL,MOTRIN) 800 MG tablet Take 1 tablet (800 mg total) by mouth 3 (three) times daily. Patient not taking: Reported on 12/17/2017 07/19/17   Rolm BookbinderNeill, Caroline M, CNM  metroNIDAZOLE (FLAGYL) 500 MG tablet Take 1 tablet (500 mg total) by mouth 2 (two) times daily. Patient not taking: Reported on 12/17/2017 07/19/17   Rolm BookbinderNeill, Caroline M, CNM    Family History Family History  Problem Relation Age of Onset  . Diabetes Maternal Grandfather   . Hypertension Maternal Grandfather     Social History Social History   Tobacco Use  . Smoking status: Former Smoker    Packs/day: 0.25    Years: 14.00    Pack years: 3.50    Types: Cigarettes  . Smokeless tobacco: Former Engineer, waterUser  Substance Use Topics  . Alcohol use: No  . Drug use: No     Allergies   Hydromet [hydrocodone-homatropine]   Review of Systems Review of Systems  Constitutional: Negative for fever.  HENT: Positive for sore throat. Negative for ear pain, facial swelling, rhinorrhea, sinus pressure, sinus pain, sneezing, trouble swallowing and voice change.   Respiratory: Negative for shortness of breath.   Cardiovascular: Negative for chest pain.  All  other systems reviewed and are negative.    Physical Exam Updated Vital Signs BP 110/84 (BP Location: Right Arm)   Pulse 65   Temp 98.8 F (37.1 C) (Oral)   Resp 18   Ht 5' (1.524 m)   Wt 50.8 kg   LMP 11/26/2017   SpO2 100%   BMI 21.87 kg/m   Physical Exam  Constitutional: She is oriented to person, place, and time. She appears well-developed and well-nourished.  HENT:  Head: Normocephalic and atraumatic.  Right Ear: Hearing normal.  Left Ear: Hearing normal.  Mouth/Throat: Uvula is midline and mucous membranes are normal. No uvula swelling. Posterior oropharyngeal edema (mild edema ) and posterior oropharyngeal erythema present. No oropharyngeal exudate or tonsillar abscesses. No tonsillar exudate.  Eyes: Pupils are equal, round, and reactive  to light.  Neck: Normal range of motion. Neck supple.  Cardiovascular: Normal rate.  Pulmonary/Chest: Effort normal.  Abdominal: Soft.  Lymphadenopathy:       Head (right side): No submental, no submandibular and no tonsillar adenopathy present.       Head (left side): Tonsillar adenopathy present. No submental and no submandibular adenopathy present.  Neurological: She is alert and oriented to person, place, and time.  Skin: Skin is warm and dry. Capillary refill takes less than 2 seconds.  Nursing note and vitals reviewed.    ED Treatments / Results  Labs (all labs ordered are listed, but only abnormal results are displayed) Labs Reviewed  GROUP A STREP BY PCR    EKG None  Radiology No results found.  Procedures Procedures (including critical care time)  Medications Ordered in ED Medications  dexamethasone (DECADRON) injection 10 mg (has no administration in time range)     Initial Impression / Assessment and Plan / ED Course  I have reviewed the triage vital signs and the nursing notes.  Pertinent labs & imaging results that were available during my care of the patient were reviewed by me and considered in my medical decision making (see chart for details).     Patient with sore throat x3 days.  She has tried over-the-counter medication but states no relief in symptoms.PCR not detected for strep.Upon examination she shows some erythema and mild edema. She has no facial swelling or tonsillar abscess.  Reports pain with swallowing, at this time I will give her a shot of Decadron in the ED to help with her symptoms.  I have advised her to continue therapy with warm teas and logenes.  Patient would like a work note for Monday, I will provide this for patient with return to work on Tuesday.  Return precautions provided.  Final Clinical Impressions(s) / ED Diagnoses   Final diagnoses:  Sore throat    ED Discharge Orders    None       Claude Manges, Cordelia Poche 12/17/17  1216    Gerhard Munch, MD 12/17/17 [phone]

## 2018-03-13 ENCOUNTER — Emergency Department (HOSPITAL_COMMUNITY)
Admission: EM | Admit: 2018-03-13 | Discharge: 2018-03-13 | Disposition: A | Discharge status: Home / Self Care | Payer: Medicaid Other | Attending: Emergency Medicine | Admitting: Emergency Medicine

## 2018-03-13 ENCOUNTER — Encounter (HOSPITAL_COMMUNITY): Payer: Self-pay | Admitting: Emergency Medicine

## 2018-03-13 DIAGNOSIS — R51 Headache: Secondary | ICD-10-CM | POA: Diagnosis present

## 2018-03-13 DIAGNOSIS — I10 Essential (primary) hypertension: Secondary | ICD-10-CM | POA: Diagnosis not present

## 2018-03-13 DIAGNOSIS — G43009 Migraine without aura, not intractable, without status migrainosus: Secondary | ICD-10-CM | POA: Diagnosis not present

## 2018-03-13 DIAGNOSIS — Z87891 Personal history of nicotine dependence: Secondary | ICD-10-CM | POA: Insufficient documentation

## 2018-03-13 LAB — URINALYSIS, ROUTINE W REFLEX MICROSCOPIC
Bilirubin Urine: NEGATIVE
Glucose, UA: NEGATIVE mg/dL
Hgb urine dipstick: NEGATIVE
Ketones, ur: NEGATIVE mg/dL
LEUKOCYTES UA: NEGATIVE
NITRITE: NEGATIVE
PROTEIN: NEGATIVE mg/dL
Specific Gravity, Urine: 1.001 — ABNORMAL LOW (ref 1.005–1.030)
pH: 6 (ref 5.0–8.0)

## 2018-03-13 LAB — I-STAT BETA HCG BLOOD, ED (MC, WL, AP ONLY): I-stat hCG, quantitative: <5 m[IU]/mL (ref <5)

## 2018-03-13 MED ORDER — DEXAMETHASONE SODIUM PHOSPHATE 10 MG/ML IJ SOLN
20.0000 mg | Freq: Once | INTRAMUSCULAR | Status: AC
  Administered 2018-03-13: 20 mg via INTRAVENOUS
  Filled 2018-03-13: qty 2

## 2018-03-13 MED ORDER — METOCLOPRAMIDE HCL 5 MG/ML IJ SOLN
10.0000 mg | Freq: Once | INTRAMUSCULAR | Status: AC
  Administered 2018-03-13: 10 mg via INTRAVENOUS
  Filled 2018-03-13: qty 2

## 2018-03-13 MED ORDER — SODIUM CHLORIDE 0.9 % IV BOLUS
1000.0000 mL | Freq: Once | INTRAVENOUS | Status: AC
  Administered 2018-03-13: 1000 mL via INTRAVENOUS

## 2018-03-13 MED ORDER — DIPHENHYDRAMINE HCL 50 MG/ML IJ SOLN
25.0000 mg | Freq: Once | INTRAMUSCULAR | Status: AC
  Administered 2018-03-13: 25 mg via INTRAVENOUS
  Filled 2018-03-13: qty 1

## 2018-03-13 MED ORDER — DEXAMETHASONE SODIUM PHOSPHATE 10 MG/ML IJ SOLN: 20.0000 mg | Freq: Once | INTRAMUSCULAR | Status: DC

## 2018-03-13 NOTE — ED Provider Notes (Signed)
[facility] EMERGENCY DEPARTMENT Provider Note   CSN: [id number] Arrival date & time: 03/13/18  2051     History   Chief Complaint Chief Complaint  Patient presents with  . Headache    HPI Crystallee Werden is a 32 y.o. female.  Has medical history of hypertension, migraine, presented to emergency room for 3 days of unilateral sharp pain on her left eye and temple.  Severity of 9.5 out of 10.  Reports photophobia and phonophobia, nausea vomiting and transient blurred vision that is now resolved. She took some over-the-counter medication but headache has not improved  HPI  Past Medical History:  Diagnosis Date  . Hypertension   . Kidney stones   . Placenta previa     Patient Active Problem List   Diagnosis Date Noted  . S/P cesarean section 06/18/2013  . Placenta previa antepartum in third trimester 05/03/2013  . Placenta previa 04/19/2013  . Vaginal bleeding in pregnancy 03/25/2013    Past Surgical History:  Procedure Laterality Date  . CESAREAN SECTION    . CESAREAN SECTION N/A 06/18/2013   Procedure: CESAREAN SECTION;  Surgeon: [name], MD;  Location: [facility];  Service: Obstetrics;  Laterality: N/A;  . CESAREAN SECTION WITH BILATERAL TUBAL LIGATION Bilateral 06/18/2013   Procedure: REPEAT CESAREAN SECTION WITH BILATERAL TUBAL LIGATION;  Surgeon: [name], MD;  Location: [facility];  Service: Obstetrics;  Laterality: Bilateral;     OB History    Gravida  4   Para  3   Term  2   Preterm  1   AB  1   Living  3     SAB  1   TAB      Ectopic      Multiple      Live Births  3            Home Medications    Prior to Admission medications   Medication Sig Start Date End Date Taking? Authorizing Provider  aspirin-sod bicarb-citric acid (ALKA-SELTZER) 325 MG TBEF tablet Take 325 mg by mouth every 6 (six) hours as needed.    [provider]  ibuprofen (ADVIL,MOTRIN) 800 MG tablet Take 1 tablet (800 mg  total) by mouth 3 (three) times daily. Patient not taking: Reported on 12/17/2017 07/19/17   Rolm Bookbinder, CNM  metroNIDAZOLE (FLAGYL) 500 MG tablet Take 1 tablet (500 mg total) by mouth 2 (two) times daily. Patient not taking: Reported on 12/17/2017 07/19/17   Rolm Bookbinder, CNM    Family History Family History  Problem Relation Age of Onset  . Diabetes Maternal Grandfather   . Hypertension Maternal Grandfather     Social History Social History   Tobacco Use  . Smoking status: Former Smoker    Packs/day: 0.25    Years: 14.00    Pack years: 3.50    Types: Cigarettes  . Smokeless tobacco: Former Engineer, water Use Topics  . Alcohol use: No  . Drug use: No     Allergies   Hydromet [hydrocodone-homatropine]   Review of Systems Review of Systems   Physical Exam Updated Vital Signs BP 130/85   Pulse (!) 119   Temp 98.1 F (36.7 C) (Oral)   Resp 16   SpO2 100%   Physical Exam  General: Pleasant young lady HEENT: slight decrease in sensation at left side of face Chest and lung exam: Faint diffuse rale.  Normal work of breathing no acute distress. CV: RRR, no  murmur Abdomen: Soft and non tender, BS present Extremities: no LEE, pulses are present bilaterally Neurologic: Alert and oriented x3, slight decrease in sensation at left side of face Psychiatric: Mood and affect   ED Treatments / Results  Labs (all labs ordered are listed, but only abnormal results are displayed) Labs Reviewed - No data to display  EKG None  Radiology No results found.  Procedures Procedures (including critical care time)  Medications Ordered in ED Medications - No data to display   Initial Impression / Assessment and Plan / ED Course  I have reviewed the triage vital signs and the nursing notes.  Pertinent labs & imaging results that were available during my care of the patient were reviewed by me and considered in my medical decision making (see chart for  details).   -istat HCG--> negative -NS 1000 lt bolus -IV dexamethasone, metoclopramide, Benadryl  Update:  Patient clinically improved still has mild pain around her left eye, extraocular movement intact. We will reevaluate again in May discharge if her symptoms resolved  Final Clinical Impressions(s) / ED Diagnoses   Final diagnoses:  None    ED Discharge Orders    None       Chevis PrettyMasoudi, Chanele Douglas, MD 03/13/18 2322    Gwyneth SproutPlunkett, Whitney, MD 03/15/18 1519

## 2018-03-13 NOTE — ED Triage Notes (Signed)
Pt presents to ED for assessment of left sided headache x 3 days, no relief from OTC meds at home.  Hx of HTN, states she has not been taking any of her medications.  Pt c/o blurred vision x 3 days, no focal neuro deficits noted at sort desk.

## 2018-03-13 NOTE — Discharge Instructions (Addendum)
Please make sure to drink enough fluid and come back to emergency room if your headache did not improved or worsened. Thanks.

## 2019-01-25 ENCOUNTER — Emergency Department (HOSPITAL_COMMUNITY)
Admission: EM | Admit: 2019-01-25 | Discharge: 2019-01-25 | Disposition: A | Discharge status: Home / Self Care | Payer: Medicaid Other | Attending: Emergency Medicine | Admitting: Emergency Medicine

## 2019-01-25 ENCOUNTER — Other Ambulatory Visit: Payer: Self-pay

## 2019-01-25 ENCOUNTER — Encounter (HOSPITAL_COMMUNITY): Payer: Self-pay | Admitting: Emergency Medicine

## 2019-01-25 DIAGNOSIS — N898 Other specified noninflammatory disorders of vagina: Secondary | ICD-10-CM | POA: Diagnosis present

## 2019-01-25 DIAGNOSIS — Z87891 Personal history of nicotine dependence: Secondary | ICD-10-CM | POA: Insufficient documentation

## 2019-01-25 DIAGNOSIS — I10 Essential (primary) hypertension: Secondary | ICD-10-CM | POA: Diagnosis not present

## 2019-01-25 DIAGNOSIS — A5901 Trichomonal vulvovaginitis: Secondary | ICD-10-CM | POA: Insufficient documentation

## 2019-01-25 LAB — URINALYSIS, ROUTINE W REFLEX MICROSCOPIC
Bacteria, UA: NONE SEEN
Bilirubin Urine: NEGATIVE
Glucose, UA: NEGATIVE mg/dL
Hgb urine dipstick: NEGATIVE
Ketones, ur: NEGATIVE mg/dL
Nitrite: NEGATIVE
Protein, ur: NEGATIVE mg/dL
Specific Gravity, Urine: 1.02 (ref 1.005–1.030)
pH: 5 (ref 5.0–8.0)

## 2019-01-25 LAB — WET PREP, GENITAL
Clue Cells Wet Prep HPF POC: NONE SEEN
Sperm: NONE SEEN
Yeast Wet Prep HPF POC: NONE SEEN

## 2019-01-25 LAB — I-STAT BETA HCG BLOOD, ED (MC, WL, AP ONLY): I-stat hCG, quantitative: <5 m[IU]/mL (ref <5)

## 2019-01-25 MED ORDER — AZITHROMYCIN 250 MG PO TABS
1000.0000 mg | ORAL_TABLET | Freq: Once | ORAL | Status: AC
  Administered 2019-01-25: 22:00:00 1000 mg via ORAL
  Filled 2019-01-25: qty 4

## 2019-01-25 MED ORDER — CEFTRIAXONE SODIUM 250 MG IJ SOLR
250.0000 mg | Freq: Once | INTRAMUSCULAR | Status: AC
  Administered 2019-01-25: 22:00:00 250 mg via INTRAMUSCULAR
  Filled 2019-01-25: qty 250

## 2019-01-25 MED ORDER — STERILE WATER FOR INJECTION IJ SOLN
INTRAMUSCULAR | Status: AC
  Administered 2019-01-25: 22:00:00 1.2 mL
  Filled 2019-01-25: qty 10

## 2019-01-25 MED ORDER — METRONIDAZOLE 500 MG PO TABS
2000.0000 mg | ORAL_TABLET | Freq: Once | ORAL | Status: AC
  Administered 2019-01-25: 22:00:00 2000 mg via ORAL
  Filled 2019-01-25: qty 4

## 2019-01-25 NOTE — ED Provider Notes (Signed)
[facility]-EMERGENCY DEPT Provider Note   CSN: [id number] Arrival date & time: 01/25/19  1418     History   Chief Complaint Chief Complaint  Patient presents with  . Flank Pain    HPI [name] Hendrix is a 33 y.o. female.     Pt presents to the ED today with a possible UTI.  She has had some back pain and some dysuria.  She is really here because her significant other told her that he had trichomonas.     Past Medical History:  Diagnosis Date  . Hypertension   . Kidney stones   . Placenta previa     Patient Active Problem List   Diagnosis Date Noted  . S/P cesarean section 06/18/2013  . Placenta previa antepartum in third trimester 05/03/2013  . Placenta previa 04/19/2013  . Vaginal bleeding in pregnancy 03/25/2013    Past Surgical History:  Procedure Laterality Date  . CESAREAN SECTION    . CESAREAN SECTION N/A 06/18/2013   Procedure: CESAREAN SECTION;  Surgeon: [name], MD;  Location: [facility];  Service: Obstetrics;  Laterality: N/A;  . CESAREAN SECTION WITH BILATERAL TUBAL LIGATION Bilateral 06/18/2013   Procedure: REPEAT CESAREAN SECTION WITH BILATERAL TUBAL LIGATION;  Surgeon: [name], MD;  Location: [facility];  Service: Obstetrics;  Laterality: Bilateral;     OB History    Gravida  4   Para  3   Term  2   Preterm  1   AB  1   Living  3     SAB  1   TAB      Ectopic      Multiple      Live Births  3            Home Medications    Prior to Admission medications   Medication Sig Start Date End Date Taking? Authorizing Provider  naproxen sodium (ALEVE) 220 MG tablet Take 220 mg by mouth 2 (two) times daily as needed (for headache).   Yes [provider]  ibuprofen (ADVIL,MOTRIN) 800 MG tablet Take 1 tablet (800 mg total) by mouth 3 (three) times daily. Patient not taking: Reported on 12/17/2017 07/19/17   Rolm Bookbinder, CNM  metroNIDAZOLE (FLAGYL) 500 MG tablet Take 1 tablet (500 mg  total) by mouth 2 (two) times daily. Patient not taking: Reported on 12/17/2017 07/19/17   Rolm Bookbinder, CNM    Family History Family History  Problem Relation Age of Onset  . Diabetes Maternal Grandfather   . Hypertension Maternal Grandfather     Social History Social History   Tobacco Use  . Smoking status: Former Smoker    Packs/day: 0.25    Years: 14.00    Pack years: 3.50    Types: Cigarettes  . Smokeless tobacco: Former Engineer, water Use Topics  . Alcohol use: No  . Drug use: No     Allergies   Hydromet [hydrocodone-homatropine]   Review of Systems Review of Systems  Genitourinary: Positive for dysuria.  All other systems reviewed and are negative.    Physical Exam Updated Vital Signs BP (!) 117/91   Pulse 86   Resp 20   SpO2 100%   Physical Exam Vitals signs and nursing note reviewed. Exam conducted with a chaperone present.  Constitutional:      Appearance: Normal appearance.  HENT:     Head: Normocephalic and atraumatic.     Right Ear: External ear normal.  Left Ear: External ear normal.     Nose: Nose normal.     Mouth/Throat:     Mouth: Mucous membranes are moist.     Pharynx: Oropharynx is clear.  Eyes:     Extraocular Movements: Extraocular movements intact.     Conjunctiva/sclera: Conjunctivae normal.     Pupils: Pupils are equal, round, and reactive to light.  Neck:     Musculoskeletal: Normal range of motion and neck supple.  Cardiovascular:     Rate and Rhythm: Normal rate and regular rhythm.     Pulses: Normal pulses.     Heart sounds: Normal heart sounds.  Pulmonary:     Effort: Pulmonary effort is normal.     Breath sounds: Normal breath sounds.  Abdominal:     General: Abdomen is flat. Bowel sounds are normal.     Palpations: Abdomen is soft.  Genitourinary:    Exam position: Lithotomy position.     Vagina: Normal.     Cervix: Discharge present.     Uterus: Normal.      Adnexa: Right adnexa normal and left  adnexa normal.  Musculoskeletal: Normal range of motion.  Skin:    General: Skin is warm.     Capillary Refill: Capillary refill takes less than 2 seconds.  Neurological:     General: No focal deficit present.     Mental Status: She is alert and oriented to person, place, and time.  Psychiatric:        Mood and Affect: Mood normal.        Behavior: Behavior normal.        Thought Content: Thought content normal.        Judgment: Judgment normal.      ED Treatments / Results  Labs (all labs ordered are listed, but only abnormal results are displayed) Labs Reviewed  WET PREP, GENITAL - Abnormal; Notable for the following components:      Result Value   Trich, Wet Prep PRESENT (*)    WBC, Wet Prep HPF POC MODERATE (*)    All other components within normal limits  URINALYSIS, ROUTINE W REFLEX MICROSCOPIC - Abnormal; Notable for the following components:   APPearance HAZY (*)    Leukocytes,Ua TRACE (*)    All other components within normal limits  I-STAT BETA HCG BLOOD, ED ([facility], [facility], [facility] ONLY)  GC/CHLAMYDIA PROBE AMP ([place]) NOT AT [facility]    EKG None  Radiology No results found.  Procedures Procedures (including critical care time)  Medications Ordered in ED Medications  cefTRIAXone (ROCEPHIN) injection 250 mg (250 mg Intramuscular Given 01/25/19 2132)  azithromycin (ZITHROMAX) tablet 1,000 mg (1,000 mg Oral Given 01/25/19 2130)  metroNIDAZOLE (FLAGYL) tablet 2,000 mg (2,000 mg Oral Given 01/25/19 2131)  sterile water (preservative free) injection (1.2 mLs  Given 01/25/19 2133)     Initial Impression / Assessment and Plan / ED Course  I have reviewed the triage vital signs and the nursing notes.  Pertinent labs & imaging results that were available during my care of the patient were reviewed by me and considered in my medical decision making (see chart for details).  Pt treated with rocephin, zithromax, and flagyl.  She knows to return if worse.  Final Clinical  Impressions(s) / ED Diagnoses   Final diagnoses:  Vaginitis due to Trichomonas    ED Discharge Orders    None       Isla Pence, MD 01/25/19 2137

## 2019-01-25 NOTE — ED Triage Notes (Signed)
Per pt, states she thinks she has a UTI-states right flank pain and urinary frequency-no other symptoms

## 2019-01-29 ENCOUNTER — Telehealth (INDEPENDENT_AMBULATORY_CARE_PROVIDER_SITE_OTHER): Payer: Medicaid Other

## 2019-01-29 ENCOUNTER — Telehealth: Payer: Self-pay | Admitting: Family Medicine

## 2019-01-29 DIAGNOSIS — A64 Unspecified sexually transmitted disease: Secondary | ICD-10-CM

## 2019-01-29 LAB — GC/CHLAMYDIA PROBE AMP (~~LOC~~) NOT AT ARMC
Chlamydia: NEGATIVE
Neisseria Gonorrhea: NEGATIVE

## 2019-01-29 NOTE — Telephone Encounter (Signed)
Pt spoke with front office staff and requested a call back to discuss STD testing.   Pt was seen in the ED on 01/25/19 and tested positive for trichomoniasis. Pt treated with Metronidazole 2g while in the ED. Pt is concerned she did not receive full treatment. Educated pt that she has received appropriate treatment and that she can schedule an appt for a test of cure in 4 weeks. Pt has no further questions at this time and plans to follow up in 4 weeks.   Will notify front office to schedule an appt 4 weeks from 01/25/19.

## 2019-01-29 NOTE — Telephone Encounter (Signed)
Patient called to make an appointment to be tested for a positive STD. She stated she needed medication because she was only given 4 pills. She is requesting a call back today.

## 2019-02-22 ENCOUNTER — Ambulatory Visit: Payer: Medicaid Other

## 2019-02-25 ENCOUNTER — Ambulatory Visit: Payer: Medicaid Other

## 2019-03-26 ENCOUNTER — Ambulatory Visit: Payer: Medicaid Other | Admitting: Obstetrics & Gynecology

## 2019-05-16 ENCOUNTER — Ambulatory Visit (HOSPITAL_COMMUNITY)
Admission: EM | Admit: 2019-05-16 | Discharge: 2019-05-16 | Disposition: A | Discharge status: Home / Self Care | Payer: Medicaid Other | Attending: Family Medicine | Admitting: Family Medicine

## 2019-05-16 ENCOUNTER — Other Ambulatory Visit: Payer: Self-pay

## 2019-05-16 ENCOUNTER — Encounter (HOSPITAL_COMMUNITY): Payer: Self-pay

## 2019-05-16 DIAGNOSIS — R0981 Nasal congestion: Secondary | ICD-10-CM

## 2019-05-16 DIAGNOSIS — Z20822 Contact with and (suspected) exposure to covid-19: Secondary | ICD-10-CM | POA: Insufficient documentation

## 2019-05-16 DIAGNOSIS — I10 Essential (primary) hypertension: Secondary | ICD-10-CM | POA: Diagnosis not present

## 2019-05-16 DIAGNOSIS — R509 Fever, unspecified: Secondary | ICD-10-CM | POA: Insufficient documentation

## 2019-05-16 DIAGNOSIS — Z87891 Personal history of nicotine dependence: Secondary | ICD-10-CM | POA: Insufficient documentation

## 2019-05-16 DIAGNOSIS — R52 Pain, unspecified: Secondary | ICD-10-CM

## 2019-05-16 DIAGNOSIS — Z79899 Other long term (current) drug therapy: Secondary | ICD-10-CM | POA: Diagnosis not present

## 2019-05-16 DIAGNOSIS — R110 Nausea: Secondary | ICD-10-CM | POA: Diagnosis not present

## 2019-05-16 MED ORDER — IBUPROFEN 800 MG PO TABS
ORAL_TABLET | ORAL | Status: AC
  Filled 2019-05-16: qty 1

## 2019-05-16 MED ORDER — ONDANSETRON 4 MG PO TBDP: 4.0000 mg | ORAL_TABLET | Freq: Three times a day (TID) | ORAL | 0 refills | Status: DC | PRN

## 2019-05-16 MED ORDER — IBUPROFEN 800 MG PO TABS
800.0000 mg | ORAL_TABLET | Freq: Once | ORAL | Status: AC
  Administered 2019-05-16: 800 mg via ORAL

## 2019-05-16 NOTE — Discharge Instructions (Addendum)
You have been tested for COVID-19 today. If your test returns positive, you will receive a phone call from [facility] regarding your results. Negative test results are not called. Both positive and negative results area always visible on MyChart. If you do not have a MyChart account, sign up instructions are provided in your discharge papers. Please do not hesitate to contact us should you have questions or concerns.  Please do your best to ensure adequate fluid intake in order to avoid dehydration. If you find that you are unable to tolerate drinking fluids regularly please proceed to the Emergency Department for evaluation.   

## 2019-05-16 NOTE — ED Provider Notes (Addendum)
[facility]   [id number] 05/16/19 Arrival Time: 1853  ASSESSMENT & PLAN:  1. Body aches   2. Fever and chills   3. Nasal congestion   4. Nausea without vomiting     Meds ordered this encounter  Medications  . ibuprofen (ADVIL) tablet 800 mg  . ondansetron (ZOFRAN-ODT) 4 MG disintegrating tablet    Sig: Take 1 tablet (4 mg total) by mouth every 8 (eight) hours as needed for nausea or vomiting.    Dispense:  15 tablet    Refill:  0     COVID-19 testing sent. See letter/work note on file for self-isolation guidelines.  Follow-up Information    [facility].   Specialty: Urgent Care Why: As needed. Contact information: [address] [place] [address] [phone]           Discharge Instructions     You have been tested for COVID-19 today. If your test returns positive, you will receive a phone call from [facility] regarding your results. Negative test results are not called. Both positive and negative results area always visible on MyChart. If you do not have a MyChart account, sign up instructions are provided in your discharge papers. Please do not hesitate to contact us should you have questions or concerns.   Please do your best to ensure adequate fluid intake in order to avoid dehydration. If you find that you are unable to tolerate drinking fluids regularly please proceed to the Emergency Department for evaluation.        Reviewed expectations re: course of current medical issues. Questions answered. Outlined signs and symptoms indicating need for more acute intervention. Patient verbalized understanding. After Visit Summary given.   SUBJECTIVE: History from: patient. [name] Hendrix is a 34 y.o. female who requests COVID-19 testing. Known COVID-19 contact: none. Recent travel: none. Denies: difficulty breathing and headache. Does report subjective fever with chills in addition to body  aches and nasal congestion for a few days. Overall decreased PO intake. Mild nausea without emesis. No abdominal pain.  ROS: As per HPI.   OBJECTIVE:  Vitals:   05/16/19 1946  Weight: 57 kg    General appearance: alert; no distress Eyes: PERRLA; EOMI; conjunctiva normal HENT: [place]; AT; nasal mucosa normal; oral mucosa normal Neck: supple  Lungs: speaks full sentences without difficulty; unlabored Extremities: no edema Skin: warm and dry Neurologic: normal gait Psychological: alert and cooperative; normal mood and affect  Labs:  Labs Reviewed  NOVEL CORONAVIRUS, NAA (HOSP ORDER, SEND-OUT TO REF LAB; TAT 18-24 HRS)     Allergies  Allergen Reactions  . Hydromet [Hydrocodone-Homatropine] Hives    Past Medical History:  Diagnosis Date  . Hypertension   . Kidney stones   . Placenta previa    Social History   Socioeconomic History  . Marital status: Single    Spouse name: Not on file  . Number of children: Not on file  . Years of education: Not on file  . Highest education level: Not on file  Occupational History  . Not on file  Tobacco Use  . Smoking status: Former Smoker    Packs/day: 0.25    Years: 14.00    Pack years: 3.50    Types: Cigarettes  . Smokeless tobacco: Former Engineer, water and Sexual Activity  . Alcohol use: No  . Drug use: No  . Sexual activity: Yes  Other Topics Concern  . Not on file  Social History  Narrative  . Not on file   Social Determinants of Health   Financial Resource Strain:   . Difficulty of Paying Living Expenses: Not on file  Food Insecurity:   . Worried About Charity fundraiser in the Last Year: Not on file  . Ran Out of Food in the Last Year: Not on file  Transportation Needs:   . Lack of Transportation (Medical): Not on file  . Lack of Transportation (Non-Medical): Not on file  Physical Activity:   . Days of Exercise per Week: Not on file  . Minutes of Exercise per Session: Not on file  Stress:   . Feeling of  Stress : Not on file  Social Connections:   . Frequency of Communication with Friends and Family: Not on file  . Frequency of Social Gatherings with Friends and Family: Not on file  . Attends Religious Services: Not on file  . Active Member of Clubs or Organizations: Not on file  . Attends Archivist Meetings: Not on file  . Marital Status: Not on file  Intimate Partner Violence:   . Fear of Current or Ex-Partner: Not on file  . Emotionally Abused: Not on file  . Physically Abused: Not on file  . Sexually Abused: Not on file   Family History  Problem Relation Age of Onset  . Diabetes Maternal Grandfather   . Hypertension Maternal Grandfather    Past Surgical History:  Procedure Laterality Date  . CESAREAN SECTION    . CESAREAN SECTION N/A 06/18/2013   Procedure: CESAREAN SECTION;  Surgeon: Frederico Hamman, MD;  Location: [place] [facility];  Service: Obstetrics;  Laterality: N/A;  . CESAREAN SECTION WITH BILATERAL TUBAL LIGATION Bilateral 06/18/2013   Procedure: REPEAT CESAREAN SECTION WITH BILATERAL TUBAL LIGATION;  Surgeon: Frederico Hamman, MD;  Location: [place] [facility];  Service: Obstetrics;  Laterality: Bilateral;     [name], MD 05/16/19 [name]    [name], MD 05/16/19 2003

## 2019-05-16 NOTE — ED Triage Notes (Signed)
Pt states her symptoms started a few days ago, she is here with body aches/ chills/ fever/ congestion. Wants COVID testing.

## 2019-05-18 LAB — NOVEL CORONAVIRUS, NAA (HOSP ORDER, SEND-OUT TO REF LAB; TAT 18-24 HRS): SARS-CoV-2, NAA: NOT DETECTED

## 2019-06-09 ENCOUNTER — Emergency Department (HOSPITAL_COMMUNITY): Payer: Medicaid Other

## 2019-06-09 ENCOUNTER — Other Ambulatory Visit: Payer: Self-pay

## 2019-06-09 ENCOUNTER — Emergency Department (HOSPITAL_COMMUNITY)
Admission: EM | Admit: 2019-06-09 | Discharge: 2019-06-09 | Disposition: A | Discharge status: Home / Self Care | Payer: Medicaid Other | Attending: Emergency Medicine | Admitting: Emergency Medicine

## 2019-06-09 ENCOUNTER — Encounter (HOSPITAL_COMMUNITY): Payer: Self-pay | Admitting: Emergency Medicine

## 2019-06-09 DIAGNOSIS — I10 Essential (primary) hypertension: Secondary | ICD-10-CM | POA: Insufficient documentation

## 2019-06-09 DIAGNOSIS — Z87891 Personal history of nicotine dependence: Secondary | ICD-10-CM | POA: Diagnosis not present

## 2019-06-09 DIAGNOSIS — R10811 Right upper quadrant abdominal tenderness: Secondary | ICD-10-CM | POA: Insufficient documentation

## 2019-06-09 DIAGNOSIS — N12 Tubulo-interstitial nephritis, not specified as acute or chronic: Secondary | ICD-10-CM

## 2019-06-09 DIAGNOSIS — R1031 Right lower quadrant pain: Secondary | ICD-10-CM | POA: Diagnosis present

## 2019-06-09 DIAGNOSIS — N10 Acute tubulo-interstitial nephritis: Secondary | ICD-10-CM | POA: Insufficient documentation

## 2019-06-09 DIAGNOSIS — R05 Cough: Secondary | ICD-10-CM | POA: Insufficient documentation

## 2019-06-09 DIAGNOSIS — Z87442 Personal history of urinary calculi: Secondary | ICD-10-CM | POA: Insufficient documentation

## 2019-06-09 LAB — CBC
HCT: 35.5 % — ABNORMAL LOW (ref 36.0–46.0)
Hemoglobin: 11.4 g/dL — ABNORMAL LOW (ref 12.0–15.0)
MCH: 31 pg (ref 26.0–34.0)
MCHC: 32.1 g/dL (ref 30.0–36.0)
MCV: 96.5 fL (ref 80.0–100.0)
Platelets: 388 10*3/uL (ref 150–400)
RBC: 3.68 MIL/uL — ABNORMAL LOW (ref 3.87–5.11)
RDW: 13.2 % (ref 11.5–15.5)
WBC: 8 10*3/uL (ref 4.0–10.5)
nRBC: 0 % (ref 0.0–0.2)

## 2019-06-09 LAB — URINALYSIS, ROUTINE W REFLEX MICROSCOPIC
Bilirubin Urine: NEGATIVE
Glucose, UA: NEGATIVE mg/dL
Hgb urine dipstick: NEGATIVE
Ketones, ur: NEGATIVE mg/dL
Nitrite: NEGATIVE
Protein, ur: NEGATIVE mg/dL
Specific Gravity, Urine: 1.017 (ref 1.005–1.030)
pH: 6 (ref 5.0–8.0)

## 2019-06-09 LAB — COMPREHENSIVE METABOLIC PANEL
ALT: 49 U/L — ABNORMAL HIGH (ref 0–44)
AST: 46 U/L — ABNORMAL HIGH (ref 15–41)
Albumin: 3 g/dL — ABNORMAL LOW (ref 3.5–5.0)
Alkaline Phosphatase: 92 U/L (ref 38–126)
Anion gap: 7 (ref 5–15)
BUN: 5 mg/dL — ABNORMAL LOW (ref 6–20)
CO2: 27 mmol/L (ref 22–32)
Calcium: 8.7 mg/dL — ABNORMAL LOW (ref 8.9–10.3)
Chloride: 102 mmol/L (ref 98–111)
Creatinine, Ser: 0.84 mg/dL (ref 0.44–1.00)
GFR calc Af Amer: >60 mL/min (ref >60)
GFR calc non Af Amer: >60 mL/min (ref >60)
Glucose, Bld: 97 mg/dL (ref 70–99)
Potassium: 4.1 mmol/L (ref 3.5–5.1)
Sodium: 136 mmol/L (ref 135–145)
Total Bilirubin: 0.4 mg/dL (ref 0.3–1.2)
Total Protein: 7.3 g/dL (ref 6.5–8.1)

## 2019-06-09 LAB — LIPASE, BLOOD: Lipase: 20 U/L (ref 11–51)

## 2019-06-09 LAB — I-STAT BETA HCG BLOOD, ED (MC, WL, AP ONLY): I-stat hCG, quantitative: <5 m[IU]/mL (ref <5)

## 2019-06-09 MED ORDER — ALBUTEROL SULFATE HFA 108 (90 BASE) MCG/ACT IN AERS: 2.0000 | INHALATION_SPRAY | RESPIRATORY_TRACT | 0 refills | Status: AC | PRN

## 2019-06-09 MED ORDER — SULFAMETHOXAZOLE-TRIMETHOPRIM 800-160 MG PO TABS: 1.0000 | ORAL_TABLET | Freq: Two times a day (BID) | ORAL | 0 refills | Status: AC

## 2019-06-09 MED ORDER — TRAMADOL HCL 50 MG PO TABS: 50.0000 mg | ORAL_TABLET | Freq: Four times a day (QID) | ORAL | 0 refills | Status: DC | PRN

## 2019-06-09 MED ORDER — SODIUM CHLORIDE 0.9 % IV BOLUS
1000.0000 mL | Freq: Once | INTRAVENOUS | Status: AC
  Administered 2019-06-09: 18:00:00 1000 mL via INTRAVENOUS

## 2019-06-09 MED ORDER — SODIUM CHLORIDE 0.9% FLUSH: 3.0000 mL | Freq: Once | INTRAVENOUS | Status: DC

## 2019-06-09 MED ORDER — IOHEXOL 300 MG/ML  SOLN
100.0000 mL | Freq: Once | INTRAMUSCULAR | Status: AC | PRN
  Administered 2019-06-09: 100 mL via INTRAVENOUS

## 2019-06-09 MED ORDER — PROMETHAZINE HCL 25 MG PO TABS: 25.0000 mg | ORAL_TABLET | Freq: Four times a day (QID) | ORAL | 0 refills | Status: DC | PRN

## 2019-06-09 MED ORDER — AEROCHAMBER PLUS FLO-VU MEDIUM MISC: 1.0000 | Freq: Once | 0 refills | Status: AC

## 2019-06-09 NOTE — ED Triage Notes (Signed)
Pt c/o cough and worsening RLQ pain x 1 week. Denies urinary frequency or pain with urination, but reports she had a streak of blood in her urine. States she had a negative covid test.

## 2019-06-09 NOTE — ED Notes (Signed)
Patient back from radiology.

## 2019-06-09 NOTE — ED Provider Notes (Signed)
[facility] EMERGENCY DEPARTMENT Provider Note   CSN: [id number] Arrival date & time: 06/09/19  1721     History Chief Complaint  Patient presents with  . Cough  . Abdominal Pain    [name] Hendrix is a 34 y.o. female.  HPI Patient presents to the emergency department with right-sided pain in her abdomen that started about 2 weeks ago.  She states she had a cough for about a week.  Patient states that she is had no fevers but several episodes of vomiting.  The patient states that the pain in her abdomen is worse with palpation.  Patient states that she did not take any medications prior to arrival for her symptoms.  The patient denies chest pain, shortness of breath, headache,blurred vision, neck pain, fever, cough, weakness, numbness, dizziness, anorexia, edema, rash, back pain, dysuria, hematemesis, bloody stool, near syncope, or syncope. The patient states she did have a loose stool earlier today.  Past Medical History:  Diagnosis Date  . Hypertension   . Kidney stones   . Placenta previa     Patient Active Problem List   Diagnosis Date Noted  . S/P cesarean section 06/18/2013  . Placenta previa antepartum in third trimester 05/03/2013  . Placenta previa 04/19/2013  . Vaginal bleeding in pregnancy 03/25/2013    Past Surgical History:  Procedure Laterality Date  . CESAREAN SECTION    . CESAREAN SECTION N/A 06/18/2013   Procedure: CESAREAN SECTION;  Surgeon: Frederico Hamman, MD;  Location: [facility];  Service: Obstetrics;  Laterality: N/A;  . CESAREAN SECTION WITH BILATERAL TUBAL LIGATION Bilateral 06/18/2013   Procedure: REPEAT CESAREAN SECTION WITH BILATERAL TUBAL LIGATION;  Surgeon: Frederico Hamman, MD;  Location: [facility];  Service: Obstetrics;  Laterality: Bilateral;     OB History    Gravida  4   Para  3   Term  2   Preterm  1   AB  1   Living  3     SAB  1   TAB      Ectopic      Multiple      Live Births  3            Family History  Problem Relation Age of Onset  . Diabetes Maternal Grandfather   . Hypertension Maternal Grandfather     Social History   Tobacco Use  . Smoking status: Former Smoker    Packs/day: 0.25    Years: 14.00    Pack years: 3.50    Types: Cigarettes  . Smokeless tobacco: Former Network engineer Use Topics  . Alcohol use: No  . Drug use: No    Home Medications Prior to Admission medications   Medication Sig Start Date End Date Taking? Authorizing Provider  acetaminophen (TYLENOL) 500 MG tablet Take 500 mg by mouth every 6 (six) hours as needed for mild pain or fever.    Yes [provider]  naproxen sodium (ALEVE) 220 MG tablet Take 220 mg by mouth 2 (two) times daily as needed (for headache).   Yes [provider]  ibuprofen (ADVIL,MOTRIN) 800 MG tablet Take 1 tablet (800 mg total) by mouth 3 (three) times daily. Patient not taking: Reported on 12/17/2017 07/19/17   Wende Mott, CNM  metroNIDAZOLE (FLAGYL) 500 MG tablet Take 1 tablet (500 mg total) by mouth 2 (two) times daily. Patient not taking: Reported on 12/17/2017 07/19/17   Wende Mott, CNM  ondansetron (ZOFRAN-ODT) 4 MG  disintegrating tablet Take 1 tablet (4 mg total) by mouth every 8 (eight) hours as needed for nausea or vomiting. Patient not taking: Reported on 06/09/2019 05/16/19   Mardella Layman, MD    Allergies    Hydromet [hydrocodone-homatropine]  Review of Systems   Review of Systems All other systems negative except as documented in the HPI. All pertinent positives and negatives as reviewed in the HPI. Physical Exam Updated Vital Signs BP 99/69   Pulse 94   Temp 98.4 F (36.9 C) (Oral)   Resp 17   LMP 05/17/2019   SpO2 100%   Physical Exam Vitals and nursing note reviewed.  Constitutional:      General: She is not in acute distress.    Appearance: She is well-developed.  HENT:     Head: Normocephalic and atraumatic.  Eyes:     Pupils: Pupils are equal,  round, and reactive to light.  Cardiovascular:     Rate and Rhythm: Normal rate and regular rhythm.     Heart sounds: Normal heart sounds. No murmur. No friction rub. No gallop.   Pulmonary:     Effort: Pulmonary effort is normal. No respiratory distress.     Breath sounds: Normal breath sounds. No wheezing.  Abdominal:     General: Bowel sounds are normal. There is no distension.     Palpations: Abdomen is soft.     Tenderness: There is abdominal tenderness in the right upper quadrant and right lower quadrant.  Musculoskeletal:     Cervical back: Normal range of motion and neck supple.  Skin:    General: Skin is warm and dry.     Capillary Refill: Capillary refill takes less than 2 seconds.     Findings: No erythema or rash.  Neurological:     Mental Status: She is alert and oriented to person, place, and time.     Motor: No abnormal muscle tone.     Coordination: Coordination normal.  Psychiatric:        Behavior: Behavior normal.     ED Results / Procedures / Treatments   Labs (all labs ordered are listed, but only abnormal results are displayed) Labs Reviewed  COMPREHENSIVE METABOLIC PANEL - Abnormal; Notable for the following components:      Result Value   BUN 5 (*)    Calcium 8.7 (*)    Albumin 3.0 (*)    AST 46 (*)    ALT 49 (*)    All other components within normal limits  CBC - Abnormal; Notable for the following components:   RBC 3.68 (*)    Hemoglobin 11.4 (*)    HCT 35.5 (*)    All other components within normal limits  URINALYSIS, ROUTINE W REFLEX MICROSCOPIC - Abnormal; Notable for the following components:   APPearance HAZY (*)    Leukocytes,Ua TRACE (*)    Bacteria, UA MANY (*)    All other components within normal limits  URINE CULTURE  LIPASE, BLOOD  I-STAT BETA HCG BLOOD, ED ([facility], [facility], [facility] ONLY)    EKG None  Radiology US Abdomen Complete  Addendum Date: 06/09/2019   ADDENDUM REPORT: 06/09/2019 20:47 ADDENDUM: The indeterminate 1.4 cm  structure arising from the right kidney seen on recent CT appears to correspond to a simple cyst. No further follow-up is required. Electronically Signed   By: Katherine Mantle M.D.   On: 06/09/2019 20:47   Result Date: 06/09/2019 CLINICAL DATA:  Right lower quadrant abdominal pain. EXAM: ABDOMEN ULTRASOUND COMPLETE  COMPARISON:  CT from same day. FINDINGS: Gallbladder: No gallstones or wall thickening visualized. No sonographic Murphy sign noted by sonographer. Common bile duct: Diameter: 3 mm Liver: No focal lesion identified. Within normal limits in parenchymal echogenicity. Portal vein is patent on color Doppler imaging with normal direction of blood flow towards the liver. IVC: No abnormality visualized. Pancreas: Visualized portion unremarkable. Spleen: Size and appearance within normal limits. Right Kidney: Length: 12.3 x 4.7 x 5.3 cm. There is a heterogeneous appearance of the right renal cortex without evidence for hydronephrosis. The dilated ureter seen on recent CT is not well appreciated on this exam. Left Kidney: Length: 8.1 cm. The left kidney is poorly evaluated secondary to overlying bowel gas. There is no frank hydronephrosis. Abdominal aorta: No aneurysm visualized. Other findings: None. IMPRESSION: 1. Heterogeneous appearance of the right kidney as seen on recent CT, consistent with right-sided pyelonephritis in the appropriate clinical setting. 2. No other acute abnormality. No evidence for acute cholecystitis. No hydronephrosis. Electronically Signed: By: Katherine Mantle M.D. On: 06/09/2019 20:36   CT Abdomen Pelvis W Contrast  Addendum Date: 06/09/2019   ADDENDUM REPORT: 06/09/2019 20:34 ADDENDUM: In the impression of the report, item #6 should state indeterminate 1.4 cm renal nodule, not thyroid nodule. Electronically Signed   By: Katherine Mantle M.D.   On: 06/09/2019 20:34   Result Date: 06/09/2019 CLINICAL DATA:  Abdominal pain. Right lower quadrant pain. Questionable  hematuria. EXAM: CT ABDOMEN AND PELVIS WITH CONTRAST TECHNIQUE: Multidetector CT imaging of the abdomen and pelvis was performed using the standard protocol following bolus administration of intravenous contrast. CONTRAST:  OMNIPAQUE IOHEXOL 300 MG/ML  SOLN COMPARISON:  Sep 02, 2012 FINDINGS: Lower chest: There is a stable subpleural nodule along the peripheral left lower lobe (axial series 5, image 19).The heart size is normal. Hepatobiliary: The liver is normal. Normal gallbladder.There is no biliary ductal dilation. Pancreas: Normal contours without ductal dilatation. No peripancreatic fluid collection. Spleen: No splenic laceration or hematoma. Adrenals/Urinary Tract: --Adrenal glands: No adrenal hemorrhage. --Right kidney/ureter: There is a striated appearance of the right renal cortex without evidence for hydronephrosis. The right ureter appears chronically dilated as seen on prior study from 2014. There is indeterminate 1.4 cm hypoattenuating nodule in the upper pole the right kidney measuring approximately 39 Hounsfield units (axial series 3, image 19). --Left kidney/ureter: There is a striated appearance of the left renal cortex without evidence for hydronephrosis. There is a nonobstructing stone in the lower pole the left kidney. The left ureter proximally appears to be dilated, similar to prior study. --Urinary bladder: There is mild wall thickening of the urinary bladder with adjacent fat stranding. Stomach/Bowel: --Stomach/Duodenum: No hiatal hernia or other gastric abnormality. Normal duodenal course and caliber. --Small bowel: No dilatation or inflammation. --Colon: No focal abnormality. --Appendix: The appendix is unremarkable (axial series 3, image 46). Vascular/Lymphatic: Normal course and caliber of the major abdominal vessels. --No retroperitoneal lymphadenopathy. --No mesenteric lymphadenopathy. --No pelvic or inguinal lymphadenopathy. Reproductive: There are 2 cystic structures involving  the left ovary measuring approximately 2.7 and 2.8 cm. Given their size, no further follow-up is required. Other: There is a small volume of pelvic free fluid which is likely physiologic. No free air. the abdominal wall is normal. Musculoskeletal. No acute displaced fractures. IMPRESSION: 1. Striated appearance of the bilateral renal cortex is suggestive of pyelonephritis in the proper clinical setting. 2. There is mild wall thickening of the urinary bladder with adjacent fat stranding, suggestive of cystitis. 3. There  is a small 2-3 mm nonobstructing stone in the lower pole the left kidney. 4. Normal appendix. 5. Chronically dilated right ureter of unknown clinical significance. There is no evidence for hydronephrosis. There is no evidence for an obstructing stone. 6. Indeterminate 1.4 cm thyroid nodule in the upper pole of the right kidney. This is favored to represent a hemorrhagic or proteinaceous cyst but is incompletely characterized on this exam. Follow-up with a nonemergent outpatient renal ultrasound is recommended for further evaluation of this finding. Electronically Signed: By: Katherine Mantle M.D. On: 06/09/2019 19:21    Procedures Procedures (including critical care time)  Medications Ordered in ED Medications  sodium chloride flush (NS) 0.9 % injection 3 mL (3 mLs Intravenous Not Given 06/09/19 1923)  sodium chloride 0.9 % bolus 1,000 mL (0 mLs Intravenous Stopped 06/09/19 1922)  iohexol (OMNIPAQUE) 300 MG/ML solution 100 mL (100 mLs Intravenous Contrast Given 06/09/19 1902)    ED Course  I have reviewed the triage vital signs and the nursing notes.  Pertinent labs & imaging results that were available during my care of the patient were reviewed by me and considered in my medical decision making (see chart for details).    MDM Rules/Calculators/A&P                      Is on the patient's findings on her laboratory testing and CT scan and ultrasound imaging I feel that she does  have a kidney infection.  Patient be treated for this.  The cough is very mild and she states not very consistent.  I will give her antibiotic treatment and told her to increase her fluid intake.  She is given a liter of fluid here in the emergency department.  Have advised her to follow-up with her primary doctor.  Patient agrees this plan and all questions were answered.  The patient's vital signs have been normal for her.  Her tachycardia has improved with the fluids.  Patient has tolerated oral fluids as well. Final Clinical Impression(s) / ED Diagnoses Final diagnoses:  None    Rx / DC Orders ED Discharge Orders    None       Kyra Manges 06/09/19 2107    Arby Barrette, MD 06/21/19 1332

## 2019-06-09 NOTE — ED Notes (Signed)
Patient transported to CT 

## 2019-06-09 NOTE — ED Notes (Signed)
Patient to ultrasound

## 2019-06-09 NOTE — Discharge Instructions (Addendum)
Your testing indicates that you have a kidney infection that is most likely causing your pain.  There is no signs of any kidney stones outside of the kidneys at this time.  Return here for any worsening in your condition.  Increase your fluid intake.  Rest as much as possible.

## 2019-06-11 LAB — URINE CULTURE: Culture: 40000 — AB

## 2019-06-12 ENCOUNTER — Telehealth: Payer: Self-pay

## 2019-06-12 NOTE — Telephone Encounter (Signed)
Post ED Visit - Positive Culture Follow-up  Culture report reviewed by antimicrobial stewardship pharmacist: [facility] []  , Pharm.D. []  Enzo Bi, Pharm.D., BCPS AQ-ID []  , Pharm.D., BCPS []  Celedonio Miyamoto, .D., BCPS []  [place], .D., BCPS, AAHIVP []  Georgina Pillion, Pharm.D., BCPS, AAHIVP []  [address], PharmD, BCPS []  , PharmD, BCPS []  [place], PharmD, BCPS []  [place], PharmD []  , PharmD, BCPS []  Estella Husk, PharmD [facility] []  Lysle Pearl, PharmD []  , PharmD []  Phillips Climes, PharmD []  , Rph []  Agapito Games) , PharmD []  Verlan Friends, PharmD []  , PharmD []  Mervyn Gay, PharmD []  , PharmD []  Vinnie Level, PharmD []  Bufford Lope, PharmD []  , PharmD []  Len Childs, PharmD   Positive urine culture Treated with Bactrim DS, organism sensitive to the same and no further patient follow-up is required at this time.  06/12/2019, 10:21 AM

## 2019-08-19 ENCOUNTER — Emergency Department (HOSPITAL_COMMUNITY): Payer: Medicaid Other

## 2019-08-19 ENCOUNTER — Other Ambulatory Visit: Payer: Self-pay

## 2019-08-19 ENCOUNTER — Ambulatory Visit (HOSPITAL_COMMUNITY)
Admission: EM | Admit: 2019-08-19 | Discharge: 2019-08-19 | Disposition: A | Discharge status: Home / Self Care | Payer: Medicaid Other | Attending: Family Medicine | Admitting: Family Medicine

## 2019-08-19 ENCOUNTER — Emergency Department (HOSPITAL_COMMUNITY)
Admission: EM | Admit: 2019-08-19 | Discharge: 2019-08-19 | Disposition: A | Discharge status: Home / Self Care | Payer: Medicaid Other | Attending: Emergency Medicine | Admitting: Emergency Medicine

## 2019-08-19 DIAGNOSIS — R102 Pelvic and perineal pain: Secondary | ICD-10-CM | POA: Diagnosis not present

## 2019-08-19 DIAGNOSIS — R1032 Left lower quadrant pain: Secondary | ICD-10-CM

## 2019-08-19 DIAGNOSIS — N924 Excessive bleeding in the premenopausal period: Secondary | ICD-10-CM | POA: Diagnosis not present

## 2019-08-19 DIAGNOSIS — Z3202 Encounter for pregnancy test, result negative: Secondary | ICD-10-CM

## 2019-08-19 DIAGNOSIS — I10 Essential (primary) hypertension: Secondary | ICD-10-CM | POA: Diagnosis not present

## 2019-08-19 DIAGNOSIS — Z87891 Personal history of nicotine dependence: Secondary | ICD-10-CM | POA: Diagnosis not present

## 2019-08-19 DIAGNOSIS — N939 Abnormal uterine and vaginal bleeding, unspecified: Secondary | ICD-10-CM | POA: Diagnosis not present

## 2019-08-19 LAB — URINALYSIS, ROUTINE W REFLEX MICROSCOPIC
Bacteria, UA: NONE SEEN
Bilirubin Urine: NEGATIVE
Glucose, UA: NEGATIVE mg/dL
Ketones, ur: NEGATIVE mg/dL
Nitrite: NEGATIVE
Protein, ur: NEGATIVE mg/dL
Specific Gravity, Urine: 1.023 (ref 1.005–1.030)
pH: 6 (ref 5.0–8.0)

## 2019-08-19 LAB — CBC WITH DIFFERENTIAL/PLATELET
Abs Immature Granulocytes: 0.01 10*3/uL (ref 0.00–0.07)
Basophils Absolute: 0 10*3/uL (ref 0.0–0.1)
Basophils Relative: 1 %
Eosinophils Absolute: 0.1 10*3/uL (ref 0.0–0.5)
Eosinophils Relative: 2 %
HCT: 34.5 % — ABNORMAL LOW (ref 36.0–46.0)
Hemoglobin: 10.4 g/dL — ABNORMAL LOW (ref 12.0–15.0)
Immature Granulocytes: 0 %
Lymphocytes Relative: 49 %
Lymphs Abs: 2.9 10*3/uL (ref 0.7–4.0)
MCH: 28.7 pg (ref 26.0–34.0)
MCHC: 30.1 g/dL (ref 30.0–36.0)
MCV: 95.3 fL (ref 80.0–100.0)
Monocytes Absolute: 0.4 10*3/uL (ref 0.1–1.0)
Monocytes Relative: 8 %
Neutro Abs: 2.3 10*3/uL (ref 1.7–7.7)
Neutrophils Relative %: 40 %
Platelets: 494 10*3/uL — ABNORMAL HIGH (ref 150–400)
RBC: 3.62 MIL/uL — ABNORMAL LOW (ref 3.87–5.11)
RDW: 14 % (ref 11.5–15.5)
WBC: 5.7 10*3/uL (ref 4.0–10.5)
nRBC: 0 % (ref 0.0–0.2)

## 2019-08-19 LAB — COMPREHENSIVE METABOLIC PANEL WITH GFR
ALT: 16 U/L (ref 0–44)
AST: 21 U/L (ref 15–41)
Albumin: 4.9 g/dL (ref 3.5–5.0)
Alkaline Phosphatase: 74 U/L (ref 38–126)
Anion gap: 10 (ref 5–15)
BUN: 10 mg/dL (ref 6–20)
CO2: 24 mmol/L (ref 22–32)
Calcium: 10.1 mg/dL (ref 8.9–10.3)
Chloride: 106 mmol/L (ref 98–111)
Creatinine, Ser: 0.85 mg/dL (ref 0.44–1.00)
GFR calc Af Amer: >60 mL/min
GFR calc non Af Amer: >60 mL/min
Glucose, Bld: 121 mg/dL — ABNORMAL HIGH (ref 70–99)
Potassium: 3.8 mmol/L (ref 3.5–5.1)
Sodium: 140 mmol/L (ref 135–145)
Total Bilirubin: 0.5 mg/dL (ref 0.3–1.2)
Total Protein: 8.8 g/dL — ABNORMAL HIGH (ref 6.5–8.1)

## 2019-08-19 LAB — WET PREP, GENITAL
Sperm: NONE SEEN
Trich, Wet Prep: NONE SEEN
WBC, Wet Prep HPF POC: NONE SEEN
Yeast Wet Prep HPF POC: NONE SEEN

## 2019-08-19 LAB — POCT URINALYSIS DIP (DEVICE)
Bilirubin Urine: NEGATIVE
Glucose, UA: NEGATIVE mg/dL
Leukocytes,Ua: NEGATIVE
Nitrite: NEGATIVE
Protein, ur: NEGATIVE mg/dL
Specific Gravity, Urine: >=1.03 (ref 1.005–1.030)
Urobilinogen, UA: 0.2 mg/dL (ref 0.0–1.0)
pH: 5.5 (ref 5.0–8.0)

## 2019-08-19 LAB — PREGNANCY, URINE: Preg Test, Ur: NEGATIVE

## 2019-08-19 LAB — POC URINE PREG, ED: Preg Test, Ur: NEGATIVE

## 2019-08-19 MED ORDER — SODIUM CHLORIDE 0.9 % IV BOLUS
1000.0000 mL | Freq: Once | INTRAVENOUS | Status: AC
  Administered 2019-08-19: 1000 mL via INTRAVENOUS

## 2019-08-19 MED ORDER — NORGESTIM-ETH ESTRAD TRIPHASIC 0.18/0.215/0.25 MG-25 MCG PO TABS
1.0000 | ORAL_TABLET | Freq: Every day | ORAL | 0 refills | Status: DC
Start: 2019-08-19 — End: 2020-06-24

## 2019-08-19 NOTE — ED Notes (Signed)
Family provided sandwiches, crackers, and juice.

## 2019-08-19 NOTE — Discharge Instructions (Signed)
Take Ortho Tri-Cyclen to help you with your period.   Stay hydrated.  See GYN for follow-up.  Return to ER if you have severe pelvic pain, bleeding, vomiting, fever.

## 2019-08-19 NOTE — ED Notes (Signed)
Ultrasound at bedside

## 2019-08-19 NOTE — ED Provider Notes (Signed)
[facility]-EMERGENCY DEPT Provider Note   CSN: [id number] Arrival date & time: 08/19/19  1338     History Chief Complaint  Patient presents with  . Pelvic Pain  . Abdominal Pain    [name] Hendrix is a 34 y.o. female history of hypertension, here presenting with pelvic pain and vaginal bleeding .  Patient states that about a week ago, she started spotting and she thought she has her period. She states that since then, she has been having large clots and started having lower pelvic pain since this morning .  She states that it is worse in the left side and she went to urgent care and she was sent here for possible torsion versus ruptured cyst versus ectopic.  Patient states that she does not know if she is pregnant or not.   The history is provided by the patient.       Past Medical History:  Diagnosis Date  . Hypertension   . Kidney stones   . Placenta previa     Patient Active Problem List   Diagnosis Date Noted  . S/P cesarean section 06/18/2013  . Placenta previa antepartum in third trimester 05/03/2013  . Placenta previa 04/19/2013  . Vaginal bleeding in pregnancy 03/25/2013    Past Surgical History:  Procedure Laterality Date  . CESAREAN SECTION    . CESAREAN SECTION N/A 06/18/2013   Procedure: CESAREAN SECTION;  Surgeon: [name], MD;  Location: [facility];  Service: Obstetrics;  Laterality: N/A;  . CESAREAN SECTION WITH BILATERAL TUBAL LIGATION Bilateral 06/18/2013   Procedure: REPEAT CESAREAN SECTION WITH BILATERAL TUBAL LIGATION;  Surgeon: [name], MD;  Location: [facility];  Service: Obstetrics;  Laterality: Bilateral;     OB History    Gravida  4   Para  3   Term  2   Preterm  1   AB  1   Living  3     SAB  1   TAB      Ectopic      Multiple      Live Births  3           Family History  Problem Relation Age of Onset  . Diabetes Maternal Grandfather   . Hypertension Maternal Grandfather      Social History   Tobacco Use  . Smoking status: Former Smoker    Packs/day: 0.25    Years: 14.00    Pack years: 3.50    Types: Cigarettes  . Smokeless tobacco: Former Engineer, water Use Topics  . Alcohol use: No  . Drug use: No    Home Medications Prior to Admission medications   Medication Sig Start Date End Date Taking? Authorizing Provider  albuterol (VENTOLIN HFA) 108 (90 Base) MCG/ACT inhaler Inhale 2 puffs into the lungs every 4 (four) hours as needed for wheezing or shortness of breath. 06/09/19  Yes Lawyer, Cristal Deer, PA-C  ondansetron (ZOFRAN-ODT) 4 MG disintegrating tablet Take 1 tablet (4 mg total) by mouth every 8 (eight) hours as needed for nausea or vomiting. Patient not taking: Reported on 06/09/2019 05/16/19   Mardella Layman, MD  promethazine (PHENERGAN) 25 MG tablet Take 1 tablet (25 mg total) by mouth every 6 (six) hours as needed for nausea or vomiting. Patient not taking: Reported on 08/19/2019 06/09/19   Charlestine Night, PA-C  traMADol (ULTRAM) 50 MG tablet Take 1 tablet (50 mg total) by mouth every 6 (six) hours as needed for severe pain. Patient  not taking: Reported on 08/19/2019 06/09/19   Dalia Heading, PA-C    Allergies    Hydromet [hydrocodone-homatropine]  Review of Systems   Review of Systems  Gastrointestinal: Positive for abdominal pain.  Genitourinary: Positive for pelvic pain.  All other systems reviewed and are negative.   Physical Exam Updated Vital Signs BP 104/61 (BP Location: Left Arm)   Pulse (!) 58   Temp 99 F (37.2 C) (Oral)   Resp 16   Ht 5' (1.524 m)   Wt 55 kg   LMP 08/05/2019 (Exact Date)   SpO2 100%   BMI 23.68 kg/m   Physical Exam Vitals and nursing note reviewed.  HENT:     Head: Normocephalic.     Mouth/Throat:     Mouth: Mucous membranes are moist.  Eyes:     Extraocular Movements: Extraocular movements intact.  Cardiovascular:     Rate and Rhythm: Normal rate and regular rhythm.     Heart  sounds: Normal heart sounds.  Pulmonary:     Effort: Pulmonary effort is normal.     Breath sounds: Normal breath sounds.  Abdominal:     General: Abdomen is flat.     Palpations: Abdomen is soft.  Genitourinary:    Comments: Os is closed and there is some blood in the vaginal vault with minimal clots.  She has mild left adnexal tenderness on exam.  Skin:    General: Skin is warm.     Capillary Refill: Capillary refill takes less than 2 seconds.  Neurological:     General: No focal deficit present.     Mental Status: She is alert and oriented to person, place, and time.  Psychiatric:        Behavior: Behavior normal.     ED Results / Procedures / Treatments   Labs (all labs ordered are listed, but only abnormal results are displayed) Labs Reviewed  WET PREP, GENITAL - Abnormal; Notable for the following components:      Result Value   Clue Cells Wet Prep HPF POC PRESENT (*)    All other components within normal limits  URINALYSIS, ROUTINE W REFLEX MICROSCOPIC - Abnormal; Notable for the following components:   Hgb urine dipstick LARGE (*)    Leukocytes,Ua TRACE (*)    All other components within normal limits  CBC WITH DIFFERENTIAL/PLATELET - Abnormal; Notable for the following components:   RBC 3.62 (*)    Hemoglobin 10.4 (*)    HCT 34.5 (*)    Platelets 494 (*)    All other components within normal limits  COMPREHENSIVE METABOLIC PANEL - Abnormal; Notable for the following components:   Glucose, Bld 121 (*)    Total Protein 8.8 (*)    All other components within normal limits  PREGNANCY, URINE  GC/CHLAMYDIA PROBE AMP ([place]) NOT AT [facility]    EKG None  Radiology US PELVIC COMPLETE W TRANSVAGINAL AND TORSION R/O  Result Date: 08/19/2019 CLINICAL DATA:  Left adnexal tenderness EXAM: TRANSABDOMINAL AND TRANSVAGINAL ULTRASOUND OF PELVIS DOPPLER ULTRASOUND OF OVARIES TECHNIQUE: Both transabdominal and transvaginal ultrasound examinations of the pelvis were  performed. Transabdominal technique was performed for global imaging of the pelvis including uterus, ovaries, adnexal regions, and pelvic cul-de-sac. It was necessary to proceed with endovaginal exam following the transabdominal exam to visualize the uterus endometrium adnexa. Color and duplex Doppler ultrasound was utilized to evaluate blood flow to the ovaries. COMPARISON:  CT 06/09/2019 FINDINGS: Uterus Measurements: 10.7 x 6 x 6.5 cm = volume:  216.3 mL. No fibroids or other mass visualized. Endometrium Thickness: 8.5 mm.  No focal abnormality visualized. Right ovary Measurements: 3.8 x 2.3 x 3.1 cm = volume: 13.9 mL. Normal appearance/no adnexal mass. Left ovary Measurements: 3.4 x 1.5 x 2.3 cm = volume: 6.1 mL. Normal appearance/no adnexal mass. Pulsed Doppler evaluation of both ovaries demonstrates normal low-resistance arterial and venous waveforms. Other findings No abnormal free fluid. IMPRESSION: Negative pelvic ultrasound.  No evidence for torsion. Electronically Signed   By: Jasmine Pang M.D.   On: 08/19/2019 21:15    Procedures Procedures (including critical care time)  Medications Ordered in ED Medications  sodium chloride 0.9 % bolus 1,000 mL (1,000 mLs Intravenous New Bag/Given 08/19/19 2054)    ED Course  I have reviewed the triage vital signs and the nursing notes.  Pertinent labs & imaging results that were available during my care of the patient were reviewed by me and considered in my medical decision making (see chart for details).    MDM Rules/Calculators/A&P                      Kamaryn Grimley is a 34 y.o. female who presented with left pelvic pain and vaginal bleeding.  Consider ruptured cyst versus fibroids versus ectopic pregnancy.  Will check pregnancy test, UA, pelvic ultrasound.  11:22 PM Labs showed hemoglobin of 10.  Ultrasound unremarkable and did not show any fibroids or ruptured cyst.  Patient is not pregnant.  Wet prep showed some clue cells but patient has  no discharge so we will hold off treatment.  Will give birth control to help regulate her cycle.  We will have her follow-up with GYN.  Final Clinical Impression(s) / ED Diagnoses Final diagnoses:  Left adnexal tenderness    Rx / DC Orders ED Discharge Orders    None       Charlynne Pander, MD 08/19/19 2324

## 2019-08-19 NOTE — ED Triage Notes (Signed)
Patient reports abdominal pain and spotting starting a week ago. Reports there have been some big clots. Reports fatigue and bloating.

## 2019-08-19 NOTE — Discharge Instructions (Addendum)
Follow up with the ER to rule out ovarian torsion, cyst, ectopic pregnancy

## 2019-08-19 NOTE — ED Triage Notes (Signed)
Patient reports abdominal pain and spotting starting a week ago. Patient endorses fatigue and passing large clots. Patient in no active distress at this time.

## 2019-08-19 NOTE — ED Notes (Signed)
Pt provided cup for UA 

## 2019-08-20 LAB — GC/CHLAMYDIA PROBE AMP (~~LOC~~) NOT AT ARMC
Chlamydia: NEGATIVE
Comment: NEGATIVE
Comment: NORMAL
Neisseria Gonorrhea: NEGATIVE

## 2019-08-21 NOTE — ED Provider Notes (Signed)
[facility]    CSN: [id number] Arrival date & time: 08/19/19  1206      History   Chief Complaint Chief Complaint  Patient presents with  . Abdominal Pain    HPI [name] Hendrix is a 34 y.o. female.   Patient presents with abnormal vaginal bleeding.  Reports that she has been bleeding for the last 2 weeks.  Reports that the first week was normal cycle.  Reports that this week she has been having clots, increased amounts of bleeding, has been going through more than 1 tampon and pad an hour.  Reports that she has been having increased left lower abdominal pain with the second week of bleeding.  Denies this ever happening to her before.  Denies ever having ovarian cyst, fibroids, other obvious causes of pain and bleeding.  Denies headache, cough, shortness of breath, nausea, vomiting, diarrhea, rash, fever, other symptoms.  Patient has no significant medical history.  ROS per HPI  The history is provided by the patient.  Abdominal Pain   Past Medical History:  Diagnosis Date  . Hypertension   . Kidney stones   . Placenta previa     Patient Active Problem List   Diagnosis Date Noted  . S/P cesarean section 06/18/2013  . Placenta previa antepartum in third trimester 05/03/2013  . Placenta previa 04/19/2013  . Vaginal bleeding in pregnancy 03/25/2013    Past Surgical History:  Procedure Laterality Date  . CESAREAN SECTION    . CESAREAN SECTION N/A 06/18/2013   Procedure: CESAREAN SECTION;  Surgeon: Frederico Hamman, MD;  Location: [facility];  Service: Obstetrics;  Laterality: N/A;  . CESAREAN SECTION WITH BILATERAL TUBAL LIGATION Bilateral 06/18/2013   Procedure: REPEAT CESAREAN SECTION WITH BILATERAL TUBAL LIGATION;  Surgeon: Frederico Hamman, MD;  Location: [facility];  Service: Obstetrics;  Laterality: Bilateral;    OB History    Gravida  4   Para  3   Term  2   Preterm  1   AB  1   Living  3     SAB  1   TAB      Ectopic      Multiple        Live Births  3            Home Medications    Prior to Admission medications   Medication Sig Start Date End Date Taking? Authorizing Provider  albuterol (VENTOLIN HFA) 108 (90 Base) MCG/ACT inhaler Inhale 2 puffs into the lungs every 4 (four) hours as needed for wheezing or shortness of breath. 06/09/19   Lawyer, Harrell Gave, PA-C  Norgestimate-Ethinyl Estradiol Triphasic (ORTHO TRI-CYCLEN LO) 0.18/0.215/0.25 MG-25 MCG tab Take 1 tablet by mouth daily. 08/19/19   Drenda Freeze, MD  ondansetron (ZOFRAN-ODT) 4 MG disintegrating tablet Take 1 tablet (4 mg total) by mouth every 8 (eight) hours as needed for nausea or vomiting. Patient not taking: Reported on 06/09/2019 05/16/19   [name], MD  promethazine (PHENERGAN) 25 MG tablet Take 1 tablet (25 mg total) by mouth every 6 (six) hours as needed for nausea or vomiting. Patient not taking: Reported on 08/19/2019 06/09/19   Dalia Heading, PA-C  traMADol (ULTRAM) 50 MG tablet Take 1 tablet (50 mg total) by mouth every 6 (six) hours as needed for severe pain. Patient not taking: Reported on 08/19/2019 06/09/19   Dalia Heading, PA-C    Family History Family History  Problem Relation Age of Onset  . Diabetes Maternal Grandfather   .  Hypertension Maternal Grandfather     Social History Social History   Tobacco Use  . Smoking status: Former Smoker    Packs/day: 0.25    Years: 14.00    Pack years: 3.50    Types: Cigarettes  . Smokeless tobacco: Former Engineer, water Use Topics  . Alcohol use: No  . Drug use: No     Allergies   Hydromet [hydrocodone-homatropine]   Review of Systems Review of Systems  Gastrointestinal: Positive for abdominal pain.     Physical Exam Triage Vital Signs ED Triage Vitals  Enc Vitals Group     BP 08/19/19 1226 124/82     Pulse Rate 08/19/19 1226 93     Resp 08/19/19 1226 17     Temp 08/19/19 1226 98.5 F (36.9 C)     Temp Source 08/19/19 1226 Oral     SpO2 08/19/19  1226 100 %     Weight --      Height --      Head Circumference --      Peak Flow --      Pain Score 08/19/19 1224 8     Pain Loc --      Pain Edu? --      Excl. in GC? --    No data found.  Updated Vital Signs BP 124/82 (BP Location: Right Arm)   Pulse 93   Temp 98.5 F (36.9 C) (Oral)   Resp 17   LMP 08/05/2019 (Exact Date)   SpO2 100%       Physical Exam Vitals and nursing note reviewed.  Constitutional:      General: She is not in acute distress.    Appearance: She is well-developed and normal weight. She is not ill-appearing.  HENT:     Head: Normocephalic and atraumatic.  Eyes:     Conjunctiva/sclera: Conjunctivae normal.  Cardiovascular:     Rate and Rhythm: Normal rate and regular rhythm.     Heart sounds: Normal heart sounds. No murmur.  Pulmonary:     Effort: Pulmonary effort is normal. No respiratory distress.     Breath sounds: Normal breath sounds. No stridor. No wheezing, rhonchi or rales.  Chest:     Chest wall: No tenderness.  Abdominal:     General: Abdomen is flat. Bowel sounds are normal. There is no distension or abdominal bruit.     Palpations: Abdomen is soft.     Tenderness: There is abdominal tenderness in the left lower quadrant. There is guarding. There is no right CVA tenderness, left CVA tenderness or rebound. Negative signs include Murphy's sign, Rovsing's sign, McBurney's sign, psoas sign and obturator sign.     Hernia: No hernia is present.  Musculoskeletal:     Cervical back: Neck supple.  Skin:    General: Skin is warm and dry.     Capillary Refill: Capillary refill takes less than 2 seconds.  Neurological:     General: No focal deficit present.     Mental Status: She is alert and oriented to person, place, and time.  Psychiatric:        Mood and Affect: Mood normal.        Behavior: Behavior normal.      UC Treatments / Results  Labs (all labs ordered are listed, but only abnormal results are displayed) Labs Reviewed   POCT URINALYSIS DIP (DEVICE) - Abnormal; Notable for the following components:      Result Value   Ketones, ur TRACE (*)  Hgb urine dipstick LARGE (*)    All other components within normal limits  POC URINE PREG, ED    EKG   Radiology   Procedures Procedures (including critical care time)  Medications Ordered in UC Medications - No data to display  Initial Impression / Assessment and Plan / UC Course  I have reviewed the triage vital signs and the nursing notes.  Pertinent labs & imaging results that were available during my care of the patient were reviewed by me and considered in my medical decision making (see chart for details).     Abnormal Vaginal Bleeding Pelvic Pain, LLQ pain: Presents with 2-week history of abnormal vaginal bleeding.  Has pictures in the office of quarter size clots that she has been passing over the last week.  Urinary pregnancy test in office is negative.  UA not concerning for infection today.  Lungs CTA bilaterally, heart sounds normal S1-S2.  Abdominal tenderness noted over LLQ with guarding, pelvic tenderness.  Pelvic exam deferred per patient request.  Discussed with patient the possibilities of a cause of her pain including: Ovarian cyst, ectopic pregnancy, uterine fibroids, perimenopause, ovarian torsion.  Discussed bleeding risk with ectopic pregnancy and fibroids.  Discussed that if it was an ectopic pregnancy, surgery will be a likely outcome.  Discussed risk of ovary loss with ovarian torsion.  Discussed limitations of treatment since we cannot identify the source of her bleeding.  Did discuss Megace prescription versus ultrasound to better visualize because of her pain and bleeding.  Patient decided that she would like to have a CT to discern the cause of her pain.  Patient states that she will go to [facility] med center to have this done.  She will follow-up there. Final Clinical Impressions(s) / UC Diagnoses   Final diagnoses:  Pelvic  pain in female  Abnormal vaginal bleeding  LLQ pain     Discharge Instructions     Follow up with the ER to rule out ovarian torsion, cyst, ectopic pregnancy    ED Prescriptions    None     PDMP not reviewed this encounter.   Moshe Cipro, NP 08/21/19 1229

## 2019-12-24 ENCOUNTER — Ambulatory Visit (INDEPENDENT_AMBULATORY_CARE_PROVIDER_SITE_OTHER): Payer: Medicaid Other

## 2019-12-24 ENCOUNTER — Other Ambulatory Visit: Payer: Self-pay

## 2019-12-24 ENCOUNTER — Encounter: Payer: Self-pay | Admitting: Emergency Medicine

## 2019-12-24 ENCOUNTER — Ambulatory Visit
Admission: EM | Admit: 2019-12-24 | Discharge: 2019-12-24 | Disposition: A | Discharge status: Home / Self Care | Payer: Medicaid Other | Attending: Family Medicine | Admitting: Family Medicine

## 2019-12-24 DIAGNOSIS — W208XXA Other cause of strike by thrown, projected or falling object, initial encounter: Secondary | ICD-10-CM

## 2019-12-24 DIAGNOSIS — S9031XA Contusion of right foot, initial encounter: Secondary | ICD-10-CM

## 2019-12-24 DIAGNOSIS — M25561 Pain in right knee: Secondary | ICD-10-CM | POA: Diagnosis not present

## 2019-12-24 DIAGNOSIS — M79671 Pain in right foot: Secondary | ICD-10-CM | POA: Diagnosis not present

## 2019-12-24 NOTE — ED Provider Notes (Signed)
[facility]    CSN: [id number] Arrival date & time: 12/24/19  1042      History   Chief Complaint Chief Complaint  Patient presents with  . Foot Pain    HPI [name] Hendrix is a 34 y.o. female.   Patient complains of right knee and foot pain.  Dresser fell on her foot and knee several days ago.  She walks with a noticeable limp and pain is primarily in her toe and foot but also her knee.  I suspect knee pain is coming from change in her gait but she is requesting x-ray of the knee.  HPI  Past Medical History:  Diagnosis Date  . Hypertension   . Kidney stones   . Placenta previa     Patient Active Problem List   Diagnosis Date Noted  . S/P cesarean section 06/18/2013  . Placenta previa antepartum in third trimester 05/03/2013  . Placenta previa 04/19/2013  . Vaginal bleeding in pregnancy 03/25/2013    Past Surgical History:  Procedure Laterality Date  . CESAREAN SECTION    . CESAREAN SECTION N/A 06/18/2013   Procedure: CESAREAN SECTION;  Surgeon: [name], MD;  Location: [facility];  Service: Obstetrics;  Laterality: N/A;  . CESAREAN SECTION WITH BILATERAL TUBAL LIGATION Bilateral 06/18/2013   Procedure: REPEAT CESAREAN SECTION WITH BILATERAL TUBAL LIGATION;  Surgeon: [name], MD;  Location: [facility];  Service: Obstetrics;  Laterality: Bilateral;    OB History    Gravida  4   Para  3   Term  2   Preterm  1   AB  1   Living  3     SAB  1   TAB      Ectopic      Multiple      Live Births  3            Home Medications    Prior to Admission medications   Medication Sig Start Date End Date Taking? Authorizing Provider  albuterol (VENTOLIN HFA) 108 (90 Base) MCG/ACT inhaler Inhale 2 puffs into the lungs every 4 (four) hours as needed for wheezing or shortness of breath. 06/09/19   Lawyer, Cristal Deer, PA-C  Norgestimate-Ethinyl Estradiol Triphasic (ORTHO TRI-CYCLEN LO) 0.18/0.215/0.25 MG-25 MCG tab Take 1 tablet by  mouth daily. 08/19/19   Charlynne Pander, MD  ondansetron (ZOFRAN-ODT) 4 MG disintegrating tablet Take 1 tablet (4 mg total) by mouth every 8 (eight) hours as needed for nausea or vomiting. Patient not taking: Reported on 06/09/2019 05/16/19   Mardella Layman, MD  promethazine (PHENERGAN) 25 MG tablet Take 1 tablet (25 mg total) by mouth every 6 (six) hours as needed for nausea or vomiting. Patient not taking: Reported on 08/19/2019 06/09/19   Charlestine Night, PA-C  traMADol (ULTRAM) 50 MG tablet Take 1 tablet (50 mg total) by mouth every 6 (six) hours as needed for severe pain. Patient not taking: Reported on 08/19/2019 06/09/19   Charlestine Night, PA-C    Family History Family History  Problem Relation Age of Onset  . Diabetes Maternal Grandfather   . Hypertension Maternal Grandfather     Social History Social History   Tobacco Use  . Smoking status: Former Smoker    Packs/day: 0.25    Years: 14.00    Pack years: 3.50    Types: Cigarettes  . Smokeless tobacco: Former Engineer, water Use Topics  . Alcohol use: No  . Drug use: No     Allergies  Hydromet [hydrocodone-homatropine]   Review of Systems Review of Systems  Musculoskeletal: Positive for arthralgias and gait problem.  All other systems reviewed and are negative.    Physical Exam Triage Vital Signs ED Triage Vitals [12/24/19 1104]  Enc Vitals Group     BP (!) 146/89     Pulse Rate 76     Resp 18     Temp 98.6 F (37 C)     Temp Source Oral     SpO2 98 %     Weight      Height      Head Circumference      Peak Flow      Pain Score 10     Pain Loc      Pain Edu?      Excl. in GC?    No data found.  Updated Vital Signs BP (!) 146/89 (BP Location: Left Arm)   Pulse 76   Temp 98.6 F (37 C) (Oral)   Resp 18   SpO2 98%   Visual Acuity Right Eye Distance:   Left Eye Distance:   Bilateral Distance:    Right Eye Near:   Left Eye Near:    Bilateral Near:     Physical Exam Nursing note  reviewed.  Constitutional:      Appearance: Normal appearance.  Musculoskeletal:     Comments: There is swelling of the second right toe and it is tender to touch. Right knee there is tenderness on the medial aspect and some localized swelling suggestive of possible traumatic bursitis  Neurological:     Mental Status: She is alert.      UC Treatments / Results  Labs (all labs ordered are listed, but only abnormal results are displayed) Labs Reviewed - No data to display  EKG   Radiology No results found.  Procedures Procedures (including critical care time)  Medications Ordered in UC Medications - No data to display  Initial Impression / Assessment and Plan / UC Course  I have reviewed the triage vital signs and the nursing notes.  Pertinent labs & imaging results that were available during my care of the patient were reviewed by me and considered in my medical decision making (see chart for details).     Contusion foot and knee, right side.  Buddy taped toe with Coban and cottonball restrict walking until symptoms are improved.  Given work note Final Clinical Impressions(s) / UC Diagnoses   Final diagnoses:  None   Discharge Instructions   None    ED Prescriptions    None     PDMP not reviewed this encounter.   Frederica Kuster, MD 12/24/19 1209

## 2019-12-24 NOTE — ED Triage Notes (Signed)
Pt here for right second digit pain with laceration noted after dresser fell onto foot 2 days ago per pt; pt sts increasing pain

## 2020-03-13 DIAGNOSIS — Z20822 Contact with and (suspected) exposure to covid-19: Secondary | ICD-10-CM | POA: Diagnosis not present

## 2020-06-22 IMAGING — CT CT ABD-PELV W/ CM
2 of 4 series · 12 of 46 positions shown, 14 images · IV contrast (APPLIED)
Comparison: September 02, 2012
COMPARISON: September 02, 2012

Addendum:
CLINICAL DATA: Abdominal pain. Right lower quadrant pain.
Questionable hematuria.

EXAM:
CT ABDOMEN AND PELVIS WITH CONTRAST
TECHNIQUE: Multidetector CT imaging of the abdomen and pelvis was performed
using the standard protocol following bolus administration of
intravenous contrast.
CONTRAST:  100mL OMNIPAQUE IOHEXOL 300 MG/ML  SOLN

[Series 3: abdomen 5.0 · axial · 0.66mm/px · z∈[+625,+980]mm · 9 of 81 slices shown, 11 images]
[im 5/81  soft-tissue]
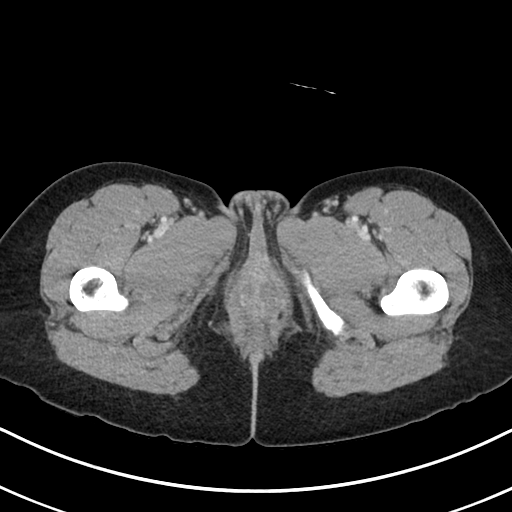
[im 5/81  bone]
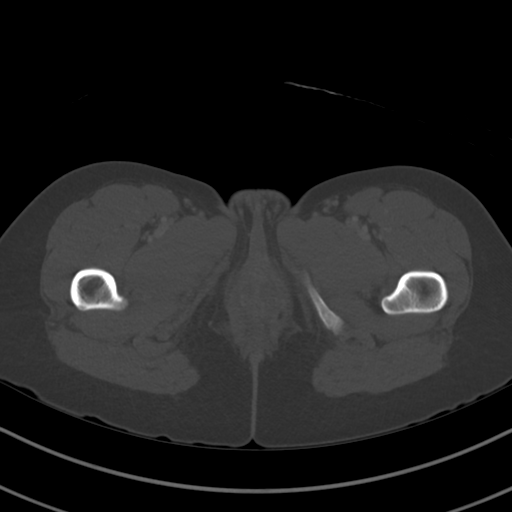
[im 14/81  soft-tissue]
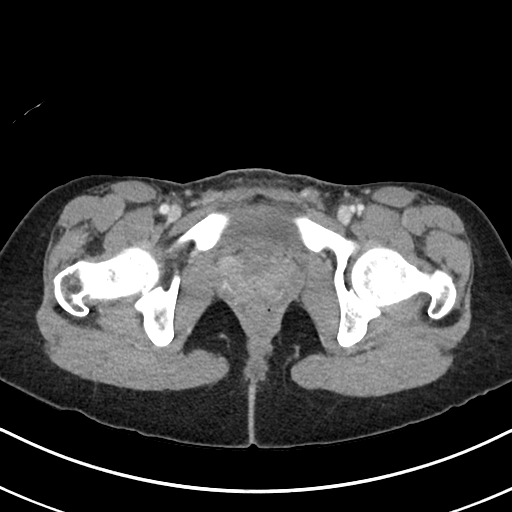
[im 23/81  soft-tissue]
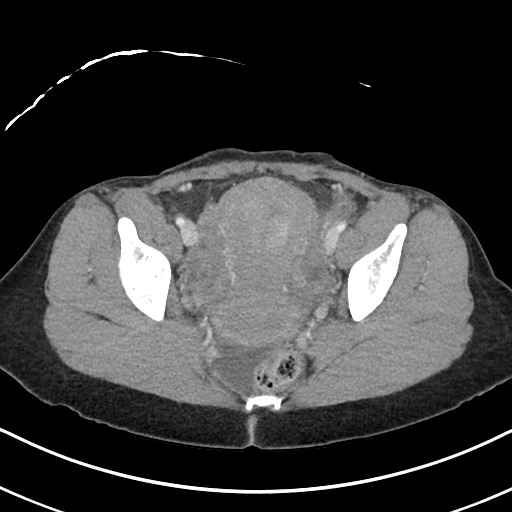
[im 32/81  soft-tissue]
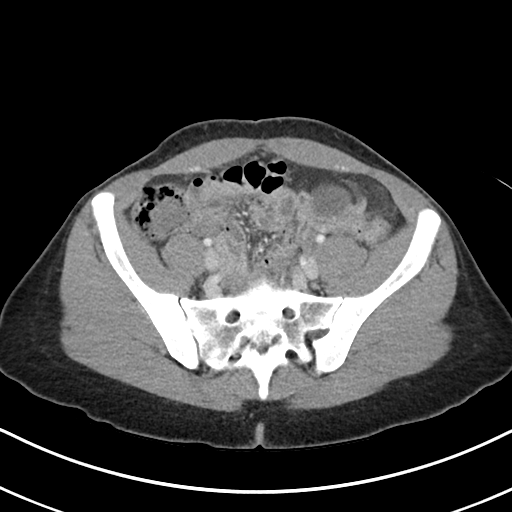
[im 41/81  soft-tissue]
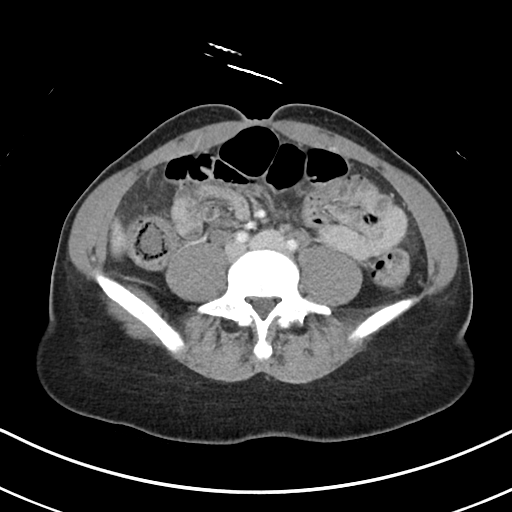
[im 49/81  soft-tissue]
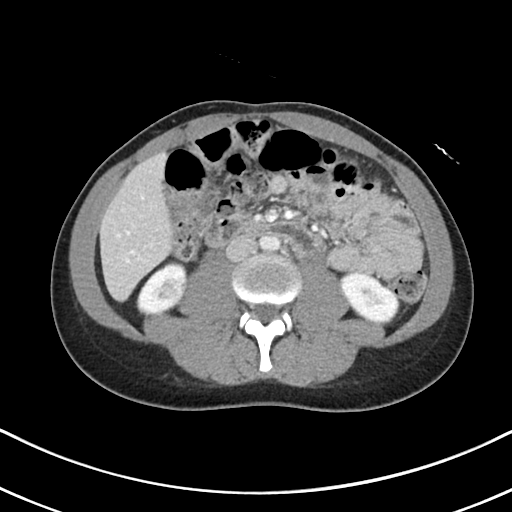
[im 58/81  soft-tissue]
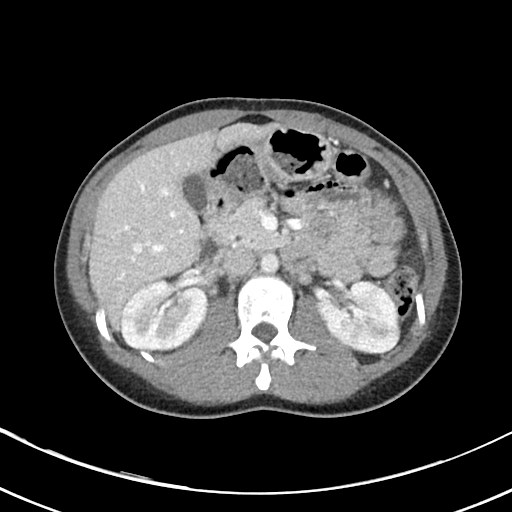
[im 67/81  soft-tissue]
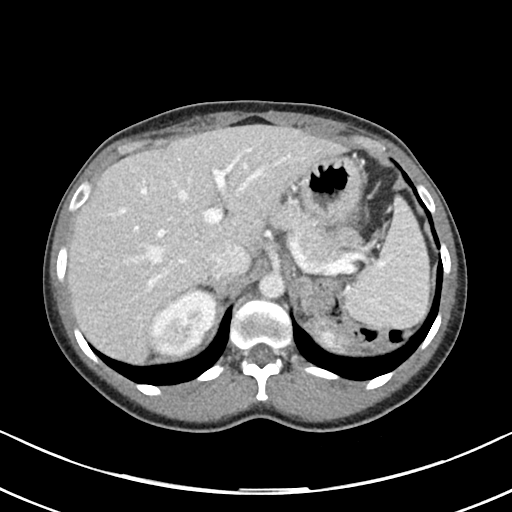
[im 76/81  soft-tissue]
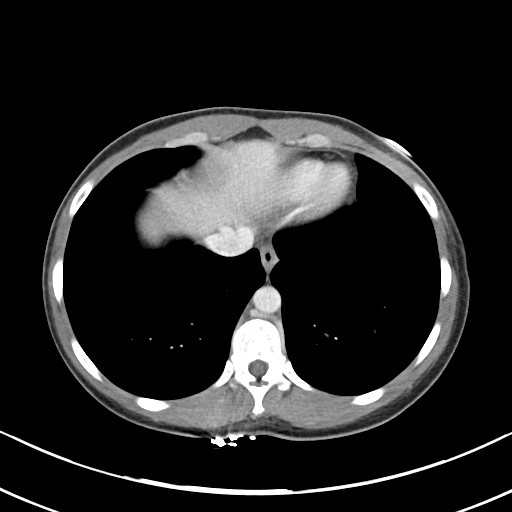
[im 76/81  bone]
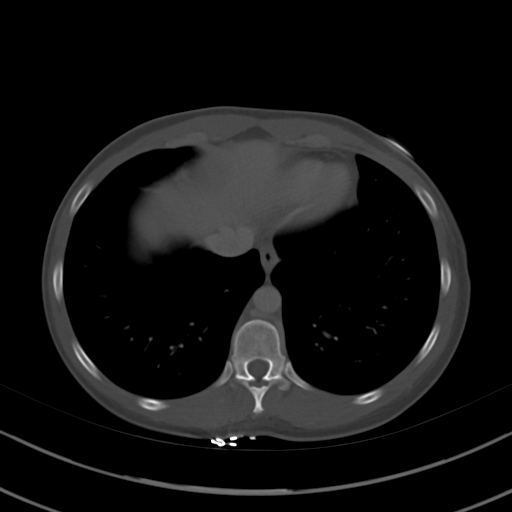

[Series 6: abdomen 3.0 mpr cor · coronal · 0.63mm/px · 3 of 79 slices shown]
[im 27/79  soft-tissue]
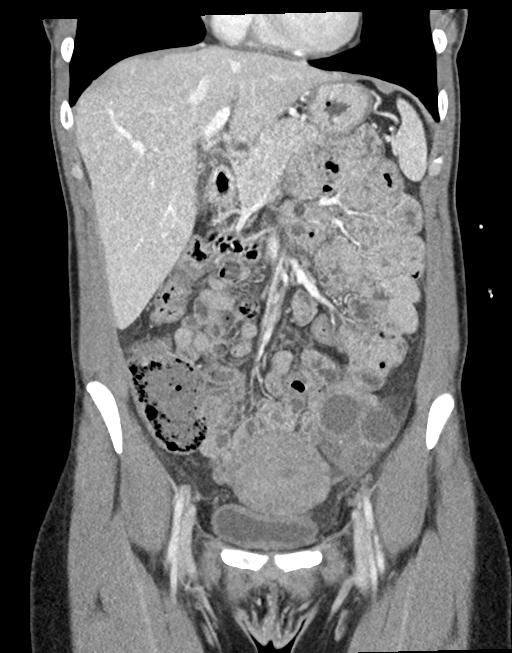
[im 35/79  soft-tissue]
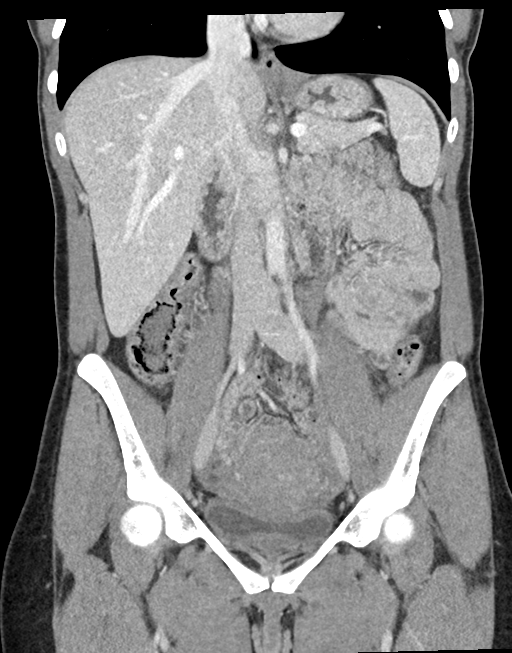
[im 44/79  soft-tissue]
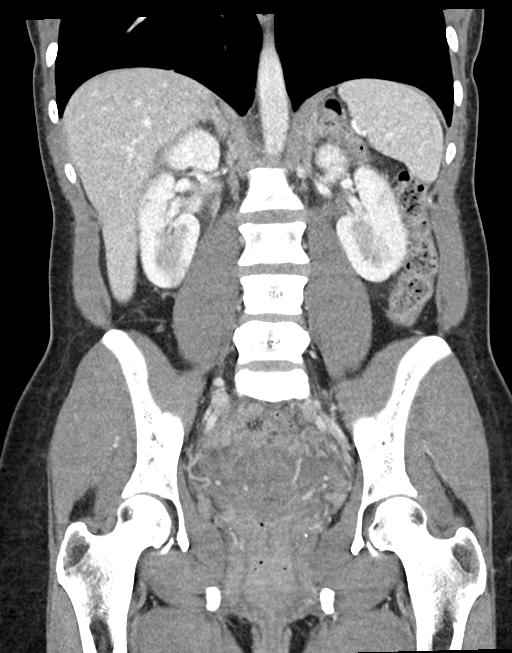

[12 of 46 positions shown; findings below may reference images not displayed]

FINDINGS: Lower chest: There is a stable subpleural nodule along the
peripheral left lower lobe (axial series 5, image 19).The heart size
is normal.

Hepatobiliary: The liver is normal. Normal gallbladder.There is no
biliary ductal dilation.

Pancreas: Normal contours without ductal dilatation. No
peripancreatic fluid collection.

Spleen: No splenic laceration or hematoma.

Adrenals/Urinary Tract:

--Adrenal glands: No adrenal hemorrhage.

--Right kidney/ureter: There is a striated appearance of the right
renal cortex without evidence for hydronephrosis. The right ureter
appears chronically dilated as seen on prior study from 1479. There
is indeterminate 1.4 cm hypoattenuating nodule in the upper pole the
right kidney measuring approximately 39 Hounsfield units (axial
series 3, image 19).

--Left kidney/ureter: There is a striated appearance of the left
renal cortex without evidence for hydronephrosis. There is a
nonobstructing stone in the lower pole the left kidney. The left
ureter proximally appears to be dilated, similar to prior study.

--Urinary bladder: There is mild wall thickening of the urinary
bladder with adjacent fat stranding.

Stomach/Bowel:

--Stomach/Duodenum: No hiatal hernia or other gastric abnormality.
Normal duodenal course and caliber.

--Small bowel: No dilatation or inflammation.

--Colon: No focal abnormality.

--Appendix: The appendix is unremarkable (axial series 3, image 46).

Vascular/Lymphatic: Normal course and caliber of the major abdominal
vessels.

--No retroperitoneal lymphadenopathy.

--No mesenteric lymphadenopathy.

--No pelvic or inguinal lymphadenopathy.

Reproductive: There are 2 cystic structures involving the left ovary
measuring approximately 2.7 and 2.8 cm. Given their size, no further
follow-up is required.

Other: There is a small volume of pelvic free fluid which is likely
physiologic. No free air. the abdominal wall is normal.

Musculoskeletal. No acute displaced fractures.
IMPRESSION: 1. Striated appearance of the bilateral renal cortex is suggestive
of pyelonephritis in the proper clinical setting.
2. There is mild wall thickening of the urinary bladder with
adjacent fat stranding, suggestive of cystitis.
3. There is a small 2-3 mm nonobstructing stone in the lower pole
the left kidney.
4. Normal appendix.
5. Chronically dilated right ureter of unknown clinical
significance. There is no evidence for hydronephrosis. There is no
evidence for an obstructing stone.
6. Indeterminate 1.4 cm thyroid nodule in the upper pole of the
right kidney. This is favored to represent a hemorrhagic or
proteinaceous cyst but is incompletely characterized on this exam.
Follow-up with a nonemergent outpatient renal ultrasound is
recommended for further evaluation of this finding.

ADDENDUM:
In the impression of the report, item #6 should state indeterminate
1.4 cm renal nodule, not thyroid nodule.

*** End of Addendum ***
FINDINGS: Lower chest: There is a stable subpleural nodule along the
peripheral left lower lobe (axial series 5, image 19).The heart size
is normal.

Hepatobiliary: The liver is normal. Normal gallbladder.There is no
biliary ductal dilation.

Pancreas: Normal contours without ductal dilatation. No
peripancreatic fluid collection.

Spleen: No splenic laceration or hematoma.

Adrenals/Urinary Tract:

--Adrenal glands: No adrenal hemorrhage.

--Right kidney/ureter: There is a striated appearance of the right
renal cortex without evidence for hydronephrosis. The right ureter
appears chronically dilated as seen on prior study from 1479. There
is indeterminate 1.4 cm hypoattenuating nodule in the upper pole the
right kidney measuring approximately 39 Hounsfield units (axial
series 3, image 19).

--Left kidney/ureter: There is a striated appearance of the left
renal cortex without evidence for hydronephrosis. There is a
nonobstructing stone in the lower pole the left kidney. The left
ureter proximally appears to be dilated, similar to prior study.

--Urinary bladder: There is mild wall thickening of the urinary
bladder with adjacent fat stranding.

Stomach/Bowel:

--Stomach/Duodenum: No hiatal hernia or other gastric abnormality.
Normal duodenal course and caliber.

--Small bowel: No dilatation or inflammation.

--Colon: No focal abnormality.

--Appendix: The appendix is unremarkable (axial series 3, image 46).

Vascular/Lymphatic: Normal course and caliber of the major abdominal
vessels.

--No retroperitoneal lymphadenopathy.

--No mesenteric lymphadenopathy.

--No pelvic or inguinal lymphadenopathy.

Reproductive: There are 2 cystic structures involving the left ovary
measuring approximately 2.7 and 2.8 cm. Given their size, no further
follow-up is required.

Other: There is a small volume of pelvic free fluid which is likely
physiologic. No free air. the abdominal wall is normal.

Musculoskeletal. No acute displaced fractures.
IMPRESSION: 1. Striated appearance of the bilateral renal cortex is suggestive
of pyelonephritis in the proper clinical setting.
2. There is mild wall thickening of the urinary bladder with
adjacent fat stranding, suggestive of cystitis.
3. There is a small 2-3 mm nonobstructing stone in the lower pole
the left kidney.
4. Normal appendix.
5. Chronically dilated right ureter of unknown clinical
significance. There is no evidence for hydronephrosis. There is no
evidence for an obstructing stone.
6. Indeterminate 1.4 cm thyroid nodule in the upper pole of the
right kidney. This is favored to represent a hemorrhagic or
proteinaceous cyst but is incompletely characterized on this exam.
Follow-up with a nonemergent outpatient renal ultrasound is
recommended for further evaluation of this finding.

## 2020-06-22 IMAGING — US US ABDOMEN COMPLETE
1 series · 13 of 25 positions shown · non-contrast
Comparison: CT from same day.
COMPARISON: CT from same day.

Addendum:
CLINICAL DATA: Right lower quadrant abdominal pain.

EXAM:
ABDOMEN ULTRASOUND COMPLETE

[Series 1: us abdomen complete · 13 of 102 slices shown]
[im 1/102]
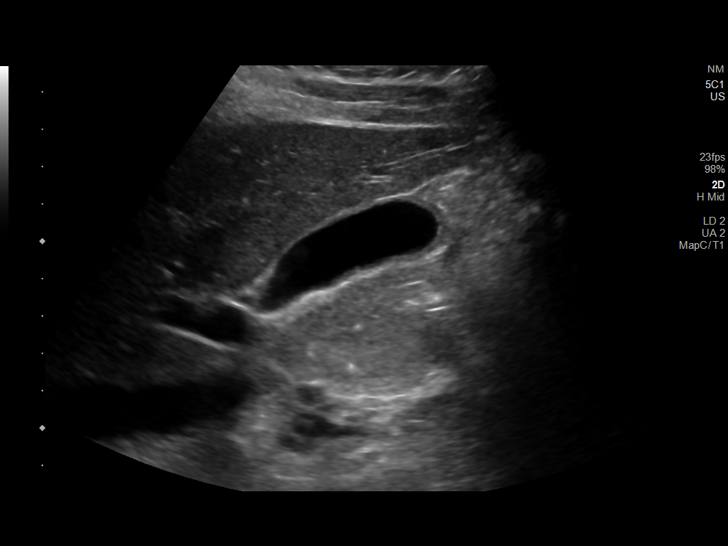
[im 9/102]
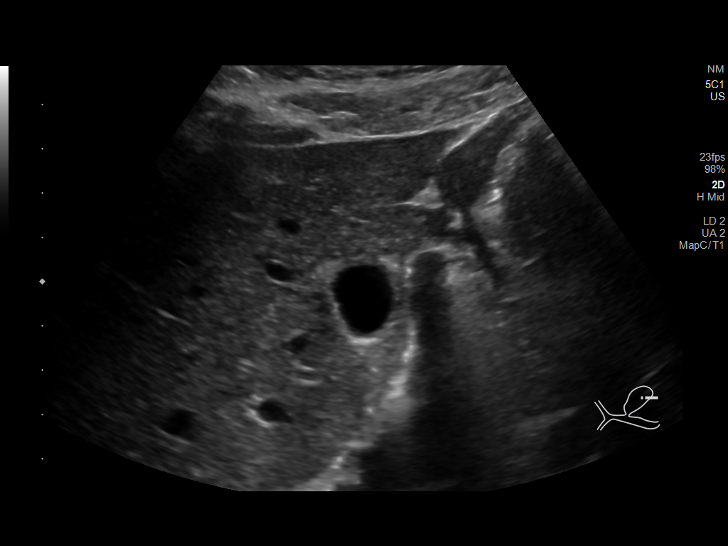
[im 17/102]
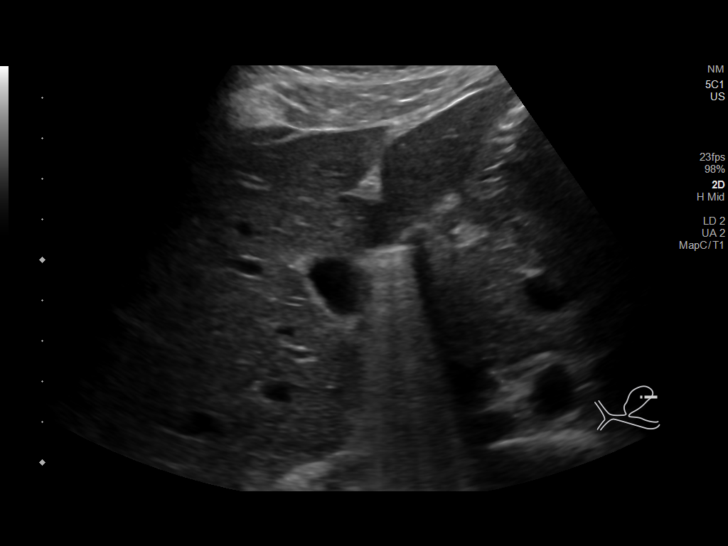
[im 26/102]
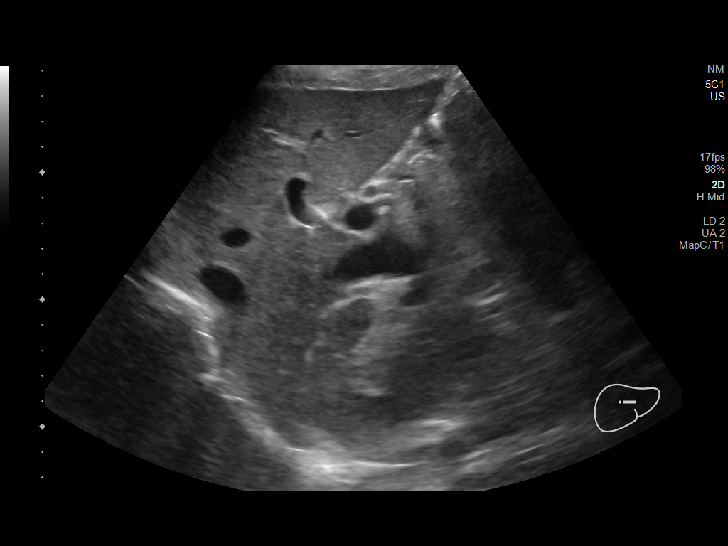
[im 34/102]
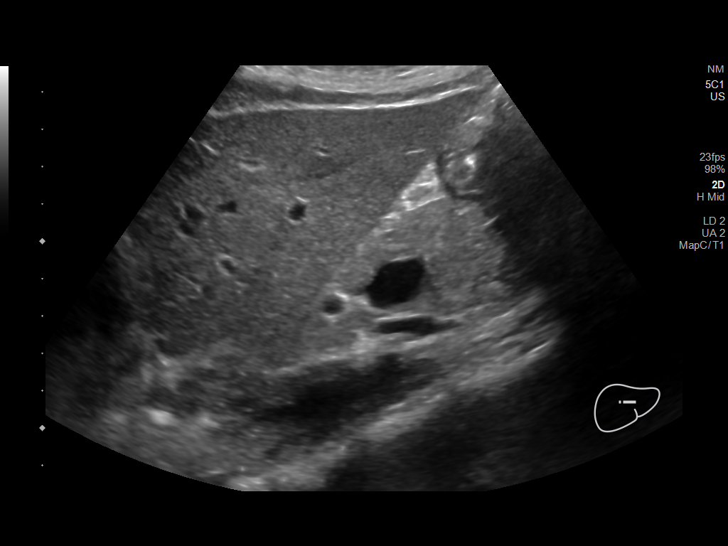
[im 43/102]
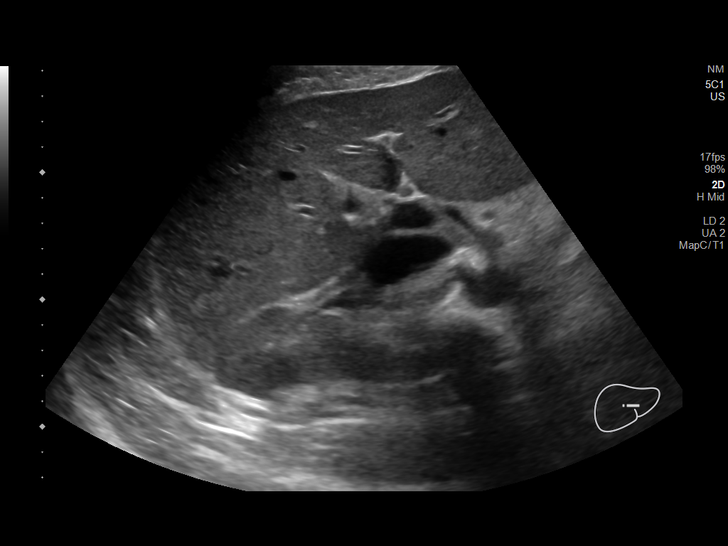
[im 51/102]
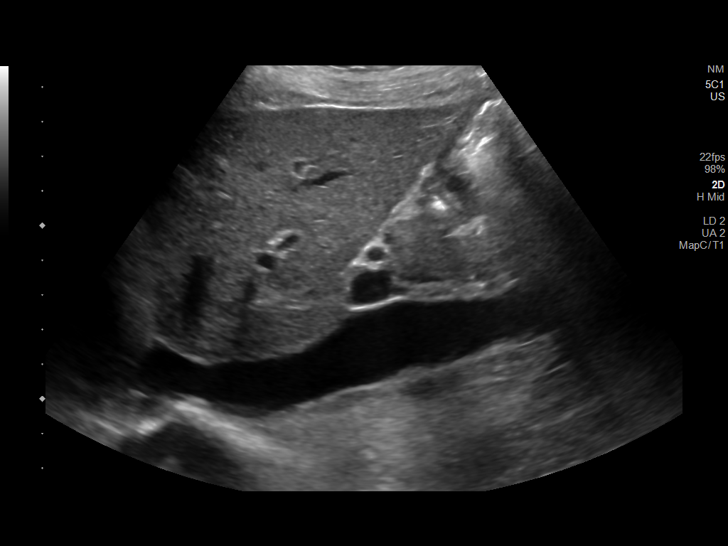
[im 59/102]
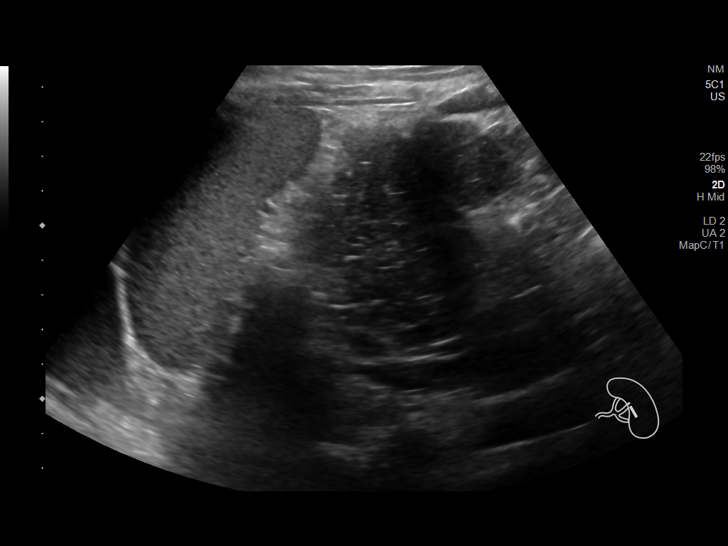
[im 68/102]
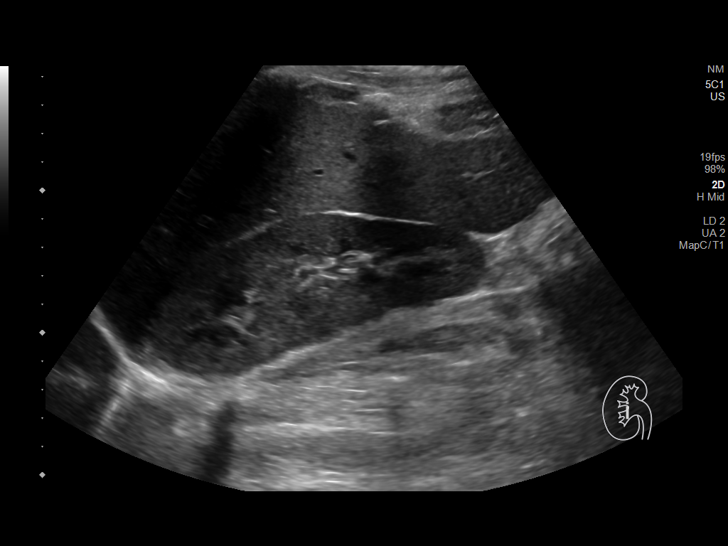
[im 76/102]
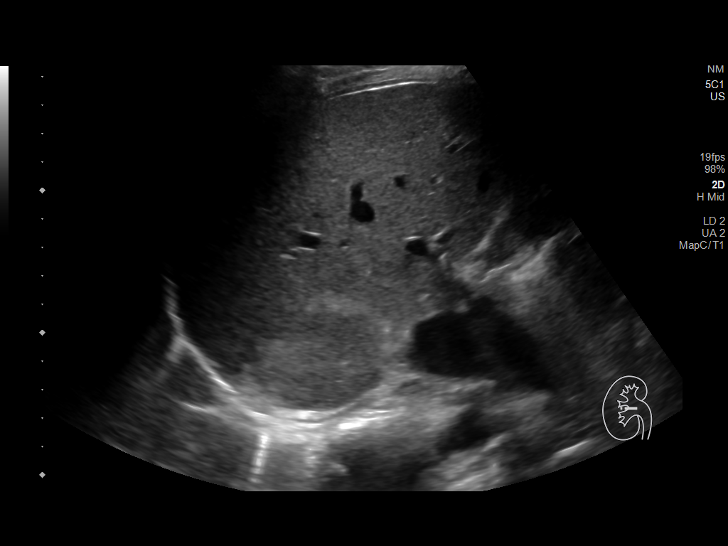
[im 85/102]
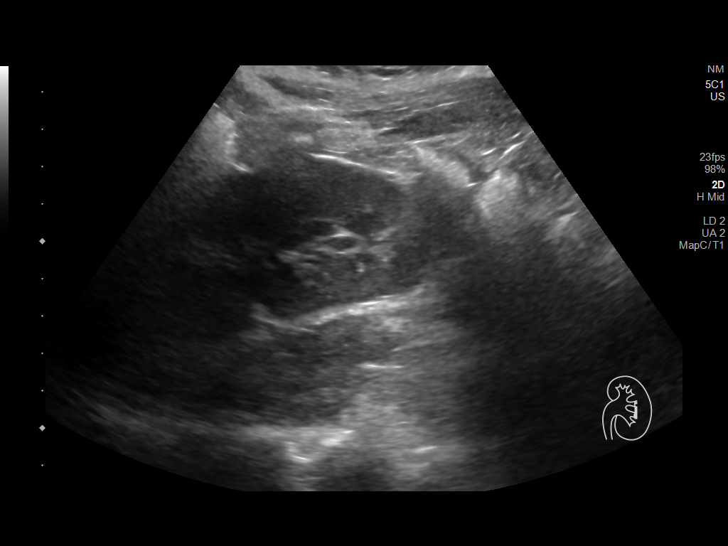
[im 93/102]
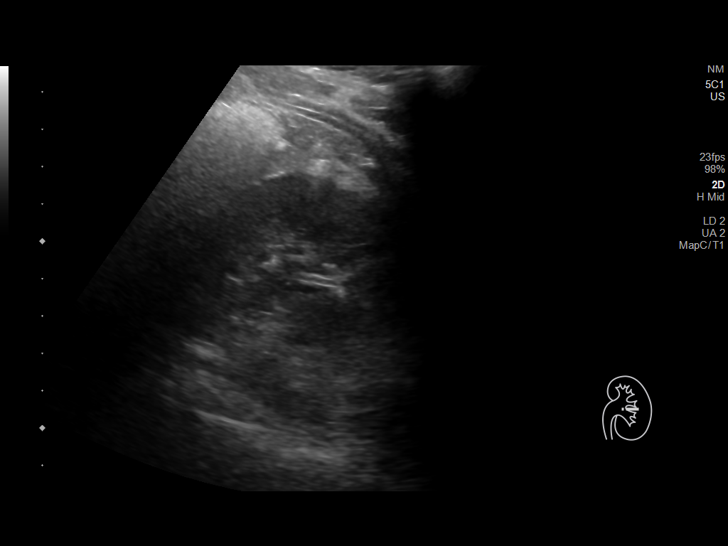
[im 102/102]
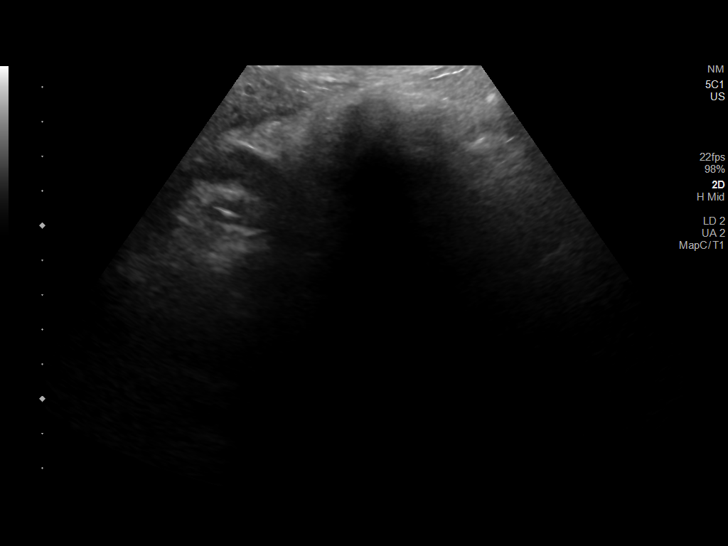

[13 of 25 positions shown; findings below may reference images not displayed]

FINDINGS: Gallbladder: No gallstones or wall thickening visualized. No
sonographic Murphy sign noted by sonographer.

Common bile duct: Diameter: 3 mm

Liver: No focal lesion identified. Within normal limits in
parenchymal echogenicity. Portal vein is patent on color Doppler
imaging with normal direction of blood flow towards the liver.

IVC: No abnormality visualized.

Pancreas: Visualized portion unremarkable.

Spleen: Size and appearance within normal limits.

Right Kidney: Length: 12.3 x 4.7 x 5.3 cm. There is a heterogeneous
appearance of the right renal cortex without evidence for
hydronephrosis. The dilated ureter seen on recent CT is not well
appreciated on this exam.

Left Kidney: Length: 8.1 cm. The left kidney is poorly evaluated
secondary to overlying bowel gas. There is no frank hydronephrosis.

Abdominal aorta: No aneurysm visualized.

Other findings: None.
IMPRESSION: 1. Heterogeneous appearance of the right kidney as seen on recent
CT, consistent with right-sided pyelonephritis in the appropriate
clinical setting.
2. No other acute abnormality. No evidence for acute cholecystitis.
No hydronephrosis.

ADDENDUM:
The indeterminate 1.4 cm structure arising from the right kidney
seen on recent CT appears to correspond to a simple cyst. No further
follow-up is required.

*** End of Addendum ***
FINDINGS: Gallbladder: No gallstones or wall thickening visualized. No
sonographic Murphy sign noted by sonographer.

Common bile duct: Diameter: 3 mm

Liver: No focal lesion identified. Within normal limits in
parenchymal echogenicity. Portal vein is patent on color Doppler
imaging with normal direction of blood flow towards the liver.

IVC: No abnormality visualized.

Pancreas: Visualized portion unremarkable.

Spleen: Size and appearance within normal limits.

Right Kidney: Length: 12.3 x 4.7 x 5.3 cm. There is a heterogeneous
appearance of the right renal cortex without evidence for
hydronephrosis. The dilated ureter seen on recent CT is not well
appreciated on this exam.

Left Kidney: Length: 8.1 cm. The left kidney is poorly evaluated
secondary to overlying bowel gas. There is no frank hydronephrosis.

Abdominal aorta: No aneurysm visualized.

Other findings: None.
IMPRESSION: 1. Heterogeneous appearance of the right kidney as seen on recent
CT, consistent with right-sided pyelonephritis in the appropriate
clinical setting.
2. No other acute abnormality. No evidence for acute cholecystitis.
No hydronephrosis.

## 2020-06-24 ENCOUNTER — Other Ambulatory Visit: Payer: Self-pay

## 2020-06-24 ENCOUNTER — Inpatient Hospital Stay (HOSPITAL_COMMUNITY)
Admission: AD | Admit: 2020-06-24 | Discharge: 2020-06-24 | Disposition: A | Discharge status: Home / Self Care | Payer: Medicaid Other | Attending: Obstetrics & Gynecology | Admitting: Obstetrics & Gynecology

## 2020-06-24 DIAGNOSIS — Z3202 Encounter for pregnancy test, result negative: Secondary | ICD-10-CM | POA: Diagnosis present

## 2020-06-24 DIAGNOSIS — N946 Dysmenorrhea, unspecified: Secondary | ICD-10-CM

## 2020-06-24 DIAGNOSIS — N944 Primary dysmenorrhea: Secondary | ICD-10-CM | POA: Diagnosis not present

## 2020-06-24 LAB — POCT PREGNANCY, URINE: Preg Test, Ur: NEGATIVE

## 2020-06-24 NOTE — [facility] Note (Signed)
[name] Hendrix is a 35 y.o.  here in [facility] reporting: missed her period in January, no hx of irregular cycles. Started having vaginal pain and spotting yesterday. Had not done a pregnancy test at home  LMP: 04/07/20  Onset of complaint: yesterday  Pain score: 9/10  Vitals:   06/24/20 1815  BP: 119/67  Pulse: 92  Resp: 16  Temp: 98.2 F (36.8 C)  SpO2: 98%     Lab orders placed from triage: UPT

## 2020-06-24 NOTE — Progress Notes (Signed)
None     S [name] Hendrix is a 35 y.o. [phone] patient who presents to [facility] today with complaint of vaginal bleeding and request for pregnancy test.   O BP 119/67 (BP Location: Right Arm)   Pulse 92   Temp 98.2 F (36.8 C) (Oral)   Resp 16   Ht 5' (1.524 m)   Wt 57.7 kg   LMP 04/07/2020   SpO2 98% Comment: room air  BMI 24.84 kg/m  Physical Exam Vitals and nursing note reviewed. Exam conducted with a chaperone present.  Constitutional:      General: She is not in acute distress.    Appearance: Normal appearance. She is normal weight.  HENT:     Head: Normocephalic and atraumatic.     Nose: Nose normal.     Mouth/Throat:     Mouth: Mucous membranes are moist.     Pharynx: Oropharynx is clear.  Eyes:     Extraocular Movements: Extraocular movements intact.     Conjunctiva/sclera: Conjunctivae normal.  Cardiovascular:     Rate and Rhythm: Normal rate.     Pulses: Normal pulses.  Pulmonary:     Effort: Pulmonary effort is normal.  Musculoskeletal:        General: Normal range of motion.     Cervical back: Normal range of motion and neck supple.  Skin:    General: Skin is warm and dry.  Neurological:     General: No focal deficit present.     Mental Status: She is alert and oriented to person, place, and time. Mental status is at baseline.  Psychiatric:        Mood and Affect: Mood normal.        Behavior: Behavior normal.     A Medical screening exam complete, patient with dysmenorrhea, UPT negative.  P Discharge from MAU in stable condition Patient instructed to take ibuprofen or tylenol for dysmenorrhea.  Warning signs for worsening condition that would warrant emergency follow-up discussed Patient may return to [facility]U as needed   Alric Seton, MD 06/24/2020 6:38 PM

## 2020-06-24 NOTE — Discharge Instructions (Signed)
Dysmenorrhea Dysmenorrhea means cramps during your period (menstrual period) that cause pain in your lower belly (abdomen). The pain is caused by the tightening (contracting) of the muscles of the womb (uterus). The pain may be mild or very bad. Primary dysmenorrhea is cramps that last a couple of days when a woman starts having periods or soon after. As a woman gets older or has a baby, the cramps will usually lessen or disappear. Secondary dysmenorrhea begins later in life and is caused by other problems. What are the causes? This condition may be caused by problems with the:  Tissue that lines the womb. This tissue may grow: ? Outside of the womb. ? Into the walls of the womb.  Blood vessels in the area between your hip bones (pelvis).  Tissue in the lower part of the womb (cervix), including growths (polyps).  Muscles that hold up the womb.  Bladder.  Bowels. It can also be caused by cancer. Other causes include:  A very tipped womb.  The lower part of the womb having a small opening.  Tumors in the womb that are not cancer.  Pelvic inflammatory disease (PID).  Scars from surgeries you have had.  A cyst in the ovaries.  An IUD (intrauterine device). What increases the risk?  Being younger than age 30.  Having started puberty early.  Having irregular bleeding or heavy bleeding.  Never having given birth.  Having a family history of period cramps.  Smoking or using products with nicotine.  Having a high body weight or a low body weight. What are the signs or symptoms?  Cramps and pain in the lower belly or lower back.  A feeling of fullness in the lower belly.  Periods lasting for longer than 7 days.  Headaches.  Bloating.  Tiredness (fatigue).  Feeling like you may vomit (nauseous) or vomiting.  Watery poop (diarrhea) or loose poop (stool).  Sweating.  Dizziness. How is this treated? Treatment depends on the cause of the cramps. Treatment may  include medicines, such as:  Medicines for pain.  Medicines for bleeding.  Body chemical (hormone) replacement therapy. ? Shots (injections) to stop the menstrual period. ? Birth control pills. ? An IUD.  NSAIDs, such as ibuprofen. Other treatments may include:  Surgeries.  Procedures.  Nerve stimulation.  Doing exercises.  Yoga and alternative treatments. Work with your doctor to find what treatments are best for you. Follow these instructions at home: Helping pain and cramping  If told, put heat on your lower back or belly when you have pain or cramps. Do this as often as told by your doctor. Use the heat source that your doctor recommends, such as a moist heat pack or a heating pad. ? Place a towel between your skin and the heat. ? Leave the heat on for 20-30 minutes. ? Take off the heat if your skin turns bright red. This is very important. If you cannot feel pain, heat, or cold, you have a greater risk of getting burned.  Do not sleep with a heating pad.  Exercise. Walking, swimming, or biking can help take away cramps.  Massage your lower back or belly. This may help lessen pain.   General instructions  Take over-the-counter and prescription medicines only as told by your doctor.  Ask your doctor if you should avoid driving or using machines while you are taking your medicine.  Avoid alcohol and caffeine during and right before your period. These can make cramps worse.  Do not   smoke or use any products that contain nicotine or tobacco. If you need help quitting, ask your doctor.  Keep all follow-up visits. Contact a doctor if:  You have pain that gets worse.  You have pain that does not get better with medicine.  You have pain during sex.  You feel like you may vomit or you vomit during your period and medicine does not help. Get help right away if:  You faint. Summary  Dysmenorrhea means painful cramps during your period.  Put heat on your lower  back or belly when you have pain or cramps.  Do exercises like walking, swimming, or biking to help with cramps.  Contact a doctor if you have pain during sex. This information is not intended to replace advice given to you by your health care provider. Make sure you discuss any questions you have with your health care provider. Document Revised: 11/27/2019 Document Reviewed: 11/27/2019 Elsevier Patient Education  2021 [employer]  

## 2020-09-10 ENCOUNTER — Ambulatory Visit (HOSPITAL_COMMUNITY)
Admission: EM | Admit: 2020-09-10 | Discharge: 2020-09-10 | Disposition: A | Discharge status: Home / Self Care | Payer: Medicaid Other | Attending: Internal Medicine | Admitting: Internal Medicine

## 2020-09-10 ENCOUNTER — Other Ambulatory Visit: Payer: Self-pay

## 2020-09-10 ENCOUNTER — Encounter (HOSPITAL_COMMUNITY): Payer: Self-pay

## 2020-09-10 DIAGNOSIS — M7989 Other specified soft tissue disorders: Secondary | ICD-10-CM

## 2020-09-10 DIAGNOSIS — R200 Anesthesia of skin: Secondary | ICD-10-CM | POA: Diagnosis not present

## 2020-09-10 MED ORDER — IBUPROFEN 600 MG PO TABS
600.0000 mg | ORAL_TABLET | Freq: Four times a day (QID) | ORAL | 0 refills | Status: DC | PRN
Start: 2020-09-10 — End: 2023-12-31

## 2020-09-10 NOTE — ED Triage Notes (Signed)
Pt in with c/o knots on left leg and numbness in her last 3 toes on the left foot  Pt also c/o leg pain for the past few days  Pt has been using ibuprofen with no relief

## 2020-09-10 NOTE — Discharge Instructions (Addendum)
Gentle range of motion exercises Take medications as prescribed If you have worsening pain please return to the urgent care We will call you with recommendations if labs are abnormal. Continue icing of the affected area.

## 2020-09-12 NOTE — ED Provider Notes (Signed)
[facility]    CSN: [id number] Arrival date & time: 09/10/20  1003      History   Chief Complaint Chief Complaint  Patient presents with  . knot on leg   . Leg Pain    HPI [name] Hendrix is a 35 y.o. female to the urgent care with pain on the medial aspect of the right thigh for about 3 weeks duration. About a few weeks ago ,patient's thigh was caught in a closing car door.  Patient has advised the patient taking over-the-counter pain medications with minimal relief.  Patient endorses worsening pain over the past 3 days.  She is also noticed a firm swelling on the inner aspect of the right thigh.  No bleeding.  No fever or chills. Patient also complains of numbness in the left foot.  No pain or discomfort in the sole of the left foot.  No trauma to the left leg or foot.  No fever or chills. Marland Kitchen   HPI  Past Medical History:  Diagnosis Date  . Hypertension   . Kidney stones   . Placenta previa     Patient Active Problem List   Diagnosis Date Noted  . S/P cesarean section 06/18/2013  . Placenta previa antepartum in third trimester 05/03/2013  . Placenta previa 04/19/2013  . Vaginal bleeding in pregnancy 03/25/2013    Past Surgical History:  Procedure Laterality Date  . CESAREAN SECTION    . CESAREAN SECTION N/A 06/18/2013   Procedure: CESAREAN SECTION;  Surgeon: [name], MD;  Location: [facility];  Service: Obstetrics;  Laterality: N/A;  . CESAREAN SECTION WITH BILATERAL TUBAL LIGATION Bilateral 06/18/2013   Procedure: REPEAT CESAREAN SECTION WITH BILATERAL TUBAL LIGATION;  Surgeon: [name], MD;  Location: [facility];  Service: Obstetrics;  Laterality: Bilateral;    OB History    Gravida  4   Para  3   Term  2   Preterm  1   AB  1   Living  3     SAB  1   IAB      Ectopic      Multiple      Live Births  3            Home Medications    Prior to Admission medications   Medication Sig Start Date End Date Taking?  Authorizing Provider  ibuprofen (ADVIL) 600 MG tablet Take 1 tablet (600 mg total) by mouth every 6 (six) hours as needed. 09/10/20  Yes Fredrick Geoghegan, Britta Mccreedy, MD  albuterol (VENTOLIN HFA) 108 (90 Base) MCG/ACT inhaler Inhale 2 puffs into the lungs every 4 (four) hours as needed for wheezing or shortness of breath. 06/09/19   Lawyer, Cristal Deer, PA-C  promethazine (PHENERGAN) 25 MG tablet Take 1 tablet (25 mg total) by mouth every 6 (six) hours as needed for nausea or vomiting. Patient not taking: Reported on 08/19/2019 06/09/19 09/10/20  Charlestine Night, PA-C    Family History Family History  Problem Relation Age of Onset  . Diabetes Maternal Grandfather   . Hypertension Maternal Grandfather     Social History Social History   Tobacco Use  . Smoking status: Former Smoker    Packs/day: 0.25    Years: 14.00    Pack years: 3.50    Types: Cigarettes  . Smokeless tobacco: Former Engineer, water Use Topics  . Alcohol use: No  . Drug use: No     Allergies   Hydromet [hydrocodone bit-homatrop mbr]  Review of Systems Review of Systems  Gastrointestinal: Negative.   Genitourinary: Negative.   Musculoskeletal: Positive for myalgias. Negative for arthralgias and joint swelling.  Neurological: Negative.      Physical Exam Triage Vital Signs ED Triage Vitals  Enc Vitals Group     BP 09/10/20 1107 126/78     Pulse Rate 09/10/20 1107 89     Resp 09/10/20 1107 18     Temp 09/10/20 1107 98.7 F (37.1 C)     Temp src --      SpO2 09/10/20 1107 100 %     Weight --      Height --      Head Circumference --      Peak Flow --      Pain Score 09/10/20 1105 5     Pain Loc --      Pain Edu? --      Excl. in GC? --    No data found.  Updated Vital Signs BP 126/78   Pulse 89   Temp 98.7 F (37.1 C)   Resp 18   LMP 09/10/2020 (Exact Date)   SpO2 100%   Visual Acuity Right Eye Distance:   Left Eye Distance:   Bilateral Distance:    Right Eye Near:   Left Eye Near:     Bilateral Near:     Physical Exam Vitals and nursing note reviewed.  Cardiovascular:     Rate and Rhythm: Normal rate and regular rhythm.     Pulses: Normal pulses.     Heart sounds: Normal heart sounds.  Musculoskeletal:        General: Normal range of motion.     Comments: Tenderness with firmness in the left distal thigh.  Redness is on the medial aspect in the supracondylar area.  No erythema.  No fluctuance.  It is not attached to underlying tissue.  It is very mobile.      UC Treatments / Results  Labs (all labs ordered are listed, but only abnormal results are displayed) Labs Reviewed - No data to display  EKG   Radiology No results found.  Procedures Procedures (including critical care time)  Medications Ordered in UC Medications - No data to display  Initial Impression / Assessment and Plan / UC Course  I have reviewed the triage vital signs and the nursing notes.  Pertinent labs & imaging results that were available during my care of the patient were reviewed by me and considered in my medical decision making (see chart for details).     1.  Fat necrosis secondary to trauma: Tylenol/Motrin as needed for pain I reassured the patient that this will resolve over time If she experiences worsening swelling, pain, redness she is advised to return to the urgent care She should continue icing the area involved  2.  Numbness in the left throat, This is localized Patient is advised to monitor and return to urgent care if numbness is worsening Final Clinical Impressions(s) / UC Diagnoses   Final diagnoses:  Fat necrosis due to trauma  Numbness of toes     Discharge Instructions     Gentle range of motion exercises Take medications as prescribed If you have worsening pain please return to the urgent care We will call you with recommendations if labs are abnormal. Continue icing of the affected area.   ED Prescriptions    Medication Sig Dispense Auth.  Provider   ibuprofen (ADVIL) 600 MG tablet Take 1 tablet (600  mg total) by mouth every 6 (six) hours as needed. 30 tablet Keinan Brouillet, Britta Mccreedy, MD     PDMP not reviewed this encounter.   Merrilee Jansky, MD 09/12/20 [phone]

## 2020-09-28 ENCOUNTER — Encounter (HOSPITAL_COMMUNITY): Payer: Self-pay

## 2020-09-28 ENCOUNTER — Ambulatory Visit (HOSPITAL_COMMUNITY)
Admission: EM | Admit: 2020-09-28 | Discharge: 2020-09-28 | Disposition: A | Discharge status: Home / Self Care | Payer: Medicaid Other | Attending: Emergency Medicine | Admitting: Emergency Medicine

## 2020-09-28 DIAGNOSIS — T63441A Toxic effect of venom of bees, accidental (unintentional), initial encounter: Secondary | ICD-10-CM

## 2020-09-28 MED ORDER — HYDROCORTISONE 1 % EX CREA
TOPICAL_CREAM | CUTANEOUS | 0 refills | Status: AC
Start: 2020-09-28 — End: ?

## 2020-09-28 MED ORDER — DIPHENHYDRAMINE HCL 12.5 MG/5ML PO ELIX
50.0000 mg | ORAL_SOLUTION | Freq: Once | ORAL | Status: AC
  Administered 2020-09-28: 50 mg via ORAL

## 2020-09-28 MED ORDER — DIPHENHYDRAMINE HCL 12.5 MG/5ML PO ELIX
ORAL_SOLUTION | ORAL | Status: AC
  Filled 2020-09-28: qty 20

## 2020-09-28 NOTE — ED Triage Notes (Signed)
Pt in with c/o bee sting to right upper thigh and back that occurred today   Pt denies any sob or throat itchiness

## 2020-09-28 NOTE — Discharge Instructions (Addendum)
Can take 50 mg of benadryl every 6 hours starting at 6 pm, this will make you drowsy  Can use 10 mg of Claritin daily if needing to go to work or do activities, this will not make you drowsy   Can use hydrocortisone twice a day on the site   Can stop use of all medications once itching and site improves

## 2020-09-28 NOTE — ED Provider Notes (Signed)
[facility]    CSN: [id number] Arrival date & time: 09/28/20  1043      History   Chief Complaint Chief Complaint  Patient presents with  . Insect Bite    HPI [name] Hendrix is a 35 y.o. female.   Patient presents with bee sting to lateral right thigh that occurred this morning, area is pruritic and swollen. Denies shortness of breath, difficult breathing, itchy throat, cough, chest pain or tightness. Has attempted use of tylenol and non medicated cream with no relief. No history of anaphylactic  reactions.   Past Medical History:  Diagnosis Date  . Hypertension   . Kidney stones   . Placenta previa     Patient Active Problem List   Diagnosis Date Noted  . S/P cesarean section 06/18/2013  . Placenta previa antepartum in third trimester 05/03/2013  . Placenta previa 04/19/2013  . Vaginal bleeding in pregnancy 03/25/2013    Past Surgical History:  Procedure Laterality Date  . CESAREAN SECTION    . CESAREAN SECTION N/A 06/18/2013   Procedure: CESAREAN SECTION;  Surgeon: [name], MD;  Location: [facility];  Service: Obstetrics;  Laterality: N/A;  . CESAREAN SECTION WITH BILATERAL TUBAL LIGATION Bilateral 06/18/2013   Procedure: REPEAT CESAREAN SECTION WITH BILATERAL TUBAL LIGATION;  Surgeon: [name], MD;  Location: [facility];  Service: Obstetrics;  Laterality: Bilateral;    OB History    Gravida  4   Para  3   Term  2   Preterm  1   AB  1   Living  3     SAB  1   IAB      Ectopic      Multiple      Live Births  3            Home Medications    Prior to Admission medications   Medication Sig Start Date End Date Taking? Authorizing Provider  hydrocortisone cream 1 % Apply to affected area 2 times daily 09/28/20  Yes Manuel Dall R, NP  albuterol (VENTOLIN HFA) 108 (90 Base) MCG/ACT inhaler Inhale 2 puffs into the lungs every 4 (four) hours as needed for wheezing or shortness of breath. 06/09/19   Lawyer, Cristal Deer,  PA-C  ibuprofen (ADVIL) 600 MG tablet Take 1 tablet (600 mg total) by mouth every 6 (six) hours as needed. 09/10/20   Lamptey, Britta Mccreedy, MD  promethazine (PHENERGAN) 25 MG tablet Take 1 tablet (25 mg total) by mouth every 6 (six) hours as needed for nausea or vomiting. Patient not taking: Reported on 08/19/2019 06/09/19 09/10/20  Charlestine Night, PA-C    Family History Family History  Problem Relation Age of Onset  . Diabetes Maternal Grandfather   . Hypertension Maternal Grandfather     Social History Social History   Tobacco Use  . Smoking status: Former Smoker    Packs/day: 0.25    Years: 14.00    Pack years: 3.50    Types: Cigarettes  . Smokeless tobacco: Former Engineer, water Use Topics  . Alcohol use: No  . Drug use: No     Allergies   Hydromet [hydrocodone bit-homatrop mbr]   Review of Systems Review of Systems  Constitutional: Negative.   Respiratory: Negative.   Skin: Positive for rash. Negative for color change, pallor and wound.  Allergic/Immunologic: Negative.   Neurological: Negative.      Physical Exam Triage Vital Signs ED Triage Vitals  Enc Vitals Group  BP 09/28/20 1056 127/84     Pulse Rate 09/28/20 1056 82     Resp 09/28/20 1056 17     Temp 09/28/20 1056 98.4 F (36.9 C)     Temp Source 09/28/20 1056 Oral     SpO2 09/28/20 1056 100 %     Weight --      Height --      Head Circumference --      Peak Flow --      Pain Score 09/28/20 1054 7     Pain Loc --      Pain Edu? --      Excl. in GC? --    No data found.  Updated Vital Signs BP 127/84 (BP Location: Right Arm)   Pulse 82   Temp 98.4 F (36.9 C) (Oral)   Resp 17   LMP 09/10/2020 (Exact Date)   SpO2 100%   Visual Acuity Right Eye Distance:   Left Eye Distance:   Bilateral Distance:    Right Eye Near:   Left Eye Near:    Bilateral Near:     Physical Exam Constitutional:      Appearance: Normal appearance. She is normal weight.  Eyes:     Extraocular  Movements: Extraocular movements intact.  Cardiovascular:     Rate and Rhythm: Normal rate and regular rhythm.     Pulses: Normal pulses.     Heart sounds: Normal heart sounds.  Pulmonary:     Effort: Pulmonary effort is normal.  Skin:         Comments: Orange sized papular erythematous rash over right lateral thigh with mild swelling, no tenderness, discharge noted.   Neurological:     Mental Status: She is alert and oriented to person, place, and time. Mental status is at baseline.  Psychiatric:        Mood and Affect: Mood normal.        Behavior: Behavior normal.      UC Treatments / Results  Labs (all labs ordered are listed, but only abnormal results are displayed) Labs Reviewed - No data to display  EKG   Radiology No results found.  Procedures Procedures (including critical care time)  Medications Ordered in UC Medications  diphenhydrAMINE (BENADRYL) 12.5 MG/5ML elixir 50 mg (50 mg Oral Given 09/28/20 1246)    Initial Impression / Assessment and Plan / UC Course  I have reviewed the triage vital signs and the nursing notes.  Pertinent labs & imaging results that were available during my care of the patient were reviewed by me and considered in my medical decision making (see chart for details).  Bee sting reaction  1. Benadryl 50 mg solution now 2. Advised benadryl 50 mg every 6 hours today, then claritin 10 mg daily or continue use of benadryl until area clears 3. Can apply hydrocortisone directly to site bid prn  4. Strict precautions given for delayed respiratory symptoms, and when to follow up, verbalized understanding Final Clinical Impressions(s) / UC Diagnoses   Final diagnoses:  Bee sting reaction, accidental or unintentional, initial encounter     Discharge Instructions     Can take 50 mg of benadryl every 6 hours starting at 6 pm, this will make you drowsy  Can use 10 mg of Claritin daily if needing to go to work or do activities, this will  not make you drowsy   Can use hydrocortisone twice a day on the site   Can stop use of all medications  once itching and site improves    ED Prescriptions    Medication Sig Dispense Auth. Provider   hydrocortisone cream 1 % Apply to affected area 2 times daily 15 g Trease Bremner, Elita Boone, NP     PDMP not reviewed this encounter.   Valinda Hoar, NP 09/28/20 1408

## 2020-10-21 DIAGNOSIS — Z20822 Contact with and (suspected) exposure to covid-19: Secondary | ICD-10-CM | POA: Diagnosis not present

## 2022-08-14 ENCOUNTER — Other Ambulatory Visit: Payer: Self-pay

## 2022-08-14 ENCOUNTER — Emergency Department (HOSPITAL_COMMUNITY)
Admission: EM | Admit: 2022-08-14 | Discharge: 2022-08-14 | Disposition: A | Discharge status: Home / Self Care | Payer: Medicaid Other | Attending: Emergency Medicine | Admitting: Emergency Medicine

## 2022-08-14 ENCOUNTER — Encounter (HOSPITAL_COMMUNITY): Payer: Self-pay | Admitting: *Deleted

## 2022-08-14 DIAGNOSIS — K0889 Other specified disorders of teeth and supporting structures: Secondary | ICD-10-CM

## 2022-08-14 MED ORDER — CLINDAMYCIN HCL 300 MG PO CAPS: 300.0000 mg | ORAL_CAPSULE | Freq: Three times a day (TID) | ORAL | 0 refills | Status: DC

## 2022-08-14 MED ORDER — TRAMADOL HCL 50 MG PO TABS: 50.0000 mg | ORAL_TABLET | Freq: Four times a day (QID) | ORAL | 0 refills | Status: AC | PRN

## 2022-08-14 MED ORDER — CLINDAMYCIN HCL 300 MG PO CAPS: 300.0000 mg | ORAL_CAPSULE | Freq: Three times a day (TID) | ORAL | 0 refills | Status: AC

## 2022-08-14 MED ORDER — CLINDAMYCIN HCL 150 MG PO CAPS
300.0000 mg | ORAL_CAPSULE | Freq: Once | ORAL | Status: AC
  Administered 2022-08-14: 300 mg via ORAL
  Filled 2022-08-14: qty 2

## 2022-08-14 MED ORDER — TRAMADOL HCL 50 MG PO TABS
50.0000 mg | ORAL_TABLET | Freq: Once | ORAL | Status: AC
  Administered 2022-08-14: 50 mg via ORAL
  Filled 2022-08-14: qty 1

## 2022-08-14 MED ORDER — TRAMADOL HCL 50 MG PO TABS: 50.0000 mg | ORAL_TABLET | Freq: Four times a day (QID) | ORAL | 0 refills | Status: DC | PRN

## 2022-08-14 NOTE — ED Triage Notes (Signed)
Toothache for several days she has no dentist

## 2022-08-14 NOTE — ED Provider Notes (Signed)
[place] [facility] AT [facility] Provider Note   CSN: [id number] Arrival date & time: 08/14/22  1539     History  Chief Complaint  Patient presents with   Dental Pain    [name] Hendrix is a 37 y.o. female.  Patient complains of left upper dental pain.  Patient reports her dentist has told her that she needs to have tooth removed.  Patient reports that she has been taking medication without relief.  Patient is requesting antibiotics she reports the area is more swollen than before  The history is provided by the patient. No language interpreter was used.  Dental Pain Location:  Upper Quality:  Aching Severity:  Moderate Timing:  Constant Progression:  Worsening Chronicity:  New Previous work-up:  Dental exam Relieved by:  Nothing Worsened by:  Nothing Ineffective treatments:  None tried      Home Medications Prior to Admission medications   Medication Sig Start Date End Date Taking? Authorizing Provider  albuterol (VENTOLIN HFA) 108 (90 Base) MCG/ACT inhaler Inhale 2 puffs into the lungs every 4 (four) hours as needed for wheezing or shortness of breath. 06/09/19   Lawyer, Cristal Deer, PA-C  clindamycin (CLEOCIN) 300 MG capsule Take 1 capsule (300 mg total) by mouth 3 (three) times daily for 10 days. 08/14/22 08/24/22  Elson Areas, PA-C  hydrocortisone cream 1 % Apply to affected area 2 times daily 09/28/20   Valinda Hoar, NP  ibuprofen (ADVIL) 600 MG tablet Take 1 tablet (600 mg total) by mouth every 6 (six) hours as needed. 09/10/20   Lamptey, Britta Mccreedy, MD  traMADol (ULTRAM) 50 MG tablet Take 1 tablet (50 mg total) by mouth every 6 (six) hours as needed. 08/14/22 08/14/23  Elson Areas, PA-C  promethazine (PHENERGAN) 25 MG tablet Take 1 tablet (25 mg total) by mouth every 6 (six) hours as needed for nausea or vomiting. Patient not taking: Reported on 08/19/2019 06/09/19 09/10/20  Charlestine Night, PA-C      Allergies    Hydromet  [hydrocodone bit-homatrop mbr]    Review of Systems   Review of Systems  HENT:  Positive for dental problem.   All other systems reviewed and are negative.   Physical Exam Updated Vital Signs BP 128/78 (BP Location: Right Arm)   Pulse 85   Temp 98 F (36.7 C) (Oral)   Resp 15   Ht 5' (1.524 m)   Wt 57.7 kg   LMP 07/26/2022   SpO2 100%   BMI 24.84 kg/m  Physical Exam Vitals and nursing note reviewed.  Constitutional:      Appearance: She is well-developed.  HENT:     Head: Normocephalic.     Mouth/Throat:     Comments: No obvious abscess no facial swelling Pulmonary:     Effort: Pulmonary effort is normal.  Abdominal:     General: There is no distension.  Musculoskeletal:     Cervical back: Normal range of motion.  Neurological:     Mental Status: She is alert and oriented to person, place, and time.     ED Results / Procedures / Treatments   Labs (all labs ordered are listed, but only abnormal results are displayed) Labs Reviewed - No data to display  EKG None  Radiology No results found.  Procedures Procedures    Medications Ordered in ED Medications  clindamycin (CLEOCIN) capsule 300 mg (300 mg Oral Given 08/14/22 1758)  traMADol (ULTRAM) tablet 50 mg (50 mg Oral Given 08/14/22  1758)    ED Course/ Medical Decision Making/ A&P                             Medical Decision Making Patient complains of dental pain.  Patient reports that she has lost the establishment with her dentist.  Risk Prescription drug management. Risk Details: And is given a prescription for clindamycin she is given a prescription for tramadol.  Patient is given the phone number for Dr. Mia Creek the dentist who is on call to schedule follow-up evaluation.           Final Clinical Impression(s) / ED Diagnoses Final diagnoses:  Pain, dental    Rx / DC Orders ED Discharge Orders          Ordered    clindamycin (CLEOCIN) 300 MG capsule  3 times daily,   Status:   Discontinued        08/14/22 1715    traMADol (ULTRAM) 50 MG tablet  Every 6 hours PRN,   Status:  Discontinued        08/14/22 1715    clindamycin (CLEOCIN) 300 MG capsule  3 times daily        08/14/22 1921    traMADol (ULTRAM) 50 MG tablet  Every 6 hours PRN        08/14/22 1921           An After Visit Summary was printed and given to the patient.    [name] Hendrix 08/14/22 2234    Nira Conn, MD 08/15/22 [phone]

## 2022-08-14 NOTE — Discharge Instructions (Signed)
Call the dentist to be seen for evaluation

## 2022-08-14 NOTE — ED Provider Triage Note (Signed)
Emergency Medicine Provider Triage Evaluation Note  [name] Hendrix , a 37 y.o. female  was evaluated in triage.  Dental pain for a week.  Refractory to ibuprofen and multiple Orajel's.  Unable to get in with a dentist  Review of Systems  Positive:  Negative:   Physical Exam  BP (!) 132/91 (BP Location: Right Arm)   Pulse 88   Resp 16   Ht 5' (1.524 m)   Wt 57.7 kg   LMP 07/26/2022   SpO2 100%   BMI 24.84 kg/m  Gen:   Awake, no distress   Resp:  Normal effort  MSK:   Moves extremities without difficulty  Other:  Multiple teeth broken off and in poor dentition.  No obvious abscess, airway clear, tolerating secretions  Medical Decision Making  Medically screening exam initiated at 4:09 PM.  Appropriate orders placed.  Sidda Humm was informed that the remainder of the evaluation will be completed by another provider, this initial triage assessment does not replace that evaluation, and the importance of remaining in the ED until their evaluation is complete.     Saddie Benders, PA-C 08/14/22 1610

## 2022-11-08 DIAGNOSIS — R030 Elevated blood-pressure reading, without diagnosis of hypertension: Secondary | ICD-10-CM | POA: Diagnosis not present

## 2022-11-08 DIAGNOSIS — K0889 Other specified disorders of teeth and supporting structures: Secondary | ICD-10-CM | POA: Diagnosis not present

## 2022-11-09 DIAGNOSIS — K0889 Other specified disorders of teeth and supporting structures: Secondary | ICD-10-CM | POA: Diagnosis not present

## 2022-12-18 DIAGNOSIS — R051 Acute cough: Secondary | ICD-10-CM | POA: Diagnosis not present

## 2022-12-18 DIAGNOSIS — K0889 Other specified disorders of teeth and supporting structures: Secondary | ICD-10-CM | POA: Diagnosis not present

## 2023-01-17 DIAGNOSIS — M436 Torticollis: Secondary | ICD-10-CM | POA: Diagnosis not present

## 2023-01-19 DIAGNOSIS — Z758 Other problems related to medical facilities and other health care: Secondary | ICD-10-CM | POA: Diagnosis not present

## 2023-01-19 DIAGNOSIS — M542 Cervicalgia: Secondary | ICD-10-CM | POA: Diagnosis not present

## 2023-01-23 DIAGNOSIS — R200 Anesthesia of skin: Secondary | ICD-10-CM | POA: Diagnosis not present

## 2023-01-23 DIAGNOSIS — M542 Cervicalgia: Secondary | ICD-10-CM | POA: Diagnosis not present

## 2023-01-24 DIAGNOSIS — Z7689 Persons encountering health services in other specified circumstances: Secondary | ICD-10-CM | POA: Diagnosis not present

## 2023-01-24 DIAGNOSIS — I10 Essential (primary) hypertension: Secondary | ICD-10-CM | POA: Diagnosis not present

## 2023-01-24 DIAGNOSIS — D649 Anemia, unspecified: Secondary | ICD-10-CM | POA: Diagnosis not present

## 2023-01-26 DIAGNOSIS — D649 Anemia, unspecified: Secondary | ICD-10-CM | POA: Diagnosis not present

## 2023-02-12 DIAGNOSIS — K047 Periapical abscess without sinus: Secondary | ICD-10-CM | POA: Diagnosis not present

## 2023-08-31 DIAGNOSIS — M25562 Pain in left knee: Secondary | ICD-10-CM | POA: Diagnosis not present

## 2023-08-31 DIAGNOSIS — M79605 Pain in left leg: Secondary | ICD-10-CM | POA: Diagnosis not present

## 2023-08-31 DIAGNOSIS — M545 Low back pain, unspecified: Secondary | ICD-10-CM | POA: Diagnosis not present

## 2023-11-01 DIAGNOSIS — R051 Acute cough: Secondary | ICD-10-CM | POA: Diagnosis not present

## 2023-11-01 DIAGNOSIS — Z20822 Contact with and (suspected) exposure to covid-19: Secondary | ICD-10-CM | POA: Diagnosis not present

## 2023-11-13 DIAGNOSIS — Z20822 Contact with and (suspected) exposure to covid-19: Secondary | ICD-10-CM | POA: Diagnosis not present

## 2023-11-13 DIAGNOSIS — R5383 Other fatigue: Secondary | ICD-10-CM | POA: Diagnosis not present

## 2023-11-13 DIAGNOSIS — R112 Nausea with vomiting, unspecified: Secondary | ICD-10-CM | POA: Diagnosis not present

## 2023-12-31 ENCOUNTER — Inpatient Hospital Stay (HOSPITAL_COMMUNITY)
Admission: AD | Admit: 2023-12-31 | Discharge: 2023-12-31 | Disposition: A | Discharge status: Home / Self Care | Attending: Obstetrics & Gynecology | Admitting: Obstetrics & Gynecology

## 2023-12-31 ENCOUNTER — Other Ambulatory Visit: Payer: Self-pay

## 2023-12-31 DIAGNOSIS — R030 Elevated blood-pressure reading, without diagnosis of hypertension: Secondary | ICD-10-CM | POA: Insufficient documentation

## 2023-12-31 DIAGNOSIS — Z3202 Encounter for pregnancy test, result negative: Secondary | ICD-10-CM

## 2023-12-31 LAB — HCG, QUANTITATIVE, PREGNANCY: hCG, Beta Chain, Quant, S: <1 m[IU]/mL (ref <5)

## 2023-12-31 LAB — POCT PREGNANCY, URINE: Preg Test, Ur: NEGATIVE

## 2023-12-31 MED ORDER — IBUPROFEN 800 MG PO TABS: 800.0000 mg | ORAL_TABLET | Freq: Three times a day (TID) | ORAL | 3 refills | Status: AC | PRN

## 2023-12-31 MED ORDER — ACETAMINOPHEN 500 MG PO TABS: 1000.0000 mg | ORAL_TABLET | Freq: Four times a day (QID) | ORAL | 1 refills | Status: AC | PRN

## 2023-12-31 NOTE — MAU Note (Signed)
 [name] Hendrix is a 38 y.o. at Unknown here in MAU reporting: she thinks she may have had a miscarriage.  States began having light VB on Friday but has gotten heavier with clots. States changing sanitary napkin every 2-3 hours.  Also c/o abdominal cramping, hasn't taken any meds to treat.  Reports had a +HPT 2.5 weeks ago.    LMP: 12/06/2023,  Onset of complaint: today Pain score: 8 There were no vitals filed for this visit.   FHT: NA  Lab orders placed from triage: UPT

## 2023-12-31 NOTE — MAU Provider Note (Signed)
 None     S [name] is a 38 y.o. [phone] female at Unknown who presents to MAU today with complaint of vaginal bleeding. She started having light vaginal bleeding on 12/29/2023 which became heavier today. The reports changing her pad every 2 or 3 hours. She has associated abdominal cramping, currently 8/10. She has not tried any medication for it. She thinks that she may be having a miscarriage because she had a positive home pregnancy test a little over 2 weeks ago. LMP 12/06/2023. She has not had any clinical confirmation of pregnancy.    Pertinent items noted in HPI and remainder of comprehensive ROS otherwise negative.   O BP (!) 166/103 (BP Location: Right Arm)   Pulse 88   Temp 98.2 F (36.8 C) (Oral)   Resp 20   Ht 5' (1.524 m)   Wt 58.6 kg   LMP 12/06/2023   SpO2 100%   BMI 25.21 kg/m  Physical Exam Vitals reviewed.  Constitutional:      General: She is not in acute distress.    Appearance: She is well-developed. She is not diaphoretic.  HENT:     Head: Normocephalic and atraumatic.  Eyes:     General: No scleral icterus. Pulmonary:     Effort: Pulmonary effort is normal. No respiratory distress.  Musculoskeletal:     Cervical back: Normal range of motion.  Skin:    General: Skin is warm and dry.  Neurological:     Mental Status: She is alert.     Coordination: Coordination normal.    Results for orders placed or performed during the hospital encounter of 12/31/23 (from the past 24 hours)  Pregnancy, urine POC     Status: None   Collection Time: 12/31/23  4:05 PM  Result Value Ref Range   Preg Test, Ur NEGATIVE NEGATIVE  hCG, quantitative, pregnancy     Status: None   Collection Time: 12/31/23  4:18 PM  Result Value Ref Range   hCG, Beta Chain, Quant, S <1 <5 mIU/mL    MDM: MAU Course: -Elevated BP. Review in her chart shows BP has been elevated several other times, recommend follow up with primary care. -Urine pregnancy test negative. Will  confirm with hCG given report of positive home pregnancy test.  -hCG <1, negative for pregnancy.  A 1. Negative pregnancy test (Primary) - Discharge patient  2. Elevated blood-pressure reading without diagnosis of hypertension  Medical screening exam complete  P Discharge from MAU in stable condition with bleeding precautions. Follow up with PCP regarding elevated blood pressure and possible essential hypertension.  No future appointments. Allergies as of 12/31/2023   No Known Allergies      Medication List     TAKE these medications    albuterol  108 (90 Base) MCG/ACT inhaler Commonly known as: VENTOLIN  HFA Inhale 2 puffs into the lungs every 4 (four) hours as needed for wheezing or shortness of breath.   hydrocortisone  cream 1 % Apply to affected area 2 times daily   ibuprofen  600 MG tablet Commonly known as: ADVIL  Take 1 tablet (600 mg total) by mouth every 6 (six) hours as needed.        [name] DELENA Sear, PA

## 2023-12-31 NOTE — Discharge Instructions (Signed)
 Your blood pressure was quite high today. Please follow up with your primary care provider regarding your blood pressure.
